# Patient Record
Sex: Male | Born: 1935 | Race: White | Hispanic: No | Marital: Married
Health system: Midwestern US, Community
[De-identification: ages and names within clinical notes are randomized; demographics above are authoritative.]

## PROBLEM LIST (undated history)

## (undated) DIAGNOSIS — A419 Sepsis, unspecified organism: Secondary | ICD-10-CM

## (undated) DIAGNOSIS — E785 Hyperlipidemia, unspecified: Secondary | ICD-10-CM

## (undated) DIAGNOSIS — C61 Malignant neoplasm of prostate: Secondary | ICD-10-CM

## (undated) DIAGNOSIS — I252 Old myocardial infarction: Secondary | ICD-10-CM

## (undated) DIAGNOSIS — N390 Urinary tract infection, site not specified: Secondary | ICD-10-CM

## (undated) DIAGNOSIS — G459 Transient cerebral ischemic attack, unspecified: Secondary | ICD-10-CM

## (undated) DIAGNOSIS — S065XAA Traumatic subdural hemorrhage with loss of consciousness status unknown, initial encounter (HCC): Principal | ICD-10-CM

## (undated) DIAGNOSIS — M25562 Pain in left knee: Secondary | ICD-10-CM

## (undated) HISTORY — DX: Transient cerebral ischemic attack, unspecified: G45.9

## (undated) HISTORY — PX: CORONARY ARTERY BYPASS GRAFT: SHX141

## (undated) HISTORY — DX: Old myocardial infarction: I25.2

## (undated) HISTORY — DX: Sepsis, unspecified organism: A41.9

## (undated) HISTORY — DX: Urinary tract infection, site not specified: N39.0

## (undated) HISTORY — DX: Malignant neoplasm of prostate: C61

## (undated) HISTORY — DX: Hyperlipidemia, unspecified: E78.5

## (undated) LAB — EKG 12-LEAD

---

## 2010-05-30 ENCOUNTER — Inpatient Hospital Stay: Admit: 2010-11-17 | Disposition: A | Discharge status: Home / Self Care | Arrived: WI

## 2010-05-30 LAB — CARDIAC ED PROFILE
BUN: 17 mg/dL (ref 9–23)
CK-MB: 2.1 ng/mL (ref 0.0–5.0)
CO2: 28 mmol/L (ref 20–31)
Calcium: 9.2 mg/dL (ref 8.6–10.5)
Chloride: 109 mmol/L (ref 99–109)
Creatinine: 1 mg/dL (ref 0.7–1.3)
Glucose: 114 mg/dL — ABNORMAL HIGH (ref 70–110)
HCT: 43.5 % (ref 37.0–54.0)
HGB: 14.9 g/dL (ref 12.5–16.5)
MCH: 30.8 pg (ref 26.0–35.0)
MCHC: 34.3 % (ref 32.0–34.5)
MCV: 89.8 fL (ref 80.0–99.9)
MPV: 10.7 fL (ref 7.0–12.0)
Platelets: 135 E9/L (ref 130–450)
Potassium: 3.9 mmol/L (ref 3.5–5.5)
RBC: 4.84 E12/L (ref 3.80–5.80)
RDW: 13.9 fL (ref 11.5–15.0)
Sodium: 142 mmol/L (ref 132–146)
Total CK: 89 U/L (ref 32–294)
Troponin I: 0.01 ng/mL (ref 0.00–0.40)
WBC: 7.9 E9/L (ref 4.5–11.5)

## 2010-05-30 LAB — GFR CALCULATED: Gfr Calculated: >60 mL/min/{1.73_m2} (ref >=60)

## 2010-05-30 MED ORDER — CLOPIDOGREL BISULFATE 75 MG PO TABS
75 MG | ORAL_TABLET | Freq: Every day | ORAL | Status: AC
Start: 2010-05-30 — End: 2011-05-30

## 2010-05-30 MED ORDER — LABETALOL HCL 5 MG/ML IV SOLN
5 MG/ML | Freq: Once | INTRAVENOUS | Status: DC
Start: 2010-05-30 — End: 2010-05-30

## 2010-05-30 MED ADMIN — sodium chloride (PF) 0.9 % injection: @ 14:00:00

## 2010-05-30 MED FILL — NORMAL SALINE FLUSH 0.9 % IJ SOLN: 0.9 % | INTRAMUSCULAR | Qty: 10

## 2010-05-30 NOTE — ED Notes (Signed)
Pt back from Cat Scan. Stable condition    Vaughan Basta, RN  05/30/10 1044

## 2010-05-30 NOTE — Discharge Instructions (Signed)
Transient Ischemic Attack     A transient ischemic attack (TIA) is a "warning stroke" that causes stroke-like symptoms. Unlike a stroke, a TIA does not cause permanent damage to the brain. The symptoms of a TIA can happen very fast and do not last long. It is important to know the symptoms of a TIA and what to do. This can help prevent a major stroke or death.  CAUSES   A TIA is caused by a temporary blockage in an artery in the brain or neck (carotid artery). The blockage does not allow the brain to get the blood supply it needs and can cause different symptoms. The blockage can be caused by either:  l A blood clot.  l Fatty buildup (plaque) in a neck or brain artery.       SYMPTOMS  TIA symptoms are the same as a stroke but are temporary. Symptoms can include sudden:   Numbness or weakness on one side of the body. Especially to the:  l Face.  l Arm.  l Leg.     Trouble speaking, thinking, or confusion.     Change in vision, such as trouble seeing in one or both eyes.     Dizziness, loss of balance, or difficulty walking.   Severe headache.       ANY OF THESE SYMPTOMS MAY REPRESENT A SERIOUS PROBLEM THAT IS AN EMERGENCY. Do not wait to see if the symptoms will go away. Get medical help at once. Call your local emergency service (911 in [place]. DO NOT drive yourself to the hospital.     TIA RISK FACTORS  Risk factors can increase the risk of developing a TIA. These can include.     High blood pressure (hypertension).   High cholesterol (hyperlipidemia).   Heart disease (atherosclerosis).     Smoking.     Diabetes.   Abnormal heart rhythm (atrial fibrillation).   Family history of a stroke or heart attack.     Use of oral contraceptives (especially when combined with smoking).     DIAGNOSIS   A TIA can be diagnosed based on your:  l Symptoms.    l History.  l Risk factors.     Tests that can help diagnose the symptoms of a TIA include:  l CT or MRI scan. These tests can provide detailed  images of the brain.    l Carotid ultrasound. This test looks to see if there are blockages in the carotid arteries of your neck.    l Arteriography. A thin, small flexible tube (catheter) is inserted through a small cut (incision) in your groin. The catheter is threaded to your carotid or vertebral artery. A dye is then injected into the catheter. The dye highlights the arteries in your brain and allows your caregiver to look for narrowing or blockages that can cause a TIA.       TREATMENT  Based on the cause of a TIA, treatment options can vary. Treatment is important to help prevent a stroke. Treatment options can include:   Medication. Such as:  l Clot-busting medicine.    l Anti-platelet medicine.  l Blood pressure medicine.    l Blood thinner medicine.     Surgery:  l Carotid endarterectomy. The carotid arteries are the arteries that supply the head and neck with oxygenated blood. This surgery can help remove fatty deposits (plaque) in the carotid arteries.     Angioplasty and stenting. This surgery uses a balloon to dilate  a blocked artery in the brain. A stent is a small, metal mesh tube that can help keep an artery open.       HOME CARE INSTRUCTIONS   It is important to take all medicine as told by your caregiver. If the medicine has side effects that affect you negatively, tell your caregiver right away. Do not stop taking medicine unless told by your caregiver. Some medicines may need to be changed to better treat your condition.     Do not smoke. Talk to your caregiver on how to quit smoking.     Eat a diet high in fruits, vegetables and lean meat. Avoid a high fat, high salt diet. A dietician can you help you make healthy food choices.     Maintain a healthy weight. Develop an exercise plan approved by your caregiver.       SEEK IMMEDIATE MEDICAL CARE IF:   You develop weakness or numbness on one side of your body.     You have problems thinking, speaking, or feel confused.     You have vision  changes.   You feel dizzy, have trouble walking, or lose your balance.     You develop a severe headache.     MAKE SURE YOU:   Understand these instructions.   Will watch your condition.   Will get help right away if you are not doing well or get worse.     Document Released: 06/07/2005  Document Re-Released: 11/22/2009  [facility] Patient Information 2011 [place].

## 2010-05-30 NOTE — ED Provider Notes (Signed)
HPI Comments: Patient states that he had a 5 min episode of garbled speech on 9/17.  Completely resolved now.  No prior history.  Denies weakness, numbness, vision changes, syncope or difficulty walking.    Patient is a 76 y.o. male presenting with Acute Neurological Problem. The history is provided by the patient and the spouse.   Cerebrovascular Accident  This is a new problem. The current episode started 2 days ago. The problem has been resolved. Associated symptoms include headaches. Pertinent negatives include no chest pain, no abdominal pain and no shortness of breath. Nothing aggravates the symptoms. Nothing relieves the symptoms. He has tried ASA for the symptoms.       Review of Systems   Constitutional: Negative for fever and chills.   HENT: Negative for ear pain, sore throat and sinus pressure.    Eyes: Negative for pain, discharge, redness and visual disturbance.   Respiratory: Negative for cough, shortness of breath and wheezing.    Cardiovascular: Negative for chest pain.   Gastrointestinal: Negative for nausea, vomiting, abdominal pain and diarrhea.   Genitourinary: Negative for dysuria and frequency.   Musculoskeletal: Negative for back pain and arthralgias.   Skin: Negative for rash and wound.   Neurological: Positive for headaches. Negative for weakness.   Hematological: Negative for adenopathy.   All other systems reviewed and are negative.        Physical Exam   Nursing note and vitals reviewed.  Constitutional: He is oriented to person, place, and time. He appears well-developed and well-nourished.   HENT:   Head: Normocephalic and atraumatic.   Eyes: Pupils are equal, round, and reactive to light.   Neck: Normal range of motion. Neck supple.   Cardiovascular: Normal rate, regular rhythm and normal heart sounds.    No murmur heard.  Pulmonary/Chest: Effort normal and breath sounds normal.   Abdominal: Soft. Bowel sounds are normal. No tenderness. He has no rebound and no guarding.    Musculoskeletal: He exhibits no edema.   Neurological: He is alert and oriented to person, place, and time.        Neurovascularly intact.     Skin: Skin is warm and dry.       Procedures    MDM    Labs      Radiology       EKG Interpretation  EKG Interpretation    Interpreted by emergency department physician    Rhythm: sinus bradycardia  Rate: 50-60  Axis: normal  Ectopy: none  Conduction: right bundle branch block (complete)  ST Segments: nonspecific changes  T Waves: non specific changes  Q Waves: III    Clinical Impression: sinus bradycardia,RBBB.  No old for comparison  Time: 11:33 AM    Re-evaluation.  Patient???s symptoms show no change  Repeat physical examination is not changed  Continues to feel well.      Sharee Pimple Avril Busser  --------------------------------------------- PAST HISTORY ---------------------------------------------  Past Medical History: has a past medical history of CAD (coronary artery disease); Cholesterol serum increased; Hyperlipidemia; and ACUTE MI.    Past Surgical History: has past surgical history that includes Cardiac surgery; Coronary angioplasty; and Coronary angioplasty.    Social History:  reports that he has quit smoking. He quit smokeless tobacco use about 14 years ago.  He reports that he does not currently drink alcohol.    Family History: family history is not on file.     The patient???s home medications have been reviewed.    Allergies: Review  of patient's allergies indicates no known allergies.    -------------------------------------------------- RESULTS -------------------------------------------------  Results for orders placed during the hospital encounter of 05/30/10   CARDIAC ED PROFILE       Component Value Range    Sodium 142  132 - 146 (mmol/L)    Potassium 3.9  3.5 - 5.5 (mmol/L)    Chloride 109  99 - 109 (mmol/L)    CO2 28  20 - 31 (mmol/L)    Glucose 114 (*) 70 - 110 (mg/dL)    Bun 17  9 - 23 (mg/dL)    Creatinine 1.0  0.7 - 1.3 (mg/dL)    Calcium 9.2  8.6  - 10.5 (mg/dL)    WBC 7.9  4.5 - 11.5 (E9/L)    RBC 4.84  3.80 - 5.80 (E12/L)    HGB 14.9  12.5 - 16.5 (g/dL)    HCT 43.5  37.0 - 54.0 (%)    MCV 89.8  80.0 - 99.9 (fL)    MCH 30.8  26.0 - 35.0 (pg)    MCHC 34.3  32.0 - 34.5 (%)    RDW 13.9  11.5 - 15.0 (fL)    Platelets 135  130 - 450 (E9/L)    MPV 10.7  7.0 - 12.0 (fL)    Total CK 89  32 - 294 (U/L)    CK-MB 2.1  0.0 - 5.0 (ng/mL)    Troponin I 0.01  0.00 - 0.40 (ng/mL)   GFR CALCULATED       Component Value Range    Gfr Calculated >60  >=60 (ml/mn/1.73)     CT HEAD WO CONTRAST    Final Result:        XR CHEST AP PORTABLE    Final Result:            ------------------------- NURSING NOTES AND VITALS REVIEWED ---------------------------   The nursing notes within the ED encounter and vital signs as below have been reviewed.   BP 144/67   Pulse 57   Temp(Src) 98.1 ??F (36.7 ??C) (Oral)   Resp 16   Ht 6' 2"$  (1.88 m)   Wt 230 lb (104.327 kg)   BMI 29.53 kg/m2   SpO2 95%  Oxygen Saturation Interpretation: Normal      ------------------------------------------ PROGRESS NOTES ------------------------------------------   I have spoken with the patient and discussed today???s results, in addition to providing specific details for the plan of care and counseling regarding the diagnosis and prognosis.  Their questions are answered at this time and they are agreeable with the plan.      --------------------------------- ADDITIONAL PROVIDER NOTES ---------------------------------     Spoke with Dr. Ander Gaster for Dr Everardo Pacific.  Patient will start plavix, return here if there is a worsening or change in mental status or if he experiences stroke like symptoms.  Patient is to call the office today for fu in 2 days.     This patient is stable for discharge.  I have shared the specific conditions for return, as well as the importance of follow-up.        Sharee Pimple Jahsir Rama, DO  05/30/10 1221

## 2011-01-06 ENCOUNTER — Inpatient Hospital Stay
Admit: 2011-01-06 | Disposition: A | Discharge status: Home / Self Care | Source: Home / Self Care | Admitting: Internal Medicine

## 2011-01-06 LAB — BASIC METABOLIC PANEL
BUN: 25 mg/dL — ABNORMAL HIGH (ref 9–23)
CO2: 24 mmol/L (ref 20–31)
Calcium: 8.8 mg/dL (ref 8.6–10.5)
Chloride: 97 mmol/L — ABNORMAL LOW (ref 99–109)
Creatinine: 1.9 mg/dL — ABNORMAL HIGH (ref 0.7–1.3)
Glucose: 145 mg/dL — ABNORMAL HIGH (ref 70–110)
Potassium: 4 mmol/L (ref 3.5–5.5)
Sodium: 132 mmol/L (ref 132–146)

## 2011-01-06 LAB — CK: Total CK: 310 U/L — ABNORMAL HIGH (ref 32–294)

## 2011-01-06 LAB — LACTIC ACID: Lactic Acid: 2.7 mmol/L — ABNORMAL HIGH (ref 0.5–2.2)

## 2011-01-06 LAB — URINALYSIS WITH MICROSCOPIC
Glucose, UA: NEGATIVE mg/dL
Ketones, Urine: NEGATIVE mg/dL
Leukocyte Esterase, Urine: NEGATIVE
Nitrite, Urine: NEGATIVE
Protein, UA: 100 mg/dL — AB
Specific Gravity, UA: >=1.03 (ref <=1.035)
Urobilinogen, Urine: 0.2 E.U./dL (ref 0.2–1.0)
pH, UA: 5 (ref 5.0–8.5)

## 2011-01-06 LAB — PLTDIFFREFLEX
Absolute Baso #: 0.01 E9/L (ref 0.00–0.20)
Absolute Eos #: 0 E9/L — ABNORMAL LOW (ref 0.05–0.50)
Absolute Lymph #: 0.67 E9/L — ABNORMAL LOW (ref 1.50–4.00)
Absolute Mono #: 0.53 E9/L (ref 0.10–0.95)
Absolute Neut #: 8.3 E9/L — ABNORMAL HIGH (ref 1.80–7.30)
Basophils: 0 % (ref 0–2)
Eosinophils %: 0 % (ref 0–6)
Lymphocytes: 7 % — ABNORMAL LOW (ref 20–42)
Monocytes: 6 % (ref 2–12)
Platelet Estimate: DECREASED
Seg Neutrophils: 87 % — ABNORMAL HIGH (ref 43–80)

## 2011-01-06 LAB — CBC
HCT: 44.7 % (ref 37.0–54.0)
HGB: 15.4 g/dL (ref 12.5–16.5)
MCH: 30.4 pg (ref 26.0–35.0)
MCHC: 34.4 % (ref 32.0–34.5)
MCV: 88.3 fL (ref 80.0–99.9)
MPV: 10.5 fL (ref 7.0–12.0)
Platelets: 97 E9/L — ABNORMAL LOW (ref 130–450)
RBC: 5.06 E12/L (ref 3.80–5.80)
RDW: 13.6 fL (ref 11.5–15.0)
WBC: 9.5 E9/L (ref 4.5–11.5)

## 2011-01-06 LAB — APTT: PTT: 28.2 secs. (ref 24.5–34.6)

## 2011-01-06 LAB — LIPASE: Lipase: 30 U/L (ref 6–51)

## 2011-01-06 LAB — TROPONIN: Troponin I: 0.04 ng/mL (ref 0.00–0.40)

## 2011-01-06 LAB — PROTIME-INR
INR: 1.2
Protime: 13 secs. — ABNORMAL HIGH (ref 11.0–12.9)

## 2011-01-06 LAB — HEPATIC FUNCTION PANEL
ALT: 23 U/L (ref 10–49)
AST: 29 U/L (ref 0–33)
Albumin: 4.5 g/dL (ref 3.2–4.8)
Alkaline Phosphatase: 60 U/L (ref 45–129)
Bilirubin, Direct: 0.2 mg/dL (ref 0.0–0.2)
Bilirubin, Total: 0.6 mg/dL (ref 0.3–1.2)
Total Protein: 7.4 g/dL (ref 5.7–8.2)

## 2011-01-06 LAB — ICTOTEST, URINE: Ictotest: NEGATIVE

## 2011-01-06 LAB — POCT GLUCOSE
Glucose: 144 mg/dL
POC Glucose: 144 mg/dL — ABNORMAL HIGH (ref 70–110)

## 2011-01-06 LAB — CK-MB: CK-MB: 4.3 ng/mL (ref 0.0–5.0)

## 2011-01-06 LAB — GFR CALCULATED: Gfr Calculated: 34 mL/min/{1.73_m2} — AB (ref >=60)

## 2011-01-06 MED ORDER — IBUPROFEN 800 MG PO TABS
800 MG | Freq: Once | ORAL | Status: AC
Start: 2011-01-06 — End: 2011-01-06
  Administered 2011-01-06: 18:00:00 via ORAL

## 2011-01-06 MED ORDER — SODIUM CHLORIDE 0.9 % IV BOLUS
0.9 % | Freq: Once | INTRAVENOUS | Status: AC
Start: 2011-01-06 — End: 2011-01-06
  Administered 2011-01-06: 18:00:00 via INTRAVENOUS

## 2011-01-06 MED ADMIN — sodium chloride 0.9% bolus: INTRAVENOUS | @ 21:00:00 | NDC 00338004904

## 2011-01-06 MED ADMIN — sodium chloride 0.9% bolus: INTRAVENOUS | @ 19:00:00 | NDC 00338004904

## 2011-01-06 MED FILL — SODIUM CHLORIDE 0.9 % IV SOLN: 0.9 % | INTRAVENOUS | Qty: 1000

## 2011-01-06 MED FILL — SODIUM CHLORIDE 0.9 % IV SOLN: 0.9 % | INTRAVENOUS | Qty: 500

## 2011-01-06 MED FILL — IBUPROFEN 800 MG PO TABS: 800 MG | ORAL | Qty: 1

## 2011-01-06 NOTE — ED Notes (Signed)
Sleeping at present iv infusing well      Rebecca Eaton, RN  01/06/11 [phone]

## 2011-01-06 NOTE — ED Notes (Signed)
Completed EKG and given to Dr. Su Hilt.    Tyler Rhodes  01/06/11 1356

## 2011-01-06 NOTE — ED Notes (Signed)
Pt resting at present      Farrel Gobble, LPN  78/29/56 2130

## 2011-01-06 NOTE — ED Notes (Addendum)
Rebecca Eaton, RN  01/06/11 [phone]

## 2011-01-06 NOTE — ED Notes (Signed)
Patient to be admitted. Waiting for bed assignment from nursing supervisor.    Verita Lamb, RN  01/06/11 [phone]

## 2011-01-06 NOTE — ED Provider Notes (Signed)
Patient is a 77 y.o. male presenting with fever. The history is provided by the patient, the spouse and medical records. No language interpreter was used.   Fever  Primary symptoms of the febrile illness include fever, fatigue and myalgias. Primary symptoms do not include headaches, cough, wheezing, shortness of breath, abdominal pain, nausea, vomiting, diarrhea, dysuria, altered mental status, arthralgias or rash. The current episode started yesterday. This is a new problem.   The myalgias are not associated with weakness.       Review of Systems   Constitutional: Positive for fever and fatigue. Negative for chills.   HENT: Negative for ear pain, sore throat and sinus pressure.    Eyes: Negative for pain, discharge and redness.   Respiratory: Negative for cough, shortness of breath and wheezing.    Cardiovascular: Negative for chest pain.   Gastrointestinal: Negative for nausea, vomiting, abdominal pain and diarrhea.   Genitourinary: Negative for dysuria and frequency.   Musculoskeletal: Positive for myalgias. Negative for back pain and arthralgias.   Skin: Negative for rash and wound.   Neurological: Negative for weakness and headaches.   Hematological: Negative for adenopathy.   Psychiatric/Behavioral: Negative for altered mental status.   [all other systems reviewed and are negative        Physical Exam   [nursing notereviewed.  Constitutional: He is oriented to person, place, and time. He appears well-developed and well-nourished.   HENT:   Head: Normocephalic and atraumatic.   Eyes: Pupils are equal, round, and reactive to light.   Neck: Normal range of motion. Neck supple.   Cardiovascular: Normal rate, regular rhythm and normal heart sounds.    No murmur heard.  Pulmonary/Chest: Effort normal and breath sounds normal. No respiratory distress. He has no wheezes. He has no rales.   Abdominal: Soft. Bowel sounds are normal. No tenderness. He has no rebound and no guarding.   Musculoskeletal: He exhibits no  edema.   Neurological: He is alert and oriented to person, place, and time. No cranial nerve deficit. Coordination normal.   Skin: Skin is warm and dry.       Procedures    MDM  EKG:  This EKG is signed and interpreted by me.    Rate: 89  Rhythm: Sinus  Interpretation: NSR, left axis, RBBB, Non Specific ST Changes    Comparison: stable as comapred to patient's most recent EKG9/19/11  --------------------------------------------- PAST HISTORY ---------------------------------------------  Past Medical History:  has a past medical history of CAD (coronary artery disease); Cholesterol serum increased; Hyperlipidemia; Acute MI; Prostate cancer; and Prostate cancer.    Past Surgical History:  has past surgical history that includes Cardiac surgery; Coronary angioplasty; and Coronary angioplasty.    Social History:  reports that he has quit smoking. He quit smokeless tobacco use about 14 years ago. He reports that he does not drink alcohol.    Family History: family history is not on file.     The patient???s home medications have been reviewed.    Allergies: Review of patient's allergies indicates no known allergies.    -------------------------------------------------- RESULTS -------------------------------------------------    LABS:  Results for orders placed during the hospital encounter of 01/06/11   BASIC METABOLIC PANEL       Component Value Range    Sodium 132  132 - 146 (mmol/L)    Potassium 4.0  3.5 - 5.5 (mmol/L)    Chloride 97 (*) 99 - 109 (mmol/L)    CO2 24  20 - 31 (mmol/L)  Glucose 145 (*) 70 - 110 (mg/dL)    BUN 25 (*) 9 - 23 (mg/dL)    Creatinine 1.9 (*) 0.7 - 1.3 (mg/dL)    Calcium 8.8  8.6 - 10.5 (mg/dL)   CBC       Component Value Range    WBC 9.5  4.5 - 11.5 (E9/L)    RBC 5.06  3.80 - 5.80 (E12/L)    HGB 15.4  12.5 - 16.5 (g/dL)    HCT 16.1  09.6 - 04.5 (%)    MCV 88.3  80.0 - 99.9 (fL)    MCH 30.4  26.0 - 35.0 (pg)    MCHC 34.4  32.0 - 34.5 (%)    RDW 13.6  11.5 - 15.0 (fL)    Platelets 97 (*) 130 -  450 (E9/L)    MPV 10.5  7.0 - 12.0 (fL)   CK       Component Value Range    Total CK 310 (*) 32 - 294 (U/L)   CK-MB       Component Value Range    CK-MB 4.3  0.0 - 5.0 (ng/mL)   TROPONIN I       Component Value Range    Troponin I 0.04  0.00 - 0.40 (ng/mL)   PROTIME-INR       Component Value Range    Protime 13.0 (*) 11.0 - 12.9 (secs.)    INR 1.2     APTT       Component Value Range    PTT 28.2  24.5 - 34.6 (secs.)   LACTIC ACID, PLASMA       Component Value Range    Lactic Acid 2.7 (*) 0.5 - 2.2 (mmol/L)   HEPATIC FUNCTION PANEL       Component Value Range    Albumin 4.5  3.2 - 4.8 (g/dL)    Alkaline Phosphatase 60  45 - 129 (U/L)    ALT 23  10 - 49 (U/L)    AST 29  0 - 33 (U/L)    Bilirubin, Total 0.6  0.3 - 1.2 (mg/dL)    Bilirubin, Direct 0.2  0.0 - 0.2 (mg/dL)    Total Protein 7.4  5.7 - 8.2 (g/dL)   LIPASE       Component Value Range    Lipase 30  6 - 51 (U/L)   URINALYSIS WITH MICROSCOPIC       Component Value Range    Color, UA YELLOW  YELLOW     Clarity, UA CLOUDY  CLEAR     Glucose, UA NEGATIVE  NEGATIVE (mg/dL)    Bilirubin, Urine SMALL (*) NEGATIVE     Ketones, Urine NEGATIVE  NEGATIVE (mg/dL)    Specific Gravity, UA >=1.030  <=1.035     Blood, Urine LARGE (*) NEGATIVE     pH, UA 5.0  5.0 - 8.5     Protein, UA 100 (*) NEGATIVE (mg/dL)    Urobilinogen, Urine 0.2  0.2 - 1.0 (E.U./dL)    Nitrite, Urine NEGATIVE  NEGATIVE     Leukocyte Esterase, Urine NEGATIVE  NEGATIVE     WBC, UA 0-1  <5 (/hpf)    RBC, UA 1-3  <2 (/hpf)    Bacteria, UA MODERATE (*) NONE (/hpf)    Casts RARE      POCT GLUCOSE       Component Value Range    Glucose 144     GFR CALCULATED       Component Value  Range    Gfr Calculated 34 (*) >=60 (ml/mn/1.73)   PLTDIFFREFLEX       Component Value Range    Absolute Neut # 8.30 (*) 1.80 - 7.30 (E9/L)    Absolute Lymph # 0.67 (*) 1.50 - 4.00 (E9/L)    Absolute Mono # 0.53  0.10 - 0.95 (E9/L)    Absolute Eos # 0.00 (*) 0.05 - 0.50 (E9/L)    Absolute Baso # 0.01  0.00 - 0.20 (E9/L)    Seg  Neutrophils 87 (*) 43 - 80 (%)    Lymphocytes 7 (*) 20 - 42 (%)    Monocytes 6  2 - 12 (%)    Eosinophils 0  0 - 6 (%)    Basophils 0  0 - 2 (%)    Platelet Estimate Decreased Mod      RBC Morphology Norm RBC morph      Platelet Confirmation CONFIRMED     POCT GLUCOSE       Component Value Range    POC Glucose 144 (*) 70 - 110 (mg/dL)   ICTOTEST, URINE       Component Value Range    Ictotest NEGATIVE  NEGATIVE        RADIOLOGY:  Ct Abdomen Pelvis Without Contrast    01/06/2011  "" History- Fever and diarrhea  CT abdomen pelvis without contrast  Findings- Noncontrast spiral CT of the abdomen performed using renal calculus protocol. There is no hydronephrosis. 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 There is a calculus at the left ureterovesical junction which I suspect resides in ureterocele. There are bilateral renal cysts, largest in the upper pole on each side measuring 5.6 cm on the right, and 4.9 cm on the left. Solid organs of the abdomen and retroperitoneum are within normal limits. No adenopathy ascites free air or bowel obstruction. Prostate gland is enlarged. No evidence of diverticulosis diverticulitis or appendicitis.  Impression- 1. 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 2. Bilateral renal cyst.        TranscriptionistKirk Ruths   M.D.      Read ByCaralee Ates   M.D.      Released By- Caralee Ates   M.D.      Released Date Time- 01/06/11 1644  ------------------------------------------------------------------------------     X-ray Chest Ap Portable    01/06/2011  "" History- tremors  Findings- Comparison dated 05/30/2010. Portable chest. Prior CABG. Lungs are clear. The heart mediastinum vascular pattern are within normal limits.  Impression- No active disease the chest.        TranscriptionistKirk Ruths   M.D.      Read ByCaralee Ates   M.D.      Released By- Caralee Ates   M.D.      Released Date  Time- 01/06/11 1418  ------------------------------------------------------------------------------         ------------------------- NURSING NOTES AND VITALS REVIEWED ---------------------------  The nursing notes within the ED encounter and vital signs as below have been reviewed.     Patient Vitals for the past 24 hrs:   BP Temp Temp src Pulse Resp SpO2 Height Weight   01/06/11 1751 115/55 mmHg - - 65  18  97 % - -   01/06/11 1541 118/53 mmHg 99.1 ??F (37.3 ??C) Oral 67  18  98 % - -   01/06/11 1433 131/56 mmHg - -  84  15  95 % - -   01/06/11 1308 204/100 mmHg 100.6 ??F (38.1 ??C) Oral 100  24  98 % 6\' 2"  (1.88 m) 230 lb (104.327 kg)       Oxygen Saturation Interpretation: Normal    ------------------------------------------ PROGRESS NOTES ------------------------------------------  Re-evaluation(s):  Time: 1400  Patient???s symptoms are improving  Repeat physical examination is improved    Counseling:  I have spoken with the patient and discussed today???s results, in addition to providing specific details for the plan of care and counseling regarding the diagnosis and prognosis.  Their questions are answered at this time and they are agreeable with the plan of admission.    --------------------------------- ADDITIONAL PROVIDER NOTES ---------------------------------  Consultations:  . Spoke with Dr. Warren Lacy .  Discussed case.  They will admit the patient.  Spoke with Dr. Tawny Asal  (Urology).  Discussed case.  They will provide consultation.      This patient's ED course included: a personal history and physicial eaxmination, multiple bedside re-evaluations, IV medications, cardiac monitoring, continuous pulse oximetry and complex medical decision making and emergency management    This patient has remained hemodynamically stable during their ED course.    Diagnosis:  1. Urinary tract infection, site not specified    2. Acute renal failure    3. Urolithiasis    4. Fever    5. SIRS (systemic inflammatory response  syndrome)        Disposition:  Patient's disposition: Admit to telemetry  Patient's condition is stable.          Raphael Gibney, DO  Resident  01/06/11 [phone]  ATTENDING PROVIDER ATTESTATION:     I have personally performed and/or participated in the history, exam, medical decision making, and procedures and agree with all pertinent clinical information.      I have also reviewed and agree with the past medical, family and social history unless otherwise noted.    I have discussed this patient in detail with the resident, and provided the instruction and education regarding SIRS.  Initial SIRS criteria.  Pt with history of prostate cancer.  Pt with fever and shaking chills and elevated heart rate initially.  Improved after treatment and vitals improve.  Pt with kidney stone and Fever and Bacteruria.  Pt to be admitted        Guillermina [place], DO  01/07/11 [phone]

## 2011-01-07 LAB — CBC WITH AUTO DIFFERENTIAL
Absolute Baso #: 0.01 E9/L (ref 0.00–0.20)
Absolute Eos #: 0 E9/L — ABNORMAL LOW (ref 0.05–0.50)
Absolute Lymph #: 0.63 E9/L — ABNORMAL LOW (ref 1.50–4.00)
Absolute Mono #: 0.35 E9/L (ref 0.10–0.95)
Absolute Neut #: 8.24 E9/L — ABNORMAL HIGH (ref 1.80–7.30)
Basophils: 0 % (ref 0–2)
Eosinophils %: 0 % (ref 0–6)
HCT: 42.6 % (ref 37.0–54.0)
HGB: 14.7 g/dL (ref 12.5–16.5)
Lymphocytes: 7 % — ABNORMAL LOW (ref 20–42)
MCH: 30.5 pg (ref 26.0–35.0)
MCHC: 34.5 % (ref 32.0–34.5)
MCV: 88.6 fL (ref 80.0–99.9)
MPV: 11.6 fL (ref 7.0–12.0)
Monocytes: 4 % (ref 2–12)
Platelet Estimate: DECREASED
Platelets: 83 E9/L — ABNORMAL LOW (ref 130–450)
RBC: 4.81 E12/L (ref 3.80–5.80)
RDW: 13.8 fL (ref 11.5–15.0)
Seg Neutrophils: 89 % — ABNORMAL HIGH (ref 43–80)
WBC: 9.2 E9/L (ref 4.5–11.5)

## 2011-01-07 LAB — URINALYSIS WITH MICROSCOPIC
Bilirubin, Urine: NEGATIVE
Glucose, UA: NEGATIVE mg/dL
Ketones, Urine: NEGATIVE mg/dL
Leukocyte Esterase, Urine: NEGATIVE
Nitrite, Urine: NEGATIVE
Protein, UA: 100 mg/dL — AB
Specific Gravity, UA: >=1.03 (ref <=1.035)
Urobilinogen, Urine: 0.2 E.U./dL (ref 0.2–1.0)
pH, UA: 5.5 (ref 5.0–8.5)

## 2011-01-07 LAB — BASIC METABOLIC PANEL
BUN: 27 mg/dL — ABNORMAL HIGH (ref 9–23)
CO2: 25 mmol/L (ref 20–31)
Calcium: 8.2 mg/dL — ABNORMAL LOW (ref 8.6–10.5)
Chloride: 103 mmol/L (ref 99–109)
Creatinine: 1.6 mg/dL — ABNORMAL HIGH (ref 0.7–1.3)
Glucose: 116 mg/dL — ABNORMAL HIGH (ref 70–110)
Potassium: 4.5 mmol/L (ref 3.5–5.5)
Sodium: 136 mmol/L (ref 132–146)

## 2011-01-07 LAB — GFR CALCULATED: Gfr Calculated: 42 mL/min/{1.73_m2} — AB (ref >=60)

## 2011-01-07 LAB — PSA, DIAGNOSTIC: Diagnostic Psa: 6.63 ng/mL — ABNORMAL HIGH (ref 0.00–4.00)

## 2011-01-07 LAB — PLATELET CONFIRMATION

## 2011-01-07 MED ORDER — TAMSULOSIN HCL 0.4 MG PO CAPS
0.4 MG | Freq: Every day | ORAL | Status: DC
Start: 2011-01-07 — End: 2011-01-08
  Administered 2011-01-07 – 2011-01-08 (×2): via ORAL

## 2011-01-07 MED ORDER — CLOPIDOGREL BISULFATE 75 MG PO TABS
75 MG | Freq: Every day | ORAL | Status: DC
Start: 2011-01-07 — End: 2011-01-08
  Administered 2011-01-07 – 2011-01-08 (×2): via ORAL

## 2011-01-07 MED ORDER — DEXTROSE 5 % IV SOLN
5 % | INTRAVENOUS | Status: DC
Start: 2011-01-07 — End: 2011-01-08
  Administered 2011-01-07 – 2011-01-08 (×2): via INTRAVENOUS

## 2011-01-07 MED ORDER — SODIUM CHLORIDE 0.9 % IV SOLN
0.9 % | INTRAVENOUS | Status: DC
Start: 2011-01-07 — End: 2011-01-08
  Administered 2011-01-07 – 2011-01-08 (×4): via INTRAVENOUS

## 2011-01-07 MED ORDER — ISOSORBIDE DINITRATE 10 MG PO TABS
10 MG | Freq: Two times a day (BID) | ORAL | Status: DC
Start: 2011-01-07 — End: 2011-01-08
  Administered 2011-01-07 – 2011-01-08 (×3): via ORAL

## 2011-01-07 MED ORDER — CIPROFLOXACIN IN D5W 400 MG/200ML IV SOLN
400 MG/200ML | Freq: Two times a day (BID) | INTRAVENOUS | Status: DC
Start: 2011-01-07 — End: 2011-01-07
  Administered 2011-01-07: 05:00:00 via INTRAVENOUS

## 2011-01-07 MED ORDER — ACETAMINOPHEN 325 MG PO TABS
325 MG | Freq: Four times a day (QID) | ORAL | Status: DC | PRN
Start: 2011-01-07 — End: 2011-01-08
  Administered 2011-01-07 – 2011-01-08 (×6): 650 mg via ORAL

## 2011-01-07 MED ORDER — ASPIRIN 81 MG PO TBEC
81 MG | Freq: Every day | ORAL | Status: DC
Start: 2011-01-07 — End: 2011-01-08
  Administered 2011-01-07 – 2011-01-08 (×2): via ORAL

## 2011-01-07 MED ORDER — SIMVASTATIN 40 MG PO TABS
40 MG | Freq: Every evening | ORAL | Status: DC
Start: 2011-01-07 — End: 2011-01-08
  Administered 2011-01-08: 03:00:00 via ORAL

## 2011-01-07 MED ADMIN — furosemide (LASIX) injection 40 mg: INTRAVENOUS | @ 13:00:00 | NDC 00409610204

## 2011-01-07 MED ADMIN — technetium mertiatide (MAG3) injection 10 milli Curie: INTRAVENOUS | @ 13:00:00

## 2011-01-07 MED ADMIN — sodium chloride (PF) 0.9 % injection: @ 10:00:00

## 2011-01-07 MED ADMIN — ciprofloxacin (CIPRO) IVPB 200 mg: INTRAVENOUS | @ 16:00:00 | NDC 00409477702

## 2011-01-07 MED FILL — ISOSORBIDE DINITRATE 10 MG PO TABS: 10 mg | ORAL | Qty: 2

## 2011-01-07 MED FILL — ASPIRIN ADULT LOW STRENGTH 81 MG PO TBEC: 81 mg | ORAL | Qty: 1

## 2011-01-07 MED FILL — CIPROFLOXACIN IN D5W 400 MG/200ML IV SOLN: 400 MG/200ML | INTRAVENOUS | Qty: 200

## 2011-01-07 MED FILL — CEFTRIAXONE SODIUM 1 G IJ SOLR: 1 g | INTRAMUSCULAR | Qty: 1

## 2011-01-07 MED FILL — CIPROFLOXACIN IN D5W 200 MG/100ML IV SOLN: 200 MG/100ML | INTRAVENOUS | Qty: 100

## 2011-01-07 MED FILL — PLAVIX 75 MG PO TABS: 75 mg | ORAL | Qty: 1

## 2011-01-07 MED FILL — TAMSULOSIN HCL 0.4 MG PO CAPS: 0.4 mg | ORAL | Qty: 1

## 2011-01-07 MED FILL — MAPAP 325 MG PO TABS: 325 mg | ORAL | Qty: 2

## 2011-01-07 MED FILL — CEFTRIAXONE SODIUM-DEXTROSE 1-3.74 GM-% IV SOLR: INTRAVENOUS | Qty: 50

## 2011-01-07 MED FILL — NORMAL SALINE FLUSH 0.9 % IJ SOLN: 0.9 % | INTRAMUSCULAR | Qty: 10

## 2011-01-07 MED FILL — SIMVASTATIN 40 MG PO TABS: 40 mg | ORAL | Qty: 2

## 2011-01-07 NOTE — Progress Notes (Signed)
Nuclear lasix renal scan completed.  Tyler Rhodes

## 2011-01-07 NOTE — H&P (Signed)
Tyler Rhodes, Tyler Rhodes                                 [id number]      ADMITTED:  01/06/2011      HISTORY OF PRESENT ILLNESS:  This is an 77 year old gentleman who presented   to the hospital with a main chief complaint of experiencing back pain, fever,   shaking chills, not feeling well overall for the past few days with   symptomatology progressively getting worse, decreased urination with   foul-smelling urine as well.      PAST MEDICAL HISTORY:  The patient with a previous history of CAD, status   post CABG in the past, history of hyperlipidemia as well and obviously BPH as   well.      REVIEW OF SYSTEMS:  At this time on review of systems, some back pain, fever   and chills, not feeling well overall.  Suprapubic pain and some back pain as   well.  At this time on review of systems, review of systems with some back   pain, fever and chills, not feeling well overall.                                  PHYSICAL EXAMINATION      VITAL SIGNS:  At this time, temperature 100.6 with a pulse of 100,   respirations of 24, and blood pressure 204/100.      GENERAL APPEARANCE:  At this time, the patient is awake, alert, and in no   acute distress.   HENT:  Oropharynx is clear but dry mucosa.      EYES:  Anicteric sclerae.      NECK:  Supple.  No JVD is present.      LUNGS:  Decreased breath sounds.  No rhonchi or wheezes noted.      CARDIOVASCULAR:  Sinus tachy.  No murmurs appreciated.  No JVD is present.      ABDOMEN:  Soft.  Bowel sounds are positive and nontender.  However, some CVA   tenderness appreciated in the left more than the right.      EXTREMITIES:  No edema.      NEUROLOGICAL:  The patient is awake, alert, communicates appropriately and   cognition is very proper as well.      LABORATORY AND X-RAY DATA:  At this time, blood work performed on this   patient did reveal a massive urinary tract infection with systemic   inflammatory response as well.  CAT scan of the pelvis did reveal a 6-mm   calculus on the UVJ junction  on the left side, nonobstructive in character.         CLINICAL IMPRESSION:   At this time, diagnosis of UTI with systemic   inflammatory response as well with kidney stones as well and urgent   hypertension as well.      PLAN:  At this time, we will continue to delineate his management with   treatments of Rocephin and Cipro 400 IV q.12 hours and to monitor his blood   pressure and bring it down to normal numbers.  We will continue to follow.   We will repeat urinalysis to assess the patient's improvement of the   symptomatology that he is currently on.            Dictated  by:  Freeman Caldron, M.D.         Celestia Khat / nmt   DD: 01/06/2011    6:05 P    DT: 01/07/2011  2:19 A   [id number]    [id number]   CC:  Freeman Caldron, M.D.

## 2011-01-07 NOTE — Progress Notes (Signed)
Tyler Rhodes is a 77 y.o. male patient.  1. Urinary tract infection, site not specified    2. Acute renal failure    3. Urolithiasis    4. Fever    5. SIRS (systemic inflammatory response syndrome)      Past Medical History   Diagnosis Date   ??? CAD (coronary artery disease)    ??? Cholesterol serum increased    ??? Hyperlipidemia    ??? Acute MI      pt had mi 1997   ??? Prostate cancer    ??? Prostate cancer      Current Facility-Administered Medications   Medication Dose Route Frequency Provider Last Rate Last Dose   ??? ciprofloxacin (CIPRO) IVPB 200 mg  200 mg Intravenous Q12H Farrel Gobble, DO       ??? aspirin EC tablet 81 mg  81 mg Oral Daily Freeman Caldron, MD   81 mg at 01/07/11 0947   ??? clopidogrel (PLAVIX) tablet 75 mg  75 mg Oral Daily Freeman Caldron, MD   75 mg at 01/07/11 0946   ??? isosorbide dinitrate (ISORDIL) tablet 15 mg  15 mg Oral BID Freeman Caldron, MD   15 mg at 01/07/11 1649   ??? simvastatin (ZOCOR) tablet 80 mg  80 mg Oral Nightly Freeman Caldron, MD       ??? tamsulosin (FLOMAX) capsule 0.4 mg  0.4 mg Oral Daily Freeman Caldron, MD   0.4 mg at 01/07/11 0946   ??? ceftriaxone (ROCEPHIN) 1 G in 50 ML dextrose (vialmate) infusion  1 g Intravenous Q24H Freeman Caldron, MD   1 g at 01/06/11 2230   ??? 0.9%  NaCl infusion  1,000 mL Intravenous Continuous Freeman Caldron, MD 100 mL/hr at 01/07/11 2141 1,000 mL at 01/07/11 2141   ??? acetaminophen (TYLENOL) tablet 650 mg  650 mg Oral Q6H PRN Freeman Caldron, MD   650 mg at 01/07/11 1724     No Known Allergies  Active Problems:   * No active hospital problems. *     Blood pressure 126/60, pulse 72, temperature 98.3 ??F (36.8 ??C), temperature source Oral, resp. rate 20, height 6\' 2"  (1.88 m), weight 230 lb (104.327 kg), SpO2 99.00%.    Subjective:  Symptoms:  Stable.    Diet:  Adequate intake.    Activity level: Normal.    Pain:  He complains of pain that is moderate.  He reports pain is improving.  Pain is well controlled.      Objective:  General Appearance:  Well-appearing.     Vital signs: (most recent): Blood pressure 126/60, pulse 72, temperature 98.3 ??F (36.8 ??C), temperature source Oral, resp. rate 20, height 6\' 2"  (1.88 m), weight 230 lb (104.327 kg), SpO2 99.00%.  Vital signs are normal.    Output: Producing urine and producing stool.    HEENT: Normal HEENT exam.    Lungs:  Normal respiratory rate and normal effort.  Breath sounds clear to auscultation.    Heart: Normal rate.  Regular rhythm.  S1 normal.    Extremities: Normal range of motion.    Neurological: Patient is alert.  GCS score is 15.    Skin:  Warm.    Abdomen:   Bowel sounds:  Bowel sounds are normal.    Pupils:  Pupils are equal, round, and reactive to light.    Pulses: Distal pulses are intact.      Assessment:  (Uti with systemic inflammatory response responding well with iv antibiotics.  Kidney  stones much improved.   htn much better controlled.).       Freeman Caldron  01/07/2011

## 2011-01-07 NOTE — Consults (Signed)
Patient seen and examined.  Consult dictated (job [phone]).

## 2011-01-07 NOTE — Significant Event (Signed)
Patient voided 150 in urinal PVR was 5cc.  Tyler Rhodes

## 2011-01-08 LAB — CBC WITH AUTO DIFFERENTIAL
Absolute Baso #: 0.01 E9/L (ref 0.00–0.20)
Absolute Eos #: 0.02 E9/L — ABNORMAL LOW (ref 0.05–0.50)
Absolute Lymph #: 1.4 E9/L — ABNORMAL LOW (ref 1.50–4.00)
Absolute Mono #: 0.5 E9/L (ref 0.10–0.95)
Absolute Neut #: 3.62 E9/L (ref 1.80–7.30)
Basophils: 0 % (ref 0–2)
Eosinophils %: 0 % (ref 0–6)
HCT: 37.7 % (ref 37.0–54.0)
HGB: 13.1 g/dL (ref 12.5–16.5)
Lymphocytes: 25 % (ref 20–42)
MCH: 30.4 pg (ref 26.0–35.0)
MCHC: 34.6 % — ABNORMAL HIGH (ref 32.0–34.5)
MCV: 87.7 fL (ref 80.0–99.9)
MPV: 11.3 fL (ref 7.0–12.0)
Monocytes: 9 % (ref 2–12)
Platelet Estimate: DECREASED
Platelets: 68 E9/L — ABNORMAL LOW (ref 130–450)
RBC: 4.3 E12/L (ref 3.80–5.80)
RDW: 13.8 fL (ref 11.5–15.0)
Seg Neutrophils: 65 % (ref 43–80)
WBC: 5.6 E9/L (ref 4.5–11.5)

## 2011-01-08 LAB — BASIC METABOLIC PANEL
BUN: 20 mg/dL (ref 9–23)
CO2: 25 mmol/L (ref 20–31)
Calcium: 8 mg/dL — ABNORMAL LOW (ref 8.6–10.5)
Chloride: 104 mmol/L (ref 99–109)
Creatinine: 1.2 mg/dL (ref 0.7–1.3)
Glucose: 107 mg/dL (ref 70–110)
Potassium: 3.8 mmol/L (ref 3.5–5.5)
Sodium: 135 mmol/L (ref 132–146)

## 2011-01-08 LAB — GFR CALCULATED: Gfr Calculated: 58 mL/min/{1.73_m2} — AB (ref >=60)

## 2011-01-08 LAB — PLATELET CONFIRMATION

## 2011-01-08 MED ORDER — CIPROFLOXACIN HCL 500 MG PO TABS
500 MG | ORAL_TABLET | Freq: Two times a day (BID) | ORAL | Status: AC
Start: 2011-01-08 — End: 2011-01-18

## 2011-01-08 MED ADMIN — ciprofloxacin (CIPRO) IVPB 200 mg: INTRAVENOUS | @ 16:00:00 | NDC 25021011482

## 2011-01-08 MED ADMIN — ciprofloxacin (CIPRO) IVPB 200 mg: INTRAVENOUS | @ 05:00:00 | NDC 25021011482

## 2011-01-08 MED FILL — ISOSORBIDE DINITRATE 10 MG PO TABS: 10 MG | ORAL | Qty: 2

## 2011-01-08 MED FILL — TRAMADOL HCL 50 MG PO TABS: 50 MG | ORAL | Qty: 1

## 2011-01-08 MED FILL — MAPAP 325 MG PO TABS: 325 mg | ORAL | Qty: 2

## 2011-01-08 MED FILL — PLAVIX 75 MG PO TABS: 75 MG | ORAL | Qty: 1

## 2011-01-08 MED FILL — CEFTRIAXONE SODIUM 1 G IJ SOLR: 1 g | INTRAMUSCULAR | Qty: 1

## 2011-01-08 MED FILL — SIMVASTATIN 40 MG PO TABS: 40 MG | ORAL | Qty: 2

## 2011-01-08 MED FILL — TAMSULOSIN HCL 0.4 MG PO CAPS: 0.4 MG | ORAL | Qty: 1

## 2011-01-08 MED FILL — MAPAP 325 MG PO TABS: 325 MG | ORAL | Qty: 2

## 2011-01-08 MED FILL — ASPIRIN ADULT LOW STRENGTH 81 MG PO TBEC: 81 MG | ORAL | Qty: 1

## 2011-01-08 MED FILL — CIPROFLOXACIN IN D5W 200 MG/100ML IV SOLN: 200 MG/100ML | INTRAVENOUS | Qty: 100

## 2011-01-08 NOTE — Discharge Instructions (Addendum)
Hydronephrosis -- Adult   Pronounced: high-drow nef-row-sis      Definition   Hydronephrosis occurs when urine builds up in the kidneys and cannot drain out to the bladder. The kidneys swell from the excess urine. The condition may affect one kidney or both. Hydronephrosis is not a disease itself, but rather a sign of another disease or condition affecting the kidneys. Swelling of the kidneys can lead to kidney damage.   This is a serious condition that requires care from your doctor. Most patients who have hydronephrosis will not know it until it has already damaged the kidney. Therefore, if you are at risk for this condition, it is important to be under the care of a physician.     Kidney, Ureter, Bladder, and Kidney Stone        2011 [employer]   Causes   Hydronephrosis is caused by two problems in the urinary system. A blockage may prevent urine from draining out of the kidneys. Or a condition called reflux may cause urine to flow back into the kidneys from the bladder.   Conditions that may cause hydronephrosis include:    A blockage or defect in the urinary system that is present at birth    Kidney stones    A blood clot    Scarring of the ureters (tubes that connect the kidneys to the bladder)    A tumor in the pelvic area (such as bladder, cervix, colon, or prostate)    Enlarged prostate    Enlarged uterus during pregnancy    Persistent urinary infection in the kidneys    Neurogenic bladder    Injury to structures in the urinary system (eg, from surgery or trauma)   Risk Factors   The following factors increase your chances of developing hydronephrosis:    Defect in the urinary system that is present at birth    Cancers in the pelvic area    Pelvic surgery    Blood-clotting disorders    Recurrent urinary tract infections    Enlarged prostate    Neurogenic bladder    Pregnancy   Symptoms   Hydronephrosis may or may not cause any symptoms. If symptoms occur, they may include:     Pain in the back, waist, lower abdomen, or groin     Persistent pain with urination or urinary frequency (from urinary tract infections)     Increased urge to urinate or urinary incontinence     Dribbling after urination     Fever     Nausea and vomiting     Unexplained itching    Diagnosis   Your doctor will ask about your symptoms and medical history, and perform a physical exam, which may involve examination of the pelvis or rectum to feel for blockages. You will likely be referred to a urologist and/or nephrologist for further diagnosis and treatment.   Tests may include:    Urine teststo check for blood, protein, bacteria, or other evidence of damage to the kidneys    Blood teststo check for evidence of damage to the kidneys    Bladder catheterizationa thin tube, called a catheter, inserted into the bladder to try to drain it    Abdominal ultrasound a test that uses sound waves to examine the structures in the abdomen (in this case the kidneys, ureters, and bladder)    Intravenous urogram (or pyelogram)an x-ray test that uses contrast dye to assess the structure and function of the kidneys, bladder, and ureters (  may not be used if kidneys are damaged)    Computed tomography angiography (CTA) a type of x-ray that uses a computer to make pictures of the kidneys, bladder, and ureters    Magnetic resonance imaging (MRI scan) a test that uses magnetic waves to make pictures of the kidneys, bladder, and ureters    Cystoscopy a thin, lighted tube inserted through the urethra and into the bladder to examine the lining    Voiding cystourethrogramx-rays of the bladder and urethra taken during urination after contrast dye is placed in the bladder   Treatment   Treatment involves:    Draining excess urine from the kidney    Removing the blockage    Treating conditions that cause blockage or reflux    Treating infections in the urinary system   Some causes of hydronephrosis resolve without treatment  (such as pregnancy and kidney stones ).   Treatment options include:    Antibiotics to treat urinary tract infections    Medications (eg, anticholinergic drugs) to treat neurogenic bladder    Catheter inserted into the bladder to drain the urine    Nephrostomy (a tube inserted into an opening in the midsection to drain urine from the kidney)    Surgery to remove a blockage or correct a defect in the urinary system    Surgery to remove part or all of the kidney (rare)   Prevention   In general, the causes of hydronephrosis cannot be prevented. Prompt treatment of conditions that cause hydronephrosis reduces the risk of complications, such as kidney failure .     Last Reviewed: September 2010 Samantha Crimes, MD   Updated: 05/31/2009     Your information:  Name: Tyler Rhodes  DOB: [birth year]    Your instructions:    What to do after you leave the hospital:    Recommended diet: renal diet    Recommended activity: activity as tolerated    If you experience any of the following symptoms increased pain, hematuria or fever please follow up with Dr. Lynann Bologna .    Call Dr. Tomasa Hose office ([facility]) for an appointment within 1 week of discharge.         The following personal items were collected during your admission and were returned to you:    Valuables  Dentures: Uppers;Partials  Vision - Corrective Lenses: Glasses  Hearing Aid: Left hearing aid  Jewelry: Watch  Body Piercings Removed: No  Clothing: None  Were All Patient Medications Collected?: Other (comment) (none here)  Other Valuables: None    Information obtained by:  By signing below, I understand that if any problems occur once I leave the hospital I am to contact your pcp.  I understand and acknowledge receipt of the instructions indicated above.

## 2011-01-08 NOTE — Consults (Signed)
Rhodes, Tyler                                 [id number]      DATE OF CONSULTATION:  01/07/2011      CONSULTANT:  Diangelo Radel S. Bobbiejo Ishikawa, M.D.      REQUESTING PHYSICIAN:  Freeman Caldron, M.D.      REASON FOR CONSULTATION:  Possible left distal ureteral calculated, located   prostate adenocarcinoma, trace microscopic hematuria.      HISTORY OF PRESENT ILLNESS:  Tyler Rhodes is a very pleasant 77 year old male who   was admitted yesterday with acute onset of shaking, chills, and rigors.  He   presented to the emergency room and was started empirically on IV Cipro and   Rocephin.  His urine and blood cultures are pending at this time.  He had a   CT scan of his abdomen and pelvis which I reviewed. There is suggestions of a   possible nonobstructing stone in the area of the left ureterovesical junction   measuring 6 mm in length.  There was no hydronephrosis.  There were no renal   calculi.  He did have a MAG-3 Lasix renal scan which showed no sign of   obstruction.  He has bilateral simple cysts.  He did have a slight low back   pain yesterday when his symptoms developed.  He took his wife's Tylenol   arthritis medications and his pain went away.  He has not had any pain since   that time.  He denies any fevers or chills.  He does void comfortably.  He   denies any hematuria, dysuria, or urinary tract infections.  He has no prior   history of stone disease.   Interestingly, he does have a history of   low-grade prostate cancer diagnosed by Dr. Geraldo Docker by prostate biopsy   approximately 3 years ago.  He did not require the Gleason score.  His PSA   rose to as high as around 5 when the biopsy was performed.  He had 2/12   biopsies which were positive for malignancy.  He has been followed annually   and has had no definitive therapy, apparently because of low-grade disease.   His PSA did rise to roughly 8.  His PSA today.  His PSA today, however, is   down to 6.6.  He sees Dr. Theda Sers in approximately 6 months.  He  is taking   Flomax without side effects.  His urinalysis trace amount of microscopic   hematuria.      PAST MEDICAL HISTORY:  His past medical history is significant for coronary   artery disease, hypercholesterolemia, located prostate adenocarcinoma   diagnosed in 2009, hyperlipidemia, myocardial infarction 1997.  He denies any   history of diabetes mellitus.      PAST SURGICAL HISTORY:  He is status post coronary artery bypass graft in   1997, status post previous carotid artery angioplasty with stenting.Marland Kitchen      FAMILY HISTORY:  Family history is noncontributory for general malignancy or   nephrolithiasis.      ALLERGIES:  There are no known drug allergies.      MEDICATIONS:  Tylenol, aspirin, Rocephin, Cipro, Plavix, Isordil, Zocor,   Flomax 0.4 mg p.o. nightly.      SOCIAL HISTORY:  The patient is married and lives with his wife in [place],   [place].  He denies any current alcohol  or tobacco use.  He did smoke cigarettes   but quit in September 1997, roughly a pack a day.  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      REVIEW OF SYSTEMS:  Integument:  He denies any itching, rashes, or easy   bruisability.  Neurologic:  He denies any headache or seizure disorder.   Musculoskeletal:  He denies any joint or back pain currently.   Cardiovascular:  He denies any chest pain or cardiac murmur.  Respiratory:   He denies any shortness of breath or productive cough.  Gastrointestinal:  He   denies any constipation, diarrhea, or melena.  Genitourinary:  He denies any   hematuria, dysuria, stone disease, or incontinence.  Ocular:  He denies any   retinopathy or glaucoma.  ENT:  Denies any sinusitis or epistaxis.   Constitutional:  He denies any vomiting or nausea.  Endocrine:  He denies any   diabetes mellitus history.  Psychiatric:  He denies any depression or   anxiety.      PHYSICAL EXAMINATION:  His temperature is 98.4, pulse 72, respirations 20,   blood  pressure 132/68.  General:  He is a very pleasant 77 year old male in   no acute distress, resting in bed.  HEENT examination:  His neck is supple   without adenopathy or bruits.  His trachea is midline.  Thyroid is normal.   Cardiovascular is regular rate and rhythm.  There are no murmurs.  Lungs are   clear bilaterally.  There are no wheezes, rales or rhonchi.  Abdominal   examination is soft, nontender, with active bowel sounds.  He has no flank or   CVA tenderness.  He has no mass, rebound, rule out guarding.  He has no   suprapubic fullness or discomfort.  He has no evidence of ventral hernia.   His bladder is not palpable.  Extremities show no edema.  He has brisk   peripheral pulses.  Neurologically, he is alert and oriented x3.  He has no   focal motor or sensory deficits.  His deep tendon reflexes are intact.      LABORATORY DATA/X-RAY STUDIES:  His PSA on January 07, 2011, is 6.63.  BUN and   creatinine are 27 and 1.6 respectively.  White blood cell count is 9.2,   hemoglobin and hematocrit 14.7 and 42.6 respectively.  Platelets are 83.  PTT   is 28.2, PT/INR 13 and 1.2 respectively.  Voided urinalysis shows a pH of 5,   large blood, negative ketones, small bilirubin, negative glucose, 3+ protein,   negative nitrate, negative leukocytes, 0-1 white blood cells, 1-3 red blood   cells, moderate bacteria.  Urine culture is pending.      ASSESSMENT:  Tyler Rhodes is a pleasant 77 year old male who was admitted with   rigors and shaking chills.  He has undergone workup for infection of unknown   etiology.  Urologically, he has a history of located prostate cancer   diagnosed by Dr. Geraldo Docker 3 years ago with a relatively stable PSA.  He   has had no definitive therapy to date.  He has very trace microhematuria on   urinalysis.  Because of his smoking history previously, urine cytology will   be obtained.  I did review his CAT scan.  It is unclear as to whether or not   the stone identified by the radiologist is  actually in the ureter or outside.   At this period, his renal scan today certainly shows no evidence of   obstruction.  He is completely asymptomatic.      RECOMMENDATIONS:  I did encourage him to increase fluid intake.  His urine   will be strained.  He will continue on Flomax for voiding issues and this   also should help facilitate stone passage if it is even present.  He has   responded well to antibiotics.  His culture should be finalized within the   next 24 hours.  I think he can manage best without a catheter.  A bladder   scan residual will be monitored to make sure he is emptying his bladder well.   Upon discharge, he will follow up with Dr. Theda Sers to continue with   discussions over his prostate cancer.  I do not feel he requires cystoscopy   at this time, especially given the fact that he is on anticoagulation with   aspirin and Plavix and there is evidence of thrombocytopenia.  He is   therefore stable from my standpoint.  I will continue to follow him closely   during his hospital stay.  Thank you for allowing me to assist in his care.               Dictated by:  Jacqualin Combes Redford Behrle, M.D.                  VR / nmt   DD:  01/07/2011    4:21 P    DT:  01/08/2011  5:03 A   [id number]    [id number]   CC:  Freeman Caldron, M.D.        Jacqualin Combes. Shereena Berquist, M.D.

## 2011-01-09 NOTE — Discharge Summary (Signed)
Tyler Rhodes, Tyler Rhodes                                       [id number]      ADMITTED:  01/06/2011                         DISCHARGED:  01/08/2011      HISTORY OF PRESENT ILLNESS:  This is an 77 year old gentleman who came in to   the hospital with a urinary tract infection, systemic inflammatory disease,   SIRS, and kidney stones as well.  Subsequently, the patient did well with   hydration as well as IV antibiotics.      REVIEW OF SYSTEMS:  Review of systems is negative at this time.      PHYSICAL EXAMINATION:  At this time, the patient is hemodynamically stable   with good vital signs of blood pressure 110/70, respirations 18, temperature   98.6.  HEENT:  Anicteric sclerae.  Oropharynx is clear.  Tympanic membranes   are intact.  Pupils equal and reactive to light.  Minimal CVA tenderness.   Suprapubic nontender.  Extremities have +1 edema.      DIAGNOSES:   1.    At this time, diagnosis of urinary tract infection with systemic      inflammatory response syndrome.   2.    Kidney stones as well.      DISPOSITION:   The patient doing much better, much improved.  To be going   home on Cipro 500 twice a day for the next 10 days.  Follow up with primary   doctor in the next week.      DISCHARGE SUMMARY:  Discharge summary greater than 30 minutes.         Dictated by:  Freeman Caldron, M.D.                  Celestia Khat / nmt   DD: 01/08/2011    8:21 P    DT: 01/09/2011 12:09 P   [id number]    [id number]   CC:   Freeman Caldron, M.D.

## 2012-06-12 ENCOUNTER — Inpatient Hospital Stay
Admit: 2012-06-12 | Discharge: 2012-06-12 | Disposition: A | Discharge status: Home / Self Care | Attending: Emergency Medicine

## 2012-06-12 LAB — CBC WITH AUTO DIFFERENTIAL
Absolute Eos #: 0.2 E9/L (ref 0.05–0.50)
Absolute Lymph #: 2.44 E9/L (ref 1.50–4.00)
Absolute Mono #: 0.76 E9/L (ref 0.10–0.95)
Absolute Neut #: 4.89 E9/L (ref 1.80–7.30)
Basophils Absolute: 0.03 E9/L (ref 0.00–0.20)
Basophils: 0 % (ref 0–2)
Eosinophils %: 2 % (ref 0–6)
Hematocrit: 39.5 % (ref 37.0–54.0)
Hemoglobin: 13.6 g/dL (ref 12.5–16.5)
Lymphocytes: 29 % (ref 20–42)
MCH: 30.4 pg (ref 26.0–35.0)
MCHC: 34.5 % (ref 32.0–34.5)
MCV: 87.9 fL (ref 80.0–99.9)
MPV: 10.6 fL (ref 7.0–12.0)
Monocytes: 9 % (ref 2–12)
Platelets: 142 E9/L (ref 130–450)
RBC: 4.49 E12/L (ref 3.80–5.80)
RDW: 14.1 fL (ref 11.5–15.0)
Seg Neutrophils: 59 % (ref 43–80)
WBC: 8.3 E9/L (ref 4.5–11.5)

## 2012-06-12 LAB — BASIC METABOLIC PANEL
BUN: 16 mg/dL (ref 9–23)
CO2: 28 mmol/L (ref 20–31)
Calcium: 8.9 mg/dL (ref 8.6–10.5)
Chloride: 105 mmol/L (ref 99–109)
Creatinine: 1.2 mg/dL (ref 0.7–1.3)
Glucose: 105 mg/dL (ref 70–110)
Potassium: 4.8 mmol/L (ref 3.5–5.5)
Sodium: 139 mmol/L (ref 132–146)

## 2012-06-12 LAB — GFR CALCULATED: Gfr Calculated: 58 mL/min/{1.73_m2} — AB (ref >=60)

## 2012-06-12 LAB — CK: Total CK: 149 U/L (ref 32–294)

## 2012-06-12 LAB — TROPONIN: Troponin I: 0.01 ng/mL (ref 0.00–0.04)

## 2012-06-12 LAB — CK-MB: CK-MB: 3.1 ng/mL (ref 0.0–5.0)

## 2012-06-12 MED ADMIN — aspirin tablet 325 mg: ORAL | @ 21:00:00 | NDC 66553000101

## 2012-06-12 MED ADMIN — nitroGLYCERIN (NITROSTAT) SL tablet 0.4 mg: 0.4 mg | SUBLINGUAL | @ 22:00:00 | NDC 00071041813

## 2012-06-12 MED FILL — ASPIRIN 325 MG PO TABS: 325 mg | ORAL | Qty: 1

## 2012-06-12 MED FILL — NITROSTAT 0.4 MG SL SUBL: 0.4 mg | SUBLINGUAL | Qty: 25

## 2012-06-12 NOTE — ED Notes (Signed)
Dr Vassie Loll and Dr Irving Burton aware of current blood pressure and are ok with patient being discharged    Janace Litten, RN  06/12/12 1922

## 2012-06-12 NOTE — ED Notes (Signed)
Pt states that he feels good after the nitro    Marella Chimes, RN  06/12/12 [phone]

## 2012-06-12 NOTE — ED Provider Notes (Addendum)
HPI Comments: Patient presents today stating that while at home he had a dizzy spell that lasted approximately 1 minute. He tells me he was at the computer when he felt like the room is spinning. He states that it resolved and he now feels okay there. Denies any headache recurrent dizziness. He does however want to tell me he's had left-sided chest achiness intermittently since yesterday. He states he doesn't feel short of breath but when he takes a deep breath in there is some discomfort. 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 Denies any recent stress or cath. No other complaint    The history is provided by the patient.       Review of Systems   Constitutional: Negative for fever and chills.   HENT: Negative for ear pain, sore throat and sinus pressure.    Eyes: Negative for pain, discharge and redness.   Respiratory: Negative for cough, shortness of breath and wheezing.    Cardiovascular: Positive for chest pain.   Gastrointestinal: Negative for nausea, vomiting, abdominal pain and diarrhea.   Genitourinary: Negative for dysuria and frequency.   Musculoskeletal: Negative for back pain and arthralgias.   Skin: Negative for rash and wound.   Neurological: Positive for dizziness. Negative for weakness and headaches.   Hematological: Negative for adenopathy.     Blood pressure 164/79, pulse 60, temperature 97.9 ??F (36.6 ??C), resp. rate 18, height 6' 3"$  (1.905 m), weight 235 lb (106.595 kg), SpO2 96.00%.      Physical Exam   Nursing note and vitals reviewed.  Constitutional: He is oriented to person, place, and time. He appears well-developed and well-nourished.   HENT:   Head: Normocephalic and atraumatic.   Eyes: Pupils are equal, round, and reactive to light.   Neck: Normal range of motion. Neck supple.   Cardiovascular: Normal rate, regular rhythm and normal heart sounds.    No murmur heard.  Pulmonary/Chest: Effort normal and breath sounds normal. No respiratory distress. He has no wheezes. He  has no rales.   Abdominal: Soft. Bowel sounds are normal. There is no tenderness. There is no rebound and no guarding.   Musculoskeletal: He exhibits no edema.   Neurological: He is alert and oriented to person, place, and time. No cranial nerve deficit. Coordination normal.   Skin: Skin is warm and dry.       Procedures    MDM    --------------------------------------------- PAST HISTORY ---------------------------------------------  Past Medical History:  has a past medical history of CAD (coronary artery disease); Cholesterol serum increased; Hyperlipidemia; Acute MI; Prostate cancer; and Prostate cancer.    Past Surgical History:  has past surgical history that includes Cardiac surgery; Coronary angioplasty; and Coronary angioplasty.    Social History:  reports that he has quit smoking. He quit smokeless tobacco use about 16 years ago. He reports that he does not drink alcohol.    Family History: family history is not on file.     The patient???s home medications have been reviewed.    Allergies: Review of patient's allergies indicates no known allergies.    -------------------------------------------------- RESULTS -------------------------------------------------  Labs:  Results for orders placed during the hospital encounter of 06/12/12   CBC WITH AUTO DIFFERENTIAL       Result Value Range    WBC 8.3  4.5 - 11.5 E9/L    RBC 4.49  3.80 - 5.80 E12/L    Hemoglobin 13.6  12.5 - 16.5 g/dL  Hematocrit 39.5  37.0 - 54.0 %    MCV 87.9  80.0 - 99.9 fL    MCH 30.4  26.0 - 35.0 pg    MCHC 34.5  32.0 - 34.5 %    RDW 14.1  11.5 - 15.0 fL    Platelets 142  130 - 450 E9/L    MPV 10.6  7.0 - 12.0 fL    Absolute Neut # 4.89  1.80 - 7.30 E9/L    Absolute Lymph # 2.44  1.50 - 4.00 E9/L    Absolute Mono # 0.76  0.10 - 0.95 E9/L    Absolute Eos # 0.20  0.05 - 0.50 E9/L    Basophils Absolute 0.03  0.00 - 0.20 E9/L    Seg Neutrophils 59  43 - 80 %    Lymphocytes 29  20 - 42 %    Monocytes 9  2 - 12 %    Eosinophils 2  0 - 6 %     Basophils 0  0 - 2 %   BASIC METABOLIC PANEL       Result Value Range    Sodium 139  132 - 146 mmol/L    Potassium 4.8  3.5 - 5.5 mmol/L    Chloride 105  99 - 109 mmol/L    CO2 28  20 - 31 mmol/L    Glucose 105  70 - 110 mg/dL    BUN 16  9 - 23 mg/dL    Creatinine, Ser 1.2  0.7 - 1.3 mg/dL    Calcium 8.9  8.6 - 10.5 mg/dL   TROPONIN I       Result Value Range    Troponin I 0.01  0.00 - 0.04 ng/mL   CK-MB       Result Value Range    CK-MB 3.1  0.0 - 5.0 ng/mL   CK       Result Value Range    Total CK 149  32 - 294 U/L   GFR CALCULATED       Result Value Range    Gfr Calculated 58 (*) >=60 ml/mn/1.73       Radiology:  Xr Chest Portable    06/12/2012  "" Exam Start [id number] History- chest pain  Comparison- January 06, 2011  Technique- PA and lateral views of the chest were obtained.  Findings- The lungs are clear. No pneumothorax or pleural effusion is seen.The cardiac and mediastinal silhouettes are within normal limits. No bony abnormalities are appreciated.  Impression- 1. No acute disease.         Transcriptionist- JKA D.O.      Read ByVonna Kotyk APGAR D.O.      Released ByVonna Kotyk APGAR D.O.      Released Date Time- 06/12/12 1756  ------------------------------------------------------------------------------       ------------------------- NURSING NOTES AND VITALS REVIEWED ---------------------------  The nursing notes within the ED encounter and vital signs as below have been reviewed.   BP 117/59   Pulse 59   Temp(Src) 97.9 ??F (36.6 ??C)   Resp 20   Ht 6' 3"$  (1.905 m)   Wt 235 lb (106.595 kg)   BMI 29.37 kg/m2   SpO2 96%  Oxygen Saturation Interpretation: Normal      ------------------------------------------ PROGRESS NOTES ------------------------------------------  I have spoken with the patient and discussed today???s results, in addition to providing specific details for the plan of care and counseling regarding the diagnosis and prognosis.  Their questions are answered at this time  and they are agreeable with  the plan.      --------------------------------- ADDITIONAL PROVIDER NOTES ---------------------------------  At this time the patient is without objective evidence of an acute process requiring hospitalization or inpatient management.  They have remained hemodynamically stable throughout their entire ED visit and are stable for discharge with outpatient follow-up.     The plan has been discussed in detail and they are aware of the specific conditions for emergent return, as well as the importance of follow-up.      New Prescriptions    No medications on file       Diagnosis:  1. Dizziness        Disposition:  Patient's disposition: Discharge to home  Patient's condition is stable.    Patient continued to be asymptomatic during his stay here. No chest pain. Patient has no dizziness. Labs and imaging a reviewed with patient and family member. We will let him go home. Return if worsening symptoms and followup in one to 2 days      ATTENDING PROVIDER ATTESTATION:     I have personally performed and/or participated in the history, exam, medical decision making, and procedures and agree with all pertinent clinical information.      I have also reviewed and agree with the past medical, family and social history unless otherwise noted.    I have discussed this patient in detail with the resident, and provided the instruction and education regarding dizziness an vertigo.          Dewitt Hoes, DO  Resident  06/12/12 1911        Charlotte Crumb, DO  07/13/12 1511

## 2012-06-12 NOTE — ED Notes (Addendum)
Marella Chimes, RN  06/12/12 [phone]

## 2012-06-12 NOTE — Discharge Instructions (Signed)

## 2012-06-29 LAB — EKG 12-LEAD: ECG Report: ABNORMAL

## 2014-12-02 LAB — PSA SCREENING: PSA: 5.73 ng/mL — ABNORMAL HIGH (ref 0.00–4.00)

## 2016-04-24 ENCOUNTER — Encounter: Admit: 2016-04-24 | Primary: Internal Medicine

## 2016-04-24 ENCOUNTER — Inpatient Hospital Stay
Admit: 2016-04-24 | Discharge: 2016-04-24 | Disposition: A | Discharge status: Home / Self Care | Attending: Emergency Medicine

## 2016-04-24 DIAGNOSIS — R42 Dizziness and giddiness: Secondary | ICD-10-CM

## 2016-04-24 LAB — CBC WITH AUTO DIFFERENTIAL
Basophils %: 0.6 % (ref 0.0–2.0)
Basophils Absolute: 0.04 E9/L (ref 0.00–0.20)
Eosinophils %: 0.8 % (ref 0.0–6.0)
Eosinophils Absolute: 0.06 E9/L (ref 0.05–0.50)
Hematocrit: 39.2 % (ref 37.0–54.0)
Hemoglobin: 13.1 g/dL (ref 12.5–16.5)
Immature Granulocytes #: 0.04 E9/L
Immature Granulocytes %: 0.6 % (ref 0.0–5.0)
Lymphocytes %: 32.7 % (ref 20.0–42.0)
Lymphocytes Absolute: 2.35 E9/L (ref 1.50–4.00)
MCH: 30 pg (ref 26.0–35.0)
MCHC: 33.4 % (ref 32.0–34.5)
MCV: 89.7 fL (ref 80.0–99.9)
MPV: 12.6 fL — ABNORMAL HIGH (ref 7.0–12.0)
Monocytes %: 7.4 % (ref 2.0–12.0)
Monocytes Absolute: 0.53 E9/L (ref 0.10–0.95)
Neutrophils %: 57.9 % (ref 43.0–80.0)
Neutrophils Absolute: 4.16 E9/L (ref 1.80–7.30)
Platelets: 143 E9/L (ref 130–450)
RBC: 4.37 E12/L (ref 3.80–5.80)
RDW: 13.5 fL (ref 11.5–15.0)
WBC: 7.2 E9/L (ref 4.5–11.5)

## 2016-04-24 LAB — BASIC METABOLIC PANEL
Anion Gap: 11 mmol/L (ref 7–16)
BUN: 22 mg/dL (ref 8–23)
CO2: 28 mmol/L (ref 22–29)
Calcium: 8.8 mg/dL (ref 8.6–10.2)
Chloride: 104 mmol/L (ref 98–107)
Creatinine: 1.1 mg/dL (ref 0.7–1.2)
GFR African American: >60
GFR Non-African American: >60 mL/min/{1.73_m2} (ref >=60)
Glucose: 100 mg/dL (ref 74–109)
Potassium: 4.5 mmol/L (ref 3.5–5.0)
Sodium: 143 mmol/L (ref 132–146)

## 2016-04-24 LAB — URINALYSIS
Bilirubin Urine: NEGATIVE
Glucose, Ur: NEGATIVE mg/dL
Ketones, Urine: NEGATIVE mg/dL
Leukocyte Esterase, Urine: NEGATIVE
Nitrite, Urine: NEGATIVE
Protein, UA: NEGATIVE mg/dL
Specific Gravity, UA: 1.025 (ref 1.005–1.030)
Urobilinogen, Urine: 0.2 E.U./dL (ref <2.0)
pH, UA: 6 (ref 5.0–9.0)

## 2016-04-24 LAB — TROPONIN: Troponin: <0.01 ng/mL (ref 0.00–0.03)

## 2016-04-24 LAB — MICROSCOPIC URINALYSIS

## 2016-04-24 NOTE — ED Notes (Signed)
Lab called re troponin result. They say 5 more min for result     Leanord AsalJohn Karthik Whittinghill, RN  04/24/16 1215

## 2016-04-24 NOTE — ED Provider Notes (Signed)
ED Attending  CC: No  ??  Department of Emergency Medicine   ED  Provider Note  Admit Date/RoomTime: 04/24/2016 10:17 AM  ED Room: 09/09   Chief Complaint   Dizziness (onset this past saturday with progression into today)    History of Present Illness   Source of history provided by:  patient.  History/Exam Limitations: none.       Tyler Rhodes is a 82 y.o. old male who has a past medical history of:   Past Medical History:   Diagnosis Date   ??? Acute MI (HCC)     pt had mi 1997   ??? CAD (coronary artery disease)    ??? Cholesterol serum increased    ??? Hyperlipidemia    ??? Prostate cancer (HCC)    ??? Prostate cancer [facility])     presents to the emergency department by private vehicle, for complaints of dizziness. Patient states it started two days ago. He was walking at a festival, felt off balance and sat down. He states that he never felt lightheaded or had any episodes of syncope. He denies any chest pain or shortness of breath. He states that after he sat down for a moment, he was able to get up and ambulate and did not have return of his dizziness until this morning. He states that he had ambulated to the bathroom, was in the restroom this morning, felt dizzy like he was off balance again and had to sit down. He denies any fall or injury. He denies any recent head injury or visual changes. He denies any dizziness at this time. He continues to deny any chest pain or shortness of breath. He denies any abdominal pain. He had some mild nausea but no vomiting. No diarrhea or constipation. No coughing, fevers or chills. He denies any burning with urination, frequency or urgency.    ROS    Pertinent positives and negatives are stated within HPI, all other systems reviewed and are negative.    Past Surgical History:  has a past surgical history that includes Cardiac surgery; Coronary angioplasty; and Coronary angioplasty.  Social History:  reports that he has quit smoking. He quit smokeless tobacco use about 19 years ago. He  reports that he does not drink alcohol.  Family History: family history is not on file.  Allergies: Review of patient's allergies indicates no known allergies.    Physical Exam           ED Triage Vitals   BP Temp Temp Source Pulse Resp SpO2 Height Weight   04/24/16 1020 04/24/16 1020 04/24/16 1020 04/24/16 1020 04/24/16 1020 04/24/16 1020 04/24/16 1020 04/24/16 1020   163/84 98.7 ??F (37.1 ??C) Oral 55 14 97 %  (1.88 m) 220 lb (99.8 kg)      Oxygen Saturation Interpretation: Normal.    Constitutional:  Level of Consciousness: Awake and alert.       ETOH: No.         Distress: none,  cooperative.   Eyes:  PERRL, EOMI, no discharge or conjunctival injection.  Ears:  External ears without lesions.  Throat:  Pharynx without injection, exudate, or tonsillar hypertrophy.  Airway patient.  Neck:  Normal ROM.  Supple.  Respiratory:  Clear to auscultation and breath sounds equal.  CV:  Regular rate and rhythm, normal heart sounds, without pathological murmurs, ectopy, gallops, or rubs. No pitting edema b/l. 2+ dorsalis pedis pulses b/l.   GI:  Abdomen Soft, nontender, good bowel sounds.  No firm or pulsatile mass.  Back:  No costovertebral tenderness.  Integument:  Normal turgor.  Warm, dry, without visible rash, unless noted elsewhere.  Lymphatic: no lymphadenopathy noted  Neurological:  Oriented.  Motor functions intact. Negative pronator drift. 5 out of 5 grip strength bilaterally. 5/5 strength in the lower extremities bilaterally.    Lab / Imaging Results   (All laboratory and radiology results have been personally reviewed by myself)  Labs:  Results for orders placed or performed during the hospital encounter of 04/24/16   CBC Auto Differential   Result Value Ref Range    WBC 7.2 4.5 - 11.5 E9/L    RBC 4.37 3.80 - 5.80 E12/L    Hemoglobin 13.1 12.5 - 16.5 g/dL    Hematocrit 16.1 09.6 - 54.0 %    MCV 89.7 80.0 - 99.9 fL    MCH 30.0 26.0 - 35.0 pg    MCHC 33.4 32.0 - 34.5 %    RDW 13.5 11.5 - 15.0 fL    Platelets 143  130 - 450 E9/L    MPV 12.6 (H) 7.0 - 12.0 fL    Neutrophils % 57.9 43.0 - 80.0 %    Immature Granulocytes % 0.6 0.0 - 5.0 %    Lymphocytes % 32.7 20.0 - 42.0 %    Monocytes % 7.4 2.0 - 12.0 %    Eosinophils % 0.8 0.0 - 6.0 %    Basophils % 0.6 0.0 - 2.0 %    Neutrophils # 4.16 1.80 - 7.30 E9/L    Immature Granulocytes # 0.04 E9/L    Lymphocytes # 2.35 1.50 - 4.00 E9/L    Monocytes # 0.53 0.10 - 0.95 E9/L    Eosinophils # 0.06 0.05 - 0.50 E9/L    Basophils # 0.04 0.00 - 0.20 E9/L   Basic Metabolic Panel   Result Value Ref Range    Sodium 143 132 - 146 mmol/L    Potassium 4.5 3.5 - 5.0 mmol/L    Chloride 104 98 - 107 mmol/L    CO2 28 22 - 29 mmol/L    Anion Gap 11 7 - 16 mmol/L    Glucose 100 74 - 109 mg/dL    BUN 22 8 - 23 mg/dL    CREATININE 1.1 0.7 - 1.2 mg/dL    GFR Non-African American >60 >=60 mL/min/1.73    GFR African American >60     Calcium 8.8 8.6 - 10.2 mg/dL   Troponin   Result Value Ref Range    Troponin <0.01 0.00 - 0.03 ng/mL   Urinalysis   Result Value Ref Range    Color, UA Yellow Straw/Yellow    Clarity, UA Clear Clear    Glucose, Ur Negative Negative mg/dL    Bilirubin Urine Negative Negative    Ketones, Urine Negative Negative mg/dL    Specific Gravity, UA 1.025 1.005 - 1.030    Blood, Urine TRACE (A) Negative    pH, UA 6.0 5.0 - 9.0    Protein, UA Negative Negative mg/dL    Urobilinogen, Urine 0.2 <2.0 E.U./dL    Nitrite, Urine Negative Negative    Leukocyte Esterase, Urine Negative Negative   Microscopic Urinalysis   Result Value Ref Range    WBC, UA NONE 0 - 5 /HPF    RBC, UA 0-1 0 - 2 /HPF    Epi Cells NONE /HPF    Bacteria, UA NONE /HPF   EKG 12 Lead   Result Value Ref Range  Ventricular Rate 54 BPM    Atrial Rate 54 BPM    P-R Interval 166 ms    QRS Duration 156 ms    Q-T Interval 474 ms    QTc Calculation (Bazett) 449 ms    P Axis 25 degrees    R Axis 8 degrees    T Axis -5 degrees     Imaging:  All Radiology results interpreted by Radiologist unless otherwise noted.  XR Chest Limited One  View   Final Result   1. Stable chest examination demonstrating bilateral lower lobe   fibrosis or chronic discoid atelectasis.   2. Cardiomegaly with CABG changes.      CT Head WO Contrast   Final Result   1. No acute intracranial pathology.   2. Moderate generalized parenchymal volume loss.   3. Diffuse microvascular white matter changes.   4. Chronic left maxillary sinusitis. Moderate mucosal thickening of   the bilateral anterior ethmoid and anterior right sphenoid sinus is   present.        EKG #1:  Interpreted by emergency department physician unless otherwise noted.  Time:  1042    Rate: 54 bpm  Rhythm: Sinus bradycardia. No ST segment elevation. PR int is 166 ms, QRS duration is 156 ms. RBBB.     ED Course / Medical Decision Making   Medications - No data to display  ED Course     Re-Evaluations:  04/24/16      Time: 1215    Patient???s symptoms are improving. He denies any dizziness. Patient ambulated around the ER w/o difficulty.     Consultations:             None    Procedures:   none    MDM:  Patient has no acute findings on urinalysis, labs, CT or x-ray today. Patient is ambulating around the ER without any difficulty. Denies any current dizziness. No chest pain or shortness of breath. We'll discharge him at this time. Advised patient to return to the ER for any worsening symptoms. Otherwise to follow-up with PCP.    ED Attending Note:  I have personally performed and/or participated in the history, exam, medical decision making, and procedures and agree with all pertinent clinical information unless otherwise noted.      I have also reviewed and agree with the past medical, family and social history unless otherwise noted.    I have discussed this patient in detail with the resident/mlp, and provided the instruction and education regarding care and treatment.  My findings/plan:     Subjective:       patient is an 82 year old male with episode of feeling lightheaded at the time of S1 Friday night. He went  to sit down in a nearby table within a few minutes his symptoms resolved completely. The episode this morning of feeling lightheaded as well. No particular causative factors identified. He denies true vertigo or spinning sensation. He denies headache, change medication change in diet dehydration constipation or diarrhea. His past history significant for prostate cancer.    Objective:         Vitals:    04/24/16 1210   BP: (!) 182/70   Pulse: 52   Resp: 16   Temp: 97.5 ??F (36.4 ??C)   SpO2: 96%     General Appearance:    Alert, cooperative, no distress, appears stated age   Head:    Normocephalic, without obvious abnormality, atraumatic   Eyes:    PERRL, conjunctiva/corneas  clear, EOM's intact, fundi    benign, both eyes   Ears:    Normal TM's and external ear canals, both ears   Nose:   Nares normal, septum midline, mucosa normal, no drainage    or sinus tenderness   Throat:   Lips, mucosa, and tongue normal; teeth and gums normal   Neck:   Supple, symmetrical, trachea midline, no adenopathy;    thyroid:  no enlargement/tenderness/nodules; no carotid   bruit or JVD   Back:     Symmetric, no curvature, ROM normal, no CVA tenderness   Lungs:     Clear to auscultation bilaterally, respirations unlabored   Chest Wall:    No tenderness or deformity    Heart:    Regular rate and rhythm, S1 and S2 normal, no murmur, rub   or gallop   Abdomen:     Soft, non-tender, bowel sounds active all four quadrants,    no masses, no organomegaly   Extremities:   Extremities normal, atraumatic, no cyanosis or edema   Pulses:   2+ and symmetric all extremities   Skin:   Skin color, texture, turgor normal, no rashes or lesions   Lymph nodes:   Cervical, supraclavicular, and axillary nodes normal   Neurologic:   CNII-XII intact, normal strength, sensation and reflexes    throughout         Assessment:     Lightheaded        P. Coverdale MD    Counseling:   I have spoken with the spouse and patient and discussed today???s results, in addition  to providing specific details for the plan of care and counseling regarding the diagnosis and prognosis and are agreeable with the plan.     Assessment      1. Dizziness         Plan   Discharge to home.  Patient condition is good.    New Medications     New Prescriptions    No medications on file     Electronically signed by Stevenson ClinchShannon Forgac, PA   DD: 04/24/16  **This report was transcribed using voice recognition software. Every effort was made to ensure accuracy; however, inadvertent computerized transcription errors may be present.  END OF PROVIDER NOTE       Stevenson ClinchShannon Forgac, [place]  04/24/16 1224

## 2016-05-05 LAB — EKG 12-LEAD
Atrial Rate: 54 {beats}/min
P Axis: 25 degrees
P-R Interval: 166 ms
Q-T Interval: 474 ms
QRS Duration: 156 ms
QTc Calculation (Bazett): 449 ms
R Axis: 8 degrees
T Axis: -5 degrees
Ventricular Rate: 54 {beats}/min

## 2017-02-22 ENCOUNTER — Emergency Department: Admit: 2017-02-22 | Payer: MEDICARE | Primary: Internal Medicine

## 2017-02-22 ENCOUNTER — Inpatient Hospital Stay
Admit: 2017-02-22 | Discharge: 2017-02-22 | Disposition: A | Discharge status: Home / Self Care | Payer: MEDICARE | Attending: Emergency Medicine

## 2017-02-22 DIAGNOSIS — S46912A Strain of unspecified muscle, fascia and tendon at shoulder and upper arm level, left arm, initial encounter: Secondary | ICD-10-CM

## 2017-02-22 MED ORDER — DEXAMETHASONE SOD PHOSPHATE PF 10 MG/ML IJ SOLN
10 MG/ML | Freq: Once | INTRAMUSCULAR | Status: AC
Start: 2017-02-22 — End: 2017-02-22
  Administered 2017-02-22: 19:00:00 10 mg via INTRAMUSCULAR

## 2017-02-22 MED ORDER — MELOXICAM 15 MG PO TABS
15 MG | ORAL_TABLET | Freq: Every day | ORAL | 0 refills | Status: DC
Start: 2017-02-22 — End: 2017-05-09

## 2017-02-22 MED ORDER — KETOROLAC TROMETHAMINE 30 MG/ML IJ SOLN
30 MG/ML | Freq: Once | INTRAMUSCULAR | Status: AC
Start: 2017-02-22 — End: 2017-02-22
  Administered 2017-02-22: 19:00:00 30 mg via INTRAMUSCULAR

## 2017-02-22 MED FILL — DEXAMETHASONE SOD PHOSPHATE PF 10 MG/ML IJ SOLN: 10 MG/ML | INTRAMUSCULAR | Qty: 1

## 2017-02-22 MED FILL — KETOROLAC TROMETHAMINE 30 MG/ML IJ SOLN: 30 MG/ML | INTRAMUSCULAR | Qty: 1

## 2017-02-22 NOTE — ED Provider Notes (Signed)
The history is provided by the patient.   Shoulder Problem   Location:  Shoulder  Shoulder location:  L shoulder  Injury: yes    Time since incident:  1 week  Mechanism of injury comment:  Pain after lifting a heavy chair  Pain details:     Quality:  Aching    Severity:  Moderate  Associated symptoms: no back pain and no fever        Review of Systems   Constitutional: Negative for chills and fever.   HENT: Negative for ear pain, sinus pressure and sore throat.    Eyes: Negative for pain, discharge and redness.   Respiratory: Negative for cough, shortness of breath and wheezing.    Cardiovascular: Negative for chest pain.   Gastrointestinal: Negative for abdominal pain, diarrhea, nausea and vomiting.   Genitourinary: Negative for dysuria and frequency.   Musculoskeletal: Negative for arthralgias and back pain.   Skin: Negative for rash and wound.   Neurological: Negative for weakness and headaches.   Hematological: Negative for adenopathy.   All other systems reviewed and are negative.      Physical Exam   Constitutional: He is oriented to person, place, and time. He appears well-developed and well-nourished.   HENT:   Head: Normocephalic and atraumatic.   Eyes: Pupils are equal, round, and reactive to light.   Neck: Normal range of motion. Neck supple.   Cardiovascular: Normal rate, regular rhythm and normal heart sounds.    No murmur heard.  Pulmonary/Chest: Effort normal and breath sounds normal. No respiratory distress. He has no wheezes. He has no rales.   Abdominal: Soft. Bowel sounds are normal. There is no tenderness. There is no rebound and no guarding.   Musculoskeletal: He exhibits no edema.        Left shoulder: He exhibits decreased range of motion and tenderness.   Neurological: He is alert and oriented to person, place, and time. No cranial nerve deficit. Coordination normal.   Skin: Skin is warm and dry.   Nursing note and vitals reviewed.      Procedures    MDM        --------------------------------------------- PAST HISTORY ---------------------------------------------  Past Medical History:  has a past medical history of Acute MI; CAD (coronary artery disease); Cholesterol serum increased; Hyperlipidemia; Prostate cancer (HCC); and Prostate cancer (HCC).    Past Surgical History:  has a past surgical history that includes Cardiac surgery; Coronary angioplasty; and Coronary angioplasty.    Social History:  reports that he has quit smoking. He quit smokeless tobacco use about 20 years ago. He reports that he does not drink alcohol.    Family History: family history is not on file.     The patient's home medications have been reviewed.    Allergies: Patient has no known allergies.    -------------------------------------------------- RESULTS -------------------------------------------------  Labs:  No results found for this visit on 02/22/17.    Radiology:  XR SHOULDER LEFT (MIN 2 VIEWS)   Final Result   1. No acute fracture is identified.   2. Mild acromioclavicular and glenohumeral joint arthrosis.          ------------------------- NURSING NOTES AND VITALS REVIEWED ---------------------------  Date / Time Roomed:  02/22/2017  2:22 PM  ED Bed Assignment:  18/18    The nursing notes within the ED encounter and vital signs as below have been reviewed.   BP (!) 158/72   Pulse 72   Temp 97.9 F (36.6 C) (Oral)  Resp 14   Ht 6\' 2"  (1.88 m)   Wt 225 lb (102.1 kg)   SpO2 96%   BMI 28.89 kg/m   Oxygen Saturation Interpretation: Normal      ------------------------------------------ PROGRESS NOTES ------------------------------------------  I have spoken with the patient and discussed today's results, in addition to providing specific details for the plan of care and counseling regarding the diagnosis and prognosis.  Their questions are answered at this time and they are agreeable with the plan. I discussed at length with them reasons for immediate return here for re  evaluation. They will followup with primary care by calling their office tomorrow.    Medications   ketorolac (TORADOL) injection 30 mg (30 mg Intramuscular Given 02/22/17 1507)   dexamethasone (PF) (DECADRON) injection 10 mg (10 mg Intramuscular Given 02/22/17 1507)         --------------------------------- ADDITIONAL PROVIDER NOTES ---------------------------------  At this time the patient is without objective evidence of an acute process requiring hospitalization or inpatient management.  They have remained hemodynamically stable throughout their entire ED visit and are stable for discharge with outpatient follow-up.     The plan has been discussed in detail and they are aware of the specific conditions for emergent return, as well as the importance of follow-up.      New Prescriptions    MELOXICAM (MOBIC) 15 MG TABLET    Take 1 tablet by mouth daily       Diagnosis:  1. Shoulder strain, left, initial encounter    2. Osteoarthritis of left shoulder, unspecified osteoarthritis type        Disposition:  Patient's disposition: Discharge to home  Patient's condition is stable.              Delma Officer, MD  02/22/17 [phone]

## 2017-02-22 NOTE — ED Notes (Signed)
The pt states he has begun to get some relief of the pain after he was medicated.     Christene SlatesMichael A Beatrice Ziehm, RN  02/22/17 [phone]

## 2017-03-09 ENCOUNTER — Encounter

## 2017-03-12 ENCOUNTER — Ambulatory Visit: Admit: 2017-03-12 | Discharge: 2017-03-12 | Payer: MEDICARE | Primary: Internal Medicine

## 2017-03-12 ENCOUNTER — Ambulatory Visit
Admit: 2017-03-12 | Discharge: 2017-03-12 | Payer: MEDICARE | Attending: Orthopaedic Surgery | Primary: Internal Medicine

## 2017-03-12 DIAGNOSIS — M25562 Pain in left knee: Secondary | ICD-10-CM

## 2017-03-12 DIAGNOSIS — M170 Bilateral primary osteoarthritis of knee: Secondary | ICD-10-CM

## 2017-03-12 MED ORDER — TRIAMCINOLONE ACETONIDE 40 MG/ML IJ SUSP
40 MG/ML | Freq: Once | INTRAMUSCULAR | Status: AC
Start: 2017-03-12 — End: 2017-03-12
  Administered 2017-03-12: 20:00:00 40 mg via INTRA_ARTICULAR

## 2017-03-12 NOTE — Progress Notes (Signed)
Chief Complaint   Patient presents with   . Knee Pain     Bilateral knee pain for a couple years. Equal bilaterally, No previous treatment.       Subjective:     Patient ID: Tyler Rhodes is a 83 y.o.. Caucasian male    Knee Pain  Patient complains of bilateral knee pain. This is evaluated as a personal injury. There was not a history of injury.   The pain began several years ago. The pain is located anterior. He describes  His symptoms as aching. He has not experienced popping, clicking, locking, and giving way in the affected knee.  The patient has had pain with kneeling, squating, and climbing stairs.  Symptoms improve with rest. The symptoms are worse with getting up from a chair. The knee has not given out or felt unstable. The patient can bend and straighten the knee fully.  The patient is active in none. Treatment to date has been nothing specific, without significant relief. The patient is not working.     Past Medical History:   Diagnosis Date   . Acute MI     pt had mi 1997   . CAD (coronary artery disease)    . Cholesterol serum increased    . Hyperlipidemia    . Prostate cancer (HCC)    . Prostate cancer [facility])      Past Surgical History:   Procedure Laterality Date   . CARDIAC SURGERY     . CORONARY ANGIOPLASTY      pt had a triple by pass 1997   . CORONARY ANGIOPLASTY      pt had cat 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       Current Outpatient Prescriptions:   .  meloxicam (MOBIC) 15 MG tablet, Take 1 tablet by mouth daily, Disp: 30 tablet, Rfl: 0  .  clopidogrel (PLAVIX) 75 MG tablet, Take 75 mg by mouth daily, Disp: , Rfl:   .  simvastatin (ZOCOR) 80 MG tablet, Take 80 mg by mouth nightly.  , Disp: , Rfl:   .  tamsulosin (FLOMAX) 0.4 MG capsule, Take 0.4 mg by mouth daily.  , Disp: , Rfl:   .  Aspirin (ASPIR-81 PO), Take  by mouth.  , Disp: , Rfl:   No Known Allergies  Social History     Social History   . Marital status: Married     Spouse name: N/A   . Number of children: N/A   . Years of education: N/A      Occupational History   . Not on file.     Social History Main Topics   . Smoking status: Former Games developer   . Smokeless tobacco: Former Neurosurgeon     Quit date: 05/30/1996   . Alcohol use No   . Drug use: Unknown   . Sexual activity: Not on file     Other Topics Concern   . Not on file     Social History Narrative   . No narrative on file     No family history on file.      REVIEW OF SYSTEMS:     General/Constitution:  (-)weight loss, (-)fever, (-)chills, (-)weakness.   Skin: (-) rash,(-) psoriasis,(-) eczema, (-)skin cancer.   Musculoskeletal: (-) fractures,  (-) dislocations,(-) collagen vascular disease, (-) fibromyalgia, (-) multiple sclerosis, (-) muscular dystrophy, (-) RSD,(-) joint pain (-)swelling, (-) joint pain,swelling.  Neurologic: (-) epilepsy, (-)seizures,(-) brain tumor,(-) TIA, (-)stroke, (-)headaches, (-)Parkinson disease,(-) memory loss, (-)  LOC.  Cardiovascular: (-) Chest pain, (-) swelling in legs/feet, (-) SOB, (-) cramping in legs/feet with walking.  Respiratory: (-) SOB, (-) Coughing, (-) night sweats.  GI: (-) nausea, (-) vomiting, (-) diarrhea, (-) blood in stool, (-) gastric ulcer.  Psychiatric: (-) Depression, (-) Anxiety, (-) bipolar disease, (-) Alzheimer's Disease  Allergic/Immunologic: (-) allergies latex, (-) allergies metal, (-) skin sensitivity.  Hematlogic: (-) anemia, (-) blood transfusion, (-) DVT/PE, (-) Clotting disorders      Subjective:    Constitution:  Ht. 6 ft 2 in. , Wt. 225 lbs.    Psycihatric:  The patient is alert and oriented x 3, appears to be stated age and in no distress.      Respiratory:  Respiratory effort is not labored.  Patient is not gasping.  Palpation of the chest reveals no tactile fremitus.    Skin:  Upon inspection: the skin appears warm, dry and intact.  There is  a previous scar over the affected area.There is any cellulitis, lymphedema or cutaneous lesions noted in the lower extremities.   Upon palpation there is no induration noted.       Neurologic:  Gait: normal;  Motor exam of the lower extremities show ; quadriceps, hamstrings, foot dorsi and plantar flexors intact R.  5/5 and L. 5/5. Deep tendon reflexes are 2/4 at the knees and 2/4 at the ankles with strong extensor hallicus longus motor strength bilaterally. Sensory to both feet is intact to all sensory roots.    Cardiovascular:  The vascular exam is normal and is well perfused to distal extremities.  Distal pulses DP/PT: R. 2+; L. 2+.  There is cap refill noted less than two seconds in all digits. There is not edema of the bilateral lower extremities.  There is not varicosities noted in the distal extremities.      Lymph:  Upon palpation,  there is no lymphadenopathy noted in bilateral lower extremities.      Musculoskeletal:  Gait: antalgic; examination of the nails and digits reveal no cyanosis or clubbing.    Lumbar exam:  On visual inspection, there is not deformity of the spine.   full range of motion, no tenderness, palpable spasm or pain on motion. Special tests: Straight Leg Raise negative, Faber test negative.      Hip exam:   Upon inspection, there is not deformity noted.  Upon palpation there is not tenderness.  ROM: is  full and symmetrical.   Strength: Hip Flexors 5/5; Hip Abductors 5/5; Hip Adduction 5/5.     Knee exam:  Bilateral knee exam shows;  range of motion of R. Knee is 0 to 120, and L. Knee is 0 to 120. The patient does have  pain on motion, effusion is mild, there is tenderness over the  patellar region, there are not any masses, there is not ligamentous instability, there is not  deformity noted.    Knee exam: bilateral positive for moderate crepitations, some mild tenderness laxity is not noted with any stress.  There is not a popliteal cyst.    R. Knee:  Lachman's negative, Anterior Drawer negative, Posterior Drawer negative  McMurray's negative, Thallasy  negative,   PF grind test positive, Apprehension test negative, Patellar J sign  negative  L.  Knee:  Lachman's negative, Anterior Drawer negative, Posterior Drawer negative  McMurray's negative, Thallasy  negative,   PF grind test positive, Apprehension test negative,  Patellar J sign  negative    Xray Exam:  Moderate tricompartmental degenerative  changes are noted,   greatest in the anterior compartment. There is no joint effusion.   There is normal alignment. The patella is well located. No fractures   are identified.       Radiographic findings reviewed with patient    Assessment:  Encounter Diagnoses   Name Primary?   . Primary osteoarthritis of both knees Yes       Plan:  Natural history and expected course discussed. Questions answered.  Transport planner distributed.  Rest, ice, compression, and elevation (RICE) therapy.  Reduction in offending activity.   I will proceed with a cortisone injection in the Bilateral knees.  Verbal and written consent was obtained for the injections.  Skin was prepped with alcohol and betadine.  1 ml of Kenalog 40mg  and 9 ml of 0.25% Marcaine was  injected to Bilateral knees.   The injections were given through the lateral side of the knees.  The patient tolerated the injections well. I will see the patient back as needed

## 2017-05-09 ENCOUNTER — Emergency Department: Admit: 2017-05-09 | Payer: MEDICARE | Primary: Internal Medicine

## 2017-05-09 ENCOUNTER — Inpatient Hospital Stay
Admission: EM | Admit: 2017-05-09 | Discharge: 2017-05-11 | Disposition: A | Discharge status: Skilled Nursing Facility | Payer: MEDICARE | Admitting: Internal Medicine

## 2017-05-09 DIAGNOSIS — A419 Sepsis, unspecified organism: Principal | ICD-10-CM

## 2017-05-09 LAB — CBC WITH AUTO DIFFERENTIAL
Basophils %: 0.5 % (ref 0.0–2.0)
Basophils Absolute: 0.06 E9/L (ref 0.00–0.20)
Eosinophils %: 1.5 % (ref 0.0–6.0)
Eosinophils Absolute: 0.19 E9/L (ref 0.05–0.50)
Hematocrit: 36.3 % — ABNORMAL LOW (ref 37.0–54.0)
Hemoglobin: 12 g/dL — ABNORMAL LOW (ref 12.5–16.5)
Immature Granulocytes #: 0.19 E9/L
Immature Granulocytes %: 1.5 % (ref 0.0–5.0)
Lymphocytes %: 30.9 % (ref 20.0–42.0)
Lymphocytes Absolute: 3.8 E9/L (ref 1.50–4.00)
MCH: 27.7 pg (ref 26.0–35.0)
MCHC: 33.1 % (ref 32.0–34.5)
MCV: 83.8 fL (ref 80.0–99.9)
MPV: 11.7 fL (ref 7.0–12.0)
Monocytes %: 11.4 % (ref 2.0–12.0)
Monocytes Absolute: 1.4 E9/L — ABNORMAL HIGH (ref 0.10–0.95)
Neutrophils %: 54.2 % (ref 43.0–80.0)
Neutrophils Absolute: 6.67 E9/L (ref 1.80–7.30)
Platelets: 268 E9/L (ref 130–450)
RBC: 4.33 E12/L (ref 3.80–5.80)
RDW: 15.5 fL — ABNORMAL HIGH (ref 11.5–15.0)
WBC: 12.3 E9/L — ABNORMAL HIGH (ref 4.5–11.5)

## 2017-05-09 LAB — BASIC METABOLIC PANEL
Anion Gap: 11 mmol/L (ref 7–16)
BUN: 21 mg/dL (ref 8–23)
CO2: 25 mmol/L (ref 22–29)
Calcium: 8.5 mg/dL — ABNORMAL LOW (ref 8.6–10.2)
Chloride: 105 mmol/L (ref 98–107)
Creatinine: 0.9 mg/dL (ref 0.7–1.2)
GFR African American: >60
GFR Non-African American: >60 mL/min/{1.73_m2} (ref >=60)
Glucose: 101 mg/dL (ref 74–109)
Potassium: 4 mmol/L (ref 3.5–5.0)
Sodium: 141 mmol/L (ref 132–146)

## 2017-05-09 LAB — URINALYSIS WITH MICROSCOPIC
Bilirubin Urine: NEGATIVE
Glucose, Ur: NEGATIVE mg/dL
Ketones, Urine: NEGATIVE mg/dL
Nitrite, Urine: POSITIVE — AB
Protein, UA: NEGATIVE mg/dL
Specific Gravity, UA: 1.01 (ref 1.005–1.030)
Urobilinogen, Urine: 0.2 E.U./dL (ref <2.0)
pH, UA: 6 (ref 5.0–9.0)

## 2017-05-09 LAB — EKG 12-LEAD
Atrial Rate: 78 {beats}/min
P Axis: 72 degrees
P-R Interval: 156 ms
Q-T Interval: 434 ms
QRS Duration: 152 ms
QTc Calculation (Bazett): 494 ms
T Axis: -8 degrees
Ventricular Rate: 78 {beats}/min

## 2017-05-09 LAB — TROPONIN: Troponin: <0.01 ng/mL (ref 0.00–0.03)

## 2017-05-09 LAB — LACTIC ACID: Lactic Acid: 0.9 mmol/L (ref 0.5–2.2)

## 2017-05-09 MED ORDER — CHLORHEXIDINE GLUCONATE 0.12 % MT SOLN
0.12 % | Freq: Every evening | OROMUCOSAL | Status: DC
Start: 2017-05-09 — End: 2017-05-11
  Administered 2017-05-10 – 2017-05-11 (×2): 15 mL via OROMUCOSAL

## 2017-05-09 MED ORDER — CLOPIDOGREL BISULFATE 75 MG PO TABS
75 MG | Freq: Every day | ORAL | Status: DC
Start: 2017-05-09 — End: 2017-05-11
  Administered 2017-05-09 – 2017-05-11 (×3): 75 mg via ORAL

## 2017-05-09 MED ORDER — NORMAL SALINE FLUSH 0.9 % IV SOLN
0.9 % | Freq: Two times a day (BID) | INTRAVENOUS | Status: DC
Start: 2017-05-09 — End: 2017-05-11
  Administered 2017-05-09 – 2017-05-11 (×4): 10 mL via INTRAVENOUS

## 2017-05-09 MED ORDER — TAMSULOSIN HCL 0.4 MG PO CAPS
0.4 MG | Freq: Every evening | ORAL | Status: DC
Start: 2017-05-09 — End: 2017-05-11
  Administered 2017-05-09 – 2017-05-10 (×2): 0.4 mg via ORAL

## 2017-05-09 MED ORDER — ACETAMINOPHEN 325 MG PO TABS
325 MG | Freq: Once | ORAL | Status: AC
Start: 2017-05-09 — End: 2017-05-09
  Administered 2017-05-09: 11:00:00 650 mg via ORAL

## 2017-05-09 MED ORDER — FISH OIL 1200 MG PO CAPS
1200 MG | Freq: Every day | ORAL | Status: DC
Start: 2017-05-09 — End: 2017-05-09

## 2017-05-09 MED ORDER — SIMVASTATIN 40 MG PO TABS
40 MG | Freq: Every day | ORAL | Status: DC
Start: 2017-05-09 — End: 2017-05-11
  Administered 2017-05-09 – 2017-05-11 (×3): 80 mg via ORAL

## 2017-05-09 MED ORDER — LOSARTAN POTASSIUM 50 MG PO TABS
50 MG | Freq: Every day | ORAL | Status: DC
Start: 2017-05-09 — End: 2017-05-11
  Administered 2017-05-10 – 2017-05-11 (×2): 100 mg via ORAL

## 2017-05-09 MED ORDER — NORMAL SALINE FLUSH 0.9 % IV SOLN
0.9 % | INTRAVENOUS | Status: DC | PRN
Start: 2017-05-09 — End: 2017-05-11

## 2017-05-09 MED ORDER — ACETAMINOPHEN 325 MG PO TABS
325 MG | ORAL | Status: DC | PRN
Start: 2017-05-09 — End: 2017-05-11
  Administered 2017-05-11: 16:00:00 650 mg via ORAL

## 2017-05-09 MED ORDER — STERILE WATER FOR INJECTION (MIXTURES ONLY)
1 g | INTRAMUSCULAR | Status: DC
Start: 2017-05-09 — End: 2017-05-11
  Administered 2017-05-10 – 2017-05-11 (×2): 1 g via INTRAVENOUS

## 2017-05-09 MED ORDER — ASPIRIN 81 MG PO TBEC
81 MG | Freq: Every day | ORAL | Status: DC
Start: 2017-05-09 — End: 2017-05-11
  Administered 2017-05-09 – 2017-05-11 (×3): 81 mg via ORAL

## 2017-05-09 MED ORDER — NITROGLYCERIN 0.4 MG SL SUBL
0.4 MG | SUBLINGUAL | Status: DC | PRN
Start: 2017-05-09 — End: 2017-05-11

## 2017-05-09 MED ORDER — STERILE WATER FOR INJECTION (MIXTURES ONLY)
2 g | Freq: Once | INTRAMUSCULAR | Status: AC
Start: 2017-05-09 — End: 2017-05-09
  Administered 2017-05-09: 11:00:00 2 g via INTRAVENOUS

## 2017-05-09 MED ORDER — ENOXAPARIN SODIUM 40 MG/0.4ML SC SOLN
40 MG/0.4ML | Freq: Every day | SUBCUTANEOUS | Status: DC
Start: 2017-05-09 — End: 2017-05-11
  Administered 2017-05-11: 13:00:00 40 mg via SUBCUTANEOUS

## 2017-05-09 MED FILL — CHLORHEXIDINE GLUCONATE 0.12 % MT SOLN: 0.12 % | OROMUCOSAL | Qty: 15

## 2017-05-09 MED FILL — LOSARTAN POTASSIUM 50 MG PO TABS: 50 MG | ORAL | Qty: 2

## 2017-05-09 MED FILL — CEFTRIAXONE SODIUM 2 G IJ SOLR: 2 g | INTRAMUSCULAR | Qty: 2

## 2017-05-09 MED FILL — MAPAP 325 MG PO TABS: 325 MG | ORAL | Qty: 2

## 2017-05-09 MED FILL — ENOXAPARIN SODIUM 40 MG/0.4ML SC SOLN: 40 MG/0.4ML | SUBCUTANEOUS | Qty: 0.4

## 2017-05-09 MED FILL — CLOPIDOGREL BISULFATE 75 MG PO TABS: 75 MG | ORAL | Qty: 1

## 2017-05-09 MED FILL — SODIUM CHLORIDE FLUSH 0.9 % IV SOLN: 0.9 % | INTRAVENOUS | Qty: 10

## 2017-05-09 MED FILL — TAMSULOSIN HCL 0.4 MG PO CAPS: 0.4 MG | ORAL | Qty: 1

## 2017-05-09 MED FILL — SIMVASTATIN 40 MG PO TABS: 40 MG | ORAL | Qty: 2

## 2017-05-09 MED FILL — STERILE WATER FOR INJECTION IJ SOLN: INTRAMUSCULAR | Qty: 20

## 2017-05-09 MED FILL — ASPIRIN EC 81 MG PO TBEC: 81 MG | ORAL | Qty: 1

## 2017-05-09 NOTE — Plan of Care (Signed)
Problem: Pain:  Goal: Pain level will decrease  Pain level will decrease   Outcome: Ongoing    Goal: Control of acute pain  Control of acute pain   Outcome: Ongoing    Goal: Control of chronic pain  Control of chronic pain   Outcome: Ongoing

## 2017-05-09 NOTE — ED Provider Notes (Signed)
The patient is an 83yo male who presents to the ED via EMS for the chief complaint of right knee pain. Onset started a few days ago. The patient and wife report frequent falls lately. He fell yesterday backwards. He denies hitting his head or LOC. He is on Plavix. He complains of pain to his right knee. Wife reports he has been fatigued lately. He also complains of low back pain. Patient denies any shortness of breath.       The history is provided by the patient and the spouse.       Review of Systems   Constitutional: Positive for fatigue. Negative for chills and fever.        Frequent falls   HENT: Negative for ear pain and sore throat.    Eyes: Negative for pain, discharge and redness.   Respiratory: Negative for cough, shortness of breath and wheezing.    Cardiovascular: Negative for chest pain.   Gastrointestinal: Negative for abdominal pain, diarrhea, nausea and vomiting.   Genitourinary: Negative for dysuria and frequency.   Musculoskeletal: Positive for arthralgias, back pain and gait problem.   Skin: Negative for rash and wound.   Neurological: Negative for dizziness, weakness, light-headedness and headaches.   All other systems reviewed and are negative.      BP (!) 176/89    Pulse 71    Temp 97.6 ??F (36.4 ??C) (Oral)    Resp 15    Ht 6\' 2"  (1.88 m)    Wt 225 lb (102.1 kg)    SpO2 95%    BMI 28.89 kg/m??     Physical Exam   Constitutional: He is oriented to person, place, and time. He appears well-developed and well-nourished. No distress.   83yo male laying in bed in no acute distress   HENT:   Head: Normocephalic and atraumatic.   Nose: Nose normal.   Mouth/Throat: Oropharynx is clear and moist. No oropharyngeal exudate.   Eyes: Pupils are equal, round, and reactive to light. Conjunctivae are normal. Right eye exhibits no discharge. Left eye exhibits no discharge.   Neck: Normal range of motion. Neck supple. No JVD present.   Cardiovascular: Normal rate, regular rhythm and normal heart sounds.    No  murmur heard.  Pulmonary/Chest: Effort normal and breath sounds normal. No stridor. No respiratory distress. He has no wheezes. He has no rales.   Normal effort. No distress.   Abdominal: Soft. Bowel sounds are normal. There is no tenderness. There is no rebound and no guarding.   Musculoskeletal: He exhibits no edema.        Right knee: He exhibits swelling and effusion. He exhibits normal range of motion. Tenderness found.        Cervical back: He exhibits no tenderness, no bony tenderness, no swelling, no edema and no deformity.        Thoracic back: He exhibits no tenderness, no bony tenderness, no swelling and no edema.        Lumbar back: He exhibits no tenderness, no bony tenderness, no swelling and no edema.   Lymphadenopathy:     He has no cervical adenopathy.   Neurological: He is alert and oriented to person, place, and time. No cranial nerve deficit.   He is awake, alert and oriented. No focal neuro deficit.   Skin: Skin is warm and dry. He is not diaphoretic.   Nursing note and vitals reviewed.      Procedures    MDM  ED Course as of May 10 437   Wed May 09, 2017   0804 Spoke with Dr. Tinnie Gens she will accept the patient for admission.  [MT]      ED Course User Index  [MT] Bronson Curb, DO         EKG Interpretation    Interpreted by emergency department physician    Rhythm: normal sinus   Rate: 78  Axis: normal  Ectopy: none  Conduction: right bundle branch block (complete)  ST Segments: nonspecific changes  T Waves: non specific changes  Q Waves: nonspecific    Clinical Impression: Normal sinus rhythm with complete RBBB. Compared with prior EKG on 04-24-16 and there is no significant change.    Darra Lis [facility]      ED Course as of May 10 437   Wed May 09, 2017   1610 Spoke with Dr. Tinnie Gens she will accept the patient for admission.  [MT]      ED Course User Index  [MT] Bronson Curb, DO       --------------------------------------------- PAST HISTORY  ---------------------------------------------  Past Medical History:  has a past medical history of Acute MI (HCC); CAD (coronary artery disease); Cholesterol serum increased; Hyperlipidemia; Prostate cancer (HCC); and Prostate cancer (HCC).    Past Surgical History:  has a past surgical history that includes Cardiac surgery; Coronary angioplasty; and Coronary angioplasty.    Social History:  reports that he has quit smoking. He has a 10.00 pack-year smoking history. He quit smokeless tobacco use about 20 years ago. He reports that he does not drink alcohol or use drugs.    Family History: family history includes Heart Disease in his father; Stroke in his mother.     The patient???s home medications have been reviewed.    Allergies: Patient has no known allergies.    -------------------------------------------------- RESULTS -------------------------------------------------    LABS:  Results for orders placed or performed during the hospital encounter of 05/09/17   CBC auto differential   Result Value Ref Range    WBC 12.3 (H) 4.5 - 11.5 E9/L    RBC 4.33 3.80 - 5.80 E12/L    Hemoglobin 12.0 (L) 12.5 - 16.5 g/dL    Hematocrit 96.0 (L) 37.0 - 54.0 %    MCV 83.8 80.0 - 99.9 fL    MCH 27.7 26.0 - 35.0 pg    MCHC 33.1 32.0 - 34.5 %    RDW 15.5 (H) 11.5 - 15.0 fL    Platelets 268 130 - 450 E9/L    MPV 11.7 7.0 - 12.0 fL    Neutrophils % 54.2 43.0 - 80.0 %    Immature Granulocytes % 1.5 0.0 - 5.0 %    Lymphocytes % 30.9 20.0 - 42.0 %    Monocytes % 11.4 2.0 - 12.0 %    Eosinophils % 1.5 0.0 - 6.0 %    Basophils % 0.5 0.0 - 2.0 %    Neutrophils # 6.67 1.80 - 7.30 E9/L    Immature Granulocytes # 0.19 E9/L    Lymphocytes # 3.80 1.50 - 4.00 E9/L    Monocytes # 1.40 (H) 0.10 - 0.95 E9/L    Eosinophils # 0.19 0.05 - 0.50 E9/L    Basophils # 0.06 0.00 - 0.20 E9/L   Basic Metabolic Panel   Result Value Ref Range    Sodium 141 132 - 146 mmol/L    Potassium 4.0 3.5 - 5.0 mmol/L    Chloride 105 98 - 107  mmol/L    CO2 25 22 - 29 mmol/L     Anion Gap 11 7 - 16 mmol/L    Glucose 101 74 - 109 mg/dL    BUN 21 8 - 23 mg/dL    CREATININE 0.9 0.7 - 1.2 mg/dL    GFR Non-African American >60 >=60 mL/min/1.73    GFR African American >60     Calcium 8.5 (L) 8.6 - 10.2 mg/dL   Troponin   Result Value Ref Range    Troponin <0.01 0.00 - 0.03 ng/mL   Urinalysis with Microscopic   Result Value Ref Range    Color, UA Straw Straw/Yellow    Clarity, UA SLCLOUDY Clear    Glucose, Ur Negative Negative mg/dL    Bilirubin Urine Negative Negative    Ketones, Urine Negative Negative mg/dL    Specific Gravity, UA 1.010 1.005 - 1.030    Blood, Urine TRACE (A) Negative    pH, UA 6.0 5.0 - 9.0    Protein, UA Negative Negative mg/dL    Urobilinogen, Urine 0.2 <2.0 E.U./dL    Nitrite, Urine POSITIVE (A) Negative    Leukocyte Esterase, Urine MODERATE (A) Negative    WBC, UA 10-20 (A) 0 - 5 /HPF    RBC, UA 5-10 (A) 0 - 2 /HPF    Epi Cells RARE /HPF    Bacteria, UA MANY (A) /HPF   EKG 12 Lead   Result Value Ref Range    Ventricular Rate 78 BPM    Atrial Rate 78 BPM    P-R Interval 156 ms    QRS Duration 152 ms    Q-T Interval 434 ms    QTc Calculation (Bazett) 494 ms    P Axis 72 degrees    T Axis -8 degrees       RADIOLOGY:  CT Head WO Contrast   Final Result   No CT evidence of acute intracranial injury.         XR CHEST 1 VW   Final Result   No acute abnormality.      XR LUMBAR SPINE (2-3 VIEWS)   Final Result   Multilevel degenerative disc and degenerative joint   disease.         XR KNEE RIGHT (1-2 VIEWS)   Final Result   1. No fracture.   2. Chondrocalcinosis and patellofemoral arthritis.          ------------------------- NURSING NOTES AND VITALS REVIEWED ---------------------------  Date / Time Roomed:  05/09/2017  5:06 AM  ED Bed Assignment:  08/08    The nursing notes within the ED encounter and vital signs as below have been reviewed.     Patient Vitals for the past 24 hrs:   BP Temp Temp src Pulse Resp SpO2 Height Weight   05/09/17 0702 (!) 176/89 - - 71 15 95 % - -    05/09/17 0649 (!) 196/89 97.6 ??F (36.4 ??C) Oral 71 17 96 % - -   05/09/17 0617 (!) 178/90 - - 69 15 - - -   05/09/17 0545 (!) 190/98 - - 77 19 - - -   05/09/17 0515 (!) 202/92 - - 87 24 - - -   05/09/17 0510 (!) 204/151 97.8 ??F (36.6 ??C) Oral 81 26 98 % 6\' 2"  (1.88 m) 225 lb (102.1 kg)       Oxygen Saturation Interpretation: Normal    ------------------------------------------ PROGRESS NOTES ------------------------------------------  Re-evaluation(s):  Time: 5784.  Patient???s symptoms show no change  Repeat physical examination is not changed    Counseling:  I have spoken with the patient and discussed today???s results, in addition to providing specific details for the plan of care and counseling regarding the diagnosis and prognosis.  Their questions are answered at this time and they are agreeable with the plan of admission.    --------------------------------- ADDITIONAL PROVIDER NOTES ---------------------------------  Consultations:  Time: Spoke with Dr. Tinnie Gensostom.  Discussed case.  They will admit the patient.  This patient's ED course included: a personal history and physicial examination, re-evaluation prior to disposition, multiple bedside re-evaluations, IV medications, cardiac monitoring and continuous pulse oximetry    This patient has remained hemodynamically stable during their ED course.    Diagnosis:  1. Altered mental status, unspecified altered mental status type    2. Urinary tract infection without hematuria, site unspecified    3. Acute pain of right knee    4. Frequent falls    5. Sepsis, due to unspecified organism [facility](HCC)        Disposition:  Patient's disposition: Admit to telemetry  Patient's condition is stable.         Ladon Applebaumarren Jaclene Bartelt, [place]DO  05/10/17 [phone]

## 2017-05-09 NOTE — H&P (Signed)
History and Physical      CHIEF COMPLAINT:  Feeling weak and multiple falls    History of Present Illness: an 83 years old white gentelman who had multiple falls,came to er was found to have sepsis,so he was admitted for iv antibiotics,also pt has swelling of rt knee so we will get xray and consult ortho      Past Medical History:   Diagnosis Date   . Acute MI (HCC)     pt had mi 1997   . CAD (coronary artery disease)    . Cholesterol serum increased    . Hyperlipidemia    . Prostate cancer (HCC)    . Prostate cancer [facility](HCC)        Past Surgical History:   Procedure Laterality Date   . CARDIAC SURGERY     . CORONARY ANGIOPLASTY      pt had a triple by pass 1997   . CORONARY ANGIOPLASTY      pt had cat 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       Medications Prior to Admission:    Prescriptions Prior to Admission: aspirin 81 MG tablet, Take 81 mg by mouth daily  nitroGLYCERIN (NITROSTAT) 0.4 MG SL tablet, Place 0.4 mg under the tongue every 5 minutes as needed for Chest pain up to max of 3 total doses. If no relief after 1 dose, call 911.  Omega-3 Fatty Acids (FISH OIL) 1200 MG CAPS, Take 1,200 mg by mouth daily  chlorhexidine (PERIDEX) 0.12 % solution, Take 15 mLs by mouth nightly  clopidogrel (PLAVIX) 75 MG tablet, Take 75 mg by mouth daily  simvastatin (ZOCOR) 80 MG tablet, Take 80 mg by mouth daily   tamsulosin (FLOMAX) 0.4 MG capsule, Take 0.4 mg by mouth every evening     Allergies:    Patient has no known allergies.    Social History:    reports that he has quit smoking. He has a 10.00 pack-year smoking history. He quit smokeless tobacco use about 20 years ago. He reports that he does not drink alcohol or use drugs.    Family History:   family history includes Heart Disease in his father; Stroke in his mother.    REVIEW OF SYSTEMS:  As above in the HPI, otherwise negative.    PHYSICAL EXAM:    Vitals:  BP (!) 184/80   Pulse 68   Temp 97.8 F (36.6 C) (Oral)   Resp 14   Ht 6\' 2"  (1.88 m)   Wt 224 lb 6.4 oz (101.8 kg)    SpO2 98%   BMI 28.81 kg/m     General:  Awake, alert, oriented X 3.  Well developed, well nourished, well groomed.  No apparent distress.  HEENT:  Normocephalic, atraumatic.  Pupils equal, round, reactive to light.  No scleral icterus.  No conjunctival injection.  Normal lips, teeth, and gums.  No nasal discharge.  Neck:  Supple  Heart:  RRR, no murmurs, gallops, or rubs  Lungs:  CTA bilaterally, bilat symmetrical expansion, no wheeze, rales, or rhonchi  Abdomen:  Bowel sounds present, soft, nontender, no masses, no organomegaly, no peritoneal signs  Extremities:  No clubbing, cyanosis, or edema,rt knee effusion  Skin:  Warm and dry, no open lesions or rash  Neuro:  Cranial nerves 2-12 intact, no focal deficits  Breast: deferred  Rectal: deferred  Genitalia:  deferred    LABS:  CBC:   Lab Results   Component Value Date    WBC 12.3  05/09/2017    RBC 4.33 05/09/2017    HGB 12.0 05/09/2017    HCT 36.3 05/09/2017    MCV 83.8 05/09/2017    MCH 27.7 05/09/2017    MCHC 33.1 05/09/2017    RDW 15.5 05/09/2017    PLT 268 05/09/2017    MPV 11.7 05/09/2017     CMP:    Lab Results   Component Value Date    NA 141 05/09/2017    K 4.0 05/09/2017    CL 105 05/09/2017    CO2 25 05/09/2017    BUN 21 05/09/2017    CREATININE 0.9 05/09/2017    GFRAA >60 05/09/2017    LABGLOM >60 05/09/2017    GLUCOSE 101 05/09/2017    GLUCOSE 107 01/08/2011    PROT 7.4 01/06/2011    LABALBU 4.5 01/06/2011    CALCIUM 8.5 05/09/2017    BILITOT 0.6 01/06/2011    ALKPHOS 60 01/06/2011    AST 29 01/06/2011    ALT 23 01/06/2011         ASSESSMENT:    Sepsis  uti  Rt knee effusion  Weakness  htn    PLAN:  Start iv antibiotics,consult ortho,pt and ot,awaiting cultures.,control bp        Please note that over 50 minutes was spent in evaluating the patient, review of records and results, discussion with staff/family, etc.      Electronically signed by Caleen Jobs, MD on 05/09/2017 at 5:27 PM

## 2017-05-09 NOTE — ED Notes (Signed)
Breakfast tray ordered at this time.      Ceasar Lundhrista M Reeve, RN  05/09/17 [phone]

## 2017-05-09 NOTE — ED Notes (Signed)
The patient was transported to CT and then the patient will go to xray.      Tyler EisenmengerFelicia Marie Dayn Barich, RN  05/09/17 [phone]

## 2017-05-09 NOTE — ED Notes (Signed)
The patient's spouse explained that the patient has not been able to ambulate for several days but was unwilling to seek medical treatment, he was not even getting up out of his chair to void, Dr. Alycia Pattenembroski notified.      Tyler EisenmengerFelicia Marie Simisola Sandles, RN  05/09/17 [phone]

## 2017-05-09 NOTE — ED Notes (Signed)
Attempted to ambulate patient. Patient stood up with walker, took a few steps, and was unable to continue d/t pain. Patient reports he does not feel like he can be discharged d/t pain and being unable to ambulate around home. Patient also has steps to get into house.       Ceasar Lundhrista M Reeve, RN  05/09/17 [phone]

## 2017-05-09 NOTE — ED Notes (Signed)
Tele called for pt pick-up.     Verdis FredericksonJames P Raneshia Derick, RN  05/09/17 [phone]

## 2017-05-09 NOTE — Consults (Signed)
Department of Orthopedic Surgery  Resident Consult Note          CHIEF COMPLAINT:  Right knee pain    HISTORY OF PRESENT ILLNESS:                The patient is a 83 y.o. male who presents with right knee pain after fall from standing height 1 day ago. The patient states that he was walking and then misstepped over a ledge and all his weight 100 his right knee buckled on him place him on the ground. He denies a loss of consciousness. Denies fever or chills He denies any numbness or paresthesias. Since the fall he has been able to ambulate with a cane. States he does not have a lot of pain with palpation simply range of motion. Denies any clicking or popping or mechanical symptoms. States he has been seen in the past by Dr. Joseph Art and recently had a steroid injection in July. Cc does take aspirin and a Plavix roll for cardiac    Past Medical History:        Diagnosis Date   ??? Acute MI (HCC)     pt had mi 1997   ??? CAD (coronary artery disease)    ??? Cholesterol serum increased    ??? Hyperlipidemia    ??? Prostate cancer (HCC)    ??? Prostate cancer [facility])      Past Surgical History:        Procedure Laterality Date   ??? CARDIAC SURGERY     ??? CORONARY ANGIOPLASTY      pt had a triple by pass 1997   ??? CORONARY ANGIOPLASTY      pt had cat 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     Current Medications:   Current Facility-Administered Medications: sodium chloride flush 0.9 % injection 10 mL, 10 mL, Intravenous, 2 times per day  sodium chloride flush 0.9 % injection 10 mL, 10 mL, Intravenous, PRN  acetaminophen (TYLENOL) tablet 650 mg, 650 mg, Oral, Q4H PRN  enoxaparin (LOVENOX) injection 40 mg, 40 mg, Subcutaneous, Daily  aspirin EC tablet 81 mg, 81 mg, Oral, Daily  chlorhexidine (PERIDEX) 0.12 % solution 15 mL, 15 mL, Mouth/Throat, Nightly  clopidogrel (PLAVIX) tablet 75 mg, 75 mg, Oral, Daily  nitroGLYCERIN (NITROSTAT) SL tablet 0.4 mg, 0.4 mg, Sublingual, Q5 Min PRN  simvastatin (ZOCOR) tablet 80 mg, 80 mg, Oral, Daily  tamsulosin (FLOMAX)  capsule 0.4 mg, 0.4 mg, Oral, QPM  [START ON 05/10/2017] cefTRIAXone (ROCEPHIN) 1 g in sterile water 10 mL IV syringe, 1 g, Intravenous, Q24H  losartan (COZAAR) tablet 100 mg, 100 mg, Oral, Daily  Allergies:  Patient has no known allergies.    Social History:   TOBACCO:   reports that he has quit smoking. He has a 10.00 pack-year smoking history. He quit smokeless tobacco use about 20 years ago.  ETOH:   reports that he does not drink alcohol.  DRUGS:   reports that he does not use drugs.  ACTIVITIES OF DAILY LIVING:    OCCUPATION:    Family History:   Family History   Problem Relation Age of Onset   ??? Stroke Mother    ??? Heart Disease Father        General ROS: negative  Cardiovascular ROS: no chest pain or dyspnea on exertion  Respiratory ROS: no cough, shortness of breath, or wheezing  Gastrointestinal ROS: no abdominal pain, change in bowel habits, or black or bloody stools  Neurological ROS: no  TIA or stroke symptoms  Musculoskeletal ROS: Knee pain    PHYSICAL EXAM:    VITALS:  BP (!) 184/80    Pulse 68    Temp 97.8 ??F (36.6 ??C) (Oral)    Resp 14    Ht 6\' 2"  (1.88 m)    Wt 224 lb 6.4 oz (101.8 kg)    SpO2 98%    BMI 28.81 kg/m??   CONSTITUTIONAL:  awake, alert, cooperative, no apparent distress, and appears stated age  MUSCULOSKELETAL:  RLE  ?? Skin intact  ?? Positive effusion  ?? Palpable defect  ?? Extensor mechanism intact  ?? Negative Lockman and drawer testing  ?? Negative varus valgus instability  ?? No pain with palpation  ?? No visible pain with passive range of motion to flexion of 90??. He rests at -10?? of full extension  ?? Compartments and soft and compressible  ?? Sensation intact to light touch to deep peroneal, superficial peroneal, posterior tibial, sural, saphenous nerves  ?? +2 DP, PT pulses  ?? Has intact range of motion plantarflexion, dorsiflexion, EHL  ?? No pain with log roll    SECONDARY EXAM    LUE: skin intact, -TTP, Radial pulses +2, cap refill <2 seconds, +sensation to radial/ulnar/median nerves  sensation, +motor to AIN/PIN/ulnar nerves, compartments soft and compressible    RUE: skin intact, -TTP, Radial pulses +2, cap refill <2 seconds, +sensation to radial/ulnar/median nerves sensation, +motor to AIN/PIN/ulnar nerves, compartments soft and compressible    BJY:NWGNLLE:skin intack, -TTP, DP/PT pulses +2, cap refill < 2 seconds, + sensation to sp/dp/pt/s/s nerves intact, demonstrates active plantar and dorsiflexion of ankle, compartments soft and compressible        DATA:    CBC:   Lab Results   Component Value Date    WBC 12.3 05/09/2017    RBC 4.33 05/09/2017    HGB 12.0 05/09/2017    HCT 36.3 05/09/2017    MCV 83.8 05/09/2017    MCH 27.7 05/09/2017    MCHC 33.1 05/09/2017    RDW 15.5 05/09/2017    PLT 268 05/09/2017    MPV 11.7 05/09/2017     PT/INR:    Lab Results   Component Value Date    PROTIME 13.0 01/06/2011    INR 1.2 01/06/2011     Radiology Review:      X-ray right knee: No acute fractures or dislocations. Moderate to severe tricompartmental arthritis. Positive chondrocalcinosis and medial and lateral compartments. Positive effusion    IMPRESSION:  ?? Right knee effusion secondary to acute inflammatory response from fall versus hemarthrosis event the patient is on antiplatelet agents  ?? At this point very low suspicion for septic knee given history of fall and platelet agents. As well as source for sepsis from the UTI.    PLAN:  ?? Weightbearing tolerated right knee  ?? PT/OT  ?? Ice as needed  ?? Compression  ?? Elevated as able  ?? Recommend oral NSAIDs per medicine given patient's cardiac history  ?? No acute orthopedic intervention at this time  ?? Ortho will follow to see how patient progresses  ?? If effusion progresses. Will possibly drain at later date  ?? Agree with above  ?? Patient was seen and examined

## 2017-05-09 NOTE — Care Coordination-Inpatient (Signed)
Discharge Planning:    Met with pt & pt's wife, in the ED regarding discharge planning. Pt lives with his wife, in a 1 story home, with 2 steps to get into the home. DME's pt owns are a walker & shower chair. Pt has never received HHC and never been to a rehabilitation facility. Pt is independent at home and does drive. Pt's wife is able to assist him at home. PCP is Dr. Elliot Gault. Pharmacy pt prefers is [facility] on [address]. Pt & pt's wife relayed that they would like to wait to decide on discharge planning. Physician please order PT/OT evaluation when medically appropriate to assist with discharge planning. Thank you. SW will follow.    Electronically signed by Jannifer Franklin, LSW on 05/09/2017 at 8:51 AM

## 2017-05-10 LAB — CBC WITH AUTO DIFFERENTIAL
Basophils %: 0.6 % (ref 0.0–2.0)
Basophils Absolute: 0.07 E9/L (ref 0.00–0.20)
Eosinophils %: 1.3 % (ref 0.0–6.0)
Eosinophils Absolute: 0.16 E9/L (ref 0.05–0.50)
Hematocrit: 34.9 % — ABNORMAL LOW (ref 37.0–54.0)
Hemoglobin: 11.2 g/dL — ABNORMAL LOW (ref 12.5–16.5)
Immature Granulocytes #: 0.28 E9/L
Immature Granulocytes %: 2.3 % (ref 0.0–5.0)
Lymphocytes %: 29.4 % (ref 20.0–42.0)
Lymphocytes Absolute: 3.55 E9/L (ref 1.50–4.00)
MCH: 27.3 pg (ref 26.0–35.0)
MCHC: 32.1 % (ref 32.0–34.5)
MCV: 85.1 fL (ref 80.0–99.9)
MPV: 11 fL (ref 7.0–12.0)
Monocytes %: 10.9 % (ref 2.0–12.0)
Monocytes Absolute: 1.32 E9/L — ABNORMAL HIGH (ref 0.10–0.95)
Neutrophils %: 55.5 % (ref 43.0–80.0)
Neutrophils Absolute: 6.69 E9/L (ref 1.80–7.30)
Platelets: 275 E9/L (ref 130–450)
RBC: 4.1 E12/L (ref 3.80–5.80)
RDW: 15.2 fL — ABNORMAL HIGH (ref 11.5–15.0)
WBC: 12.1 E9/L — ABNORMAL HIGH (ref 4.5–11.5)

## 2017-05-10 LAB — COMPREHENSIVE METABOLIC PANEL
ALT: 22 U/L (ref 0–40)
AST: 19 U/L (ref 0–39)
Albumin: 3 g/dL — ABNORMAL LOW (ref 3.5–5.2)
Alkaline Phosphatase: 67 U/L (ref 40–129)
Anion Gap: 10 mmol/L (ref 7–16)
BUN: 17 mg/dL (ref 8–23)
CO2: 26 mmol/L (ref 22–29)
Calcium: 8.4 mg/dL — ABNORMAL LOW (ref 8.6–10.2)
Chloride: 102 mmol/L (ref 98–107)
Creatinine: 1 mg/dL (ref 0.7–1.2)
GFR African American: >60
GFR Non-African American: >60 mL/min/{1.73_m2} (ref >=60)
Glucose: 112 mg/dL — ABNORMAL HIGH (ref 74–109)
Potassium: 4 mmol/L (ref 3.5–5.0)
Sodium: 138 mmol/L (ref 132–146)
Total Bilirubin: 0.4 mg/dL (ref 0.0–1.2)
Total Protein: 6 g/dL — ABNORMAL LOW (ref 6.4–8.3)

## 2017-05-10 MED ORDER — ZOLPIDEM TARTRATE 5 MG PO TABS
5 MG | Freq: Every evening | ORAL | Status: DC | PRN
Start: 2017-05-10 — End: 2017-05-11
  Administered 2017-05-09: 04:00:00 5 mg via ORAL

## 2017-05-10 MED ORDER — TRIAMCINOLONE ACETONIDE 40 MG/ML IJ SUSP
40 MG/ML | Freq: Once | INTRAMUSCULAR | Status: DC
Start: 2017-05-10 — End: 2017-05-11

## 2017-05-10 MED ORDER — BUPIVACAINE HCL (PF) 0.25 % IJ SOLN
0.25 % | Freq: Once | INTRAMUSCULAR | Status: DC
Start: 2017-05-10 — End: 2017-05-11

## 2017-05-10 MED FILL — STERILE WATER FOR INJECTION IJ SOLN: INTRAMUSCULAR | Qty: 10

## 2017-05-10 MED FILL — SIMVASTATIN 40 MG PO TABS: 40 MG | ORAL | Qty: 2

## 2017-05-10 MED FILL — LOSARTAN POTASSIUM 50 MG PO TABS: 50 MG | ORAL | Qty: 2

## 2017-05-10 MED FILL — CEFTRIAXONE SODIUM 1 G IJ SOLR: 1 g | INTRAMUSCULAR | Qty: 1

## 2017-05-10 MED FILL — ENOXAPARIN SODIUM 40 MG/0.4ML SC SOLN: 40 MG/0.4ML | SUBCUTANEOUS | Qty: 0.4

## 2017-05-10 MED FILL — TAMSULOSIN HCL 0.4 MG PO CAPS: 0.4 MG | ORAL | Qty: 1

## 2017-05-10 MED FILL — CLOPIDOGREL BISULFATE 75 MG PO TABS: 75 MG | ORAL | Qty: 1

## 2017-05-10 MED FILL — ASPIRIN EC 81 MG PO TBEC: 81 MG | ORAL | Qty: 1

## 2017-05-10 MED FILL — BUPIVACAINE HCL (PF) 0.25 % IJ SOLN: 0.25 % | INTRAMUSCULAR | Qty: 10

## 2017-05-10 MED FILL — SODIUM CHLORIDE FLUSH 0.9 % IV SOLN: 0.9 % | INTRAVENOUS | Qty: 10

## 2017-05-10 MED FILL — ZOLPIDEM TARTRATE 5 MG PO TABS: 5 MG | ORAL | Qty: 1

## 2017-05-10 NOTE — Progress Notes (Signed)
Subjective:    The patient is awake and alert.  No problems overnight.  Denies chest pain, angina, and dyspnea.  Denies abdominal pain.  Tolerating diet.  No nausea or vomiting. Less pain in the right knee difficulty walking seen by orthopedic. Urinary tract infection improving    Objective:    BP (!) 146/78    Pulse 77    Temp 97.6 ??F (36.4 ??C) (Oral)    Resp 18    Ht 6\' 2"  (1.88 m)    Wt 224 lb 6.4 oz (101.8 kg)    SpO2 97%    BMI 28.81 kg/m??   Head and Neck: normal atraumatic, no neck masses, normal thyroid, no jvd  Heart:  regular rate and rhythm, S1, S2 normal, no murmur, click, rub or gallop  Lungs:  chest clear, no wheezing, rales, normal symmetric air entry, pulse oximetry on room air is 97%, no chest wall deformities or tenderness  Abd: soft, non-tender, without masses or organomegaly  Extrem:  Tender swollen right knee joint with effusion that is moderate no evidence of infection knee is not hot  Neuro:Normal, no focal neurological deficit     CBC with Differential:    Lab Results   Component Value Date    WBC 12.1 05/10/2017    RBC 4.10 05/10/2017    HGB 11.2 05/10/2017    HCT 34.9 05/10/2017    PLT 275 05/10/2017    MCV 85.1 05/10/2017    MCH 27.3 05/10/2017    MCHC 32.1 05/10/2017    RDW 15.2 05/10/2017    SEGSPCT 59 06/12/2012    LYMPHOPCT 29.4 05/10/2017    MONOPCT 10.9 05/10/2017    BASOPCT 0.6 05/10/2017    MONOSABS 1.32 05/10/2017    LYMPHSABS 3.55 05/10/2017    EOSABS 0.16 05/10/2017    BASOSABS 0.07 05/10/2017     CMP:    Lab Results   Component Value Date    NA 138 05/10/2017    K 4.0 05/10/2017    CL 102 05/10/2017    CO2 26 05/10/2017    BUN 17 05/10/2017    CREATININE 1.0 05/10/2017    GFRAA >60 05/10/2017    LABGLOM >60 05/10/2017    GLUCOSE 112 05/10/2017    GLUCOSE 107 01/08/2011    PROT 6.0 05/10/2017    LABALBU 3.0 05/10/2017    LABALBU 4.5 01/06/2011    CALCIUM 8.4 05/10/2017    BILITOT 0.4 05/10/2017    ALKPHOS 67 05/10/2017    AST 19 05/10/2017    ALT 22 05/10/2017      Urine Culture,  Routine 05/09/2017 ??6:20 AM [facility]   Organism ??(Abnormal) 05/09/2017 ??6:20 AM [facility]   Escherichia coli     Urine Culture, Routine 05/09/2017 ??6:20 AM [facility]   >100,000 CFU/ml   Sensitivity to follow     Testing Performed By     Lab - Abbreviation Name Director Address Valid Date Range   49-[facility] - [facility] Rosalie DoctorGregory Roush, MD [address]  [place] [facility] [id number] 05/10/16 1221-Present       Assessment:    Right knee osteoarthritis  Right knee effusion  Gait disorder  UTI due to E. coli on Rocephin      Plan:  Continue on current antibiotic. Advance activity. Awaiting input from orthopedic about possible aspiration joint injection and physical therapy discharge when able to ambulate at that  time we'll switch to oral antibiotics awaiting sensitivity on the E. coli    Tetsuo Coppola L Shamiracle Gorden  4:29 PM  05/10/2017

## 2017-05-10 NOTE — Plan of Care (Signed)
Problem: Pain:  Goal: Pain level will decrease  Pain level will decrease   Outcome: Ongoing    Goal: Control of acute pain  Control of acute pain   Outcome: Ongoing    Goal: Control of chronic pain  Control of chronic pain   Outcome: Ongoing      Problem: Falls - Risk of:  Goal: Will remain free from falls  Will remain free from falls   Outcome: Ongoing    Goal: Absence of physical injury  Absence of physical injury   Outcome: Ongoing

## 2017-05-10 NOTE — Progress Notes (Addendum)
Department of Orthopedic Surgery  Resident Progress Note    Patient seen and examined. Pain is well controlled. He says that the effusion has remained the same. Has had no change in pain or swelling. No new complaints.  Denies chest pain, shortness of breath, calf pain, dizziness/lightheadedness.    VITALS:  BP (!) 184/80   Pulse 68   Temp 97.8 F (36.6 C) (Oral)   Resp 14   Ht 6\' 2"  (1.88 m)   Wt 224 lb 6.4 oz (101.8 kg)   SpO2 91%   BMI 28.81 kg/m     GENERAL: alert and oriented  MUSCULOSKELETAL:   right lower extremity:   Ace wrap intact and clean   Knee effusion still present   -TTP   No erythema   Some pain with straight leg raise and ROM   Demonstrates ability to straight leg raise   Compartments soft and compressible, calf non-tender   Palpable dorsalis pedis and posterior tibialis pulse, brisk cap refill to toes, foot warm and perfused   Sensation intact to light touch in sural/deep peroneal/superficial peroneal/saphenous/posterior tibial nerve distributions to foot/ankle.   Demonstrates active ankle plantar/dorsiflexion/great toe extension    CBC:   Lab Results   Component Value Date    WBC 12.1 05/10/2017    HGB 11.2 05/10/2017    HCT 34.9 05/10/2017    PLT 275 05/10/2017       ASSESSMENT   RIght knee effusion    PLAN     WBAT RLE   PT/OT   Ice   Maintain ace wrap   Elevate RLE   Pain control per attending   Will possibly aspirate later   Agree with above   Patient was seen and examined

## 2017-05-10 NOTE — Progress Notes (Signed)
0416/0416-02    PHYSICAL THERAPY INITIAL EVALUATION  Tentative placement recommendation: SAR  Equipment prescriptions needed:  none  Plan of care: Patient will be seen  BID for therapeutic exercise, functional retraining, endurance activities, balance exercises, family and patient education.        AM-PAC Basic Mobility       AM-PAC Mobility Inpatient   How much difficulty turning over in bed?: A Little  How much difficulty sitting down on / standing up from a chair with arms?: A Lot  How much difficulty moving from lying on back to sitting on side of bed?: A Little  How much help from another person moving to and from a bed to a chair?: A Lot  How much help from another person needed to walk in hospital room?: A Lot  How much help from another person for climbing 3-5 steps with a railing?: Total  AM-PAC Inpatient Mobility Raw Score : 13  AM-PAC Inpatient T-Scale Score : 36.74  Mobility Inpatient CMS 0-100% Score: 64.91  Mobility Inpatient CMS G-Code Modifier : CL    Order: evaluate and treat  Diagnosis:    1. Altered mental status, unspecified altered mental status type    2. Urinary tract infection without hematuria, site unspecified    3. Acute pain of right knee    4. Frequent falls    5. Sepsis, due to unspecified organism [facility])      Past medical history:       Diagnosis Date   . Acute MI ([place])     pt had mi 1997   . CAD (coronary artery disease)    . Cholesterol serum increased    . Hyperlipidemia    . Prostate cancer ([place])    . Prostate cancer ([place])    ;      Procedure Laterality Date   . CARDIAC SURGERY     . CORONARY ANGIOPLASTY      pt had a triple by pass 1997   . CORONARY ANGIOPLASTY      pt had cat 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       The admitting diagnosis and active problem list as listed above have been reviewed prior to the initiation of this evaluation.  Nursing cleared the patient for evaluation.   Patient is agreeable to evaluation.    Precautions: falls   Social history: Patient lives with family  wife  in a single family home with 2 steps to enter        Equipment owned: straight cane, walker and shower chair,   Mentation: alert, cooperative, oriented x 3 and follows directions,   Prior level of function: independent and drove  ROM: within functional limits   STRENGTH: 4+/5 LE  PAIN: (measured on a visual analog scale with 0=no pain and 10=excruciating pain) 8/10. R knee on weightbearing  FUNCTIONAL ASSESSMENT   Bed Mobility- Supine to sit-Minimal assists      Scooting- Minimal assists      Sit to supine- Minimal assists    Patient able to dangle on edge of bed for 5 minutes with SBA assist   Transfers-sit to stand- Moderatemarked assistance    Gait:  Patient was unable to ambulate secondary to pain in R knee    Balance: sit-good       stand fair     Edema: yes - R knee moderate  Endurance: fair      Treatment:evaluation;    Patient/family education:effects of immobilty  Rehab Potential: good  for baseline  Patient's Goal: get stronger and go home  ASSESSMENT  Patient exhibits decreased rom, strength, endurance,  balance, coordination impairing functional mobility.  Goals to be met in 3 days.  Bed mobility-Supervision  Transfers-Minimal assists   Ambulation-Moderate assistance  for 30 feet using  wheeled walker    Increase rom in affected joints by 10%  Increase strength in affected muscle groups by 1/3 grade  Increase balance to allow for improvement towards functional goals.  Increase endurance to allow for improvement towards functional goals.

## 2017-05-10 NOTE — Progress Notes (Signed)
Reapplied Ace Wrap per Dr Joseph ArtWoods Plan of care. Leg elevated also and pt told that ice is still recommended.

## 2017-05-10 NOTE — Progress Notes (Signed)
Physical Therapy  Daily Treatment Note  05/10/2017  0416/0416-02                      Tyler Rhodes   [id number]                              1929/07/08    Patient Active Problem List   Diagnosis   ??? Elevated serum cholesterol   ??? Hyperlipidemia   ??? Acute MI ([place])   ??? Sepsis ([place])   ??? Arthritis of knee, right   ??? UTI (urinary tract infection)       Recommendation for discharge: Subacute vs HHPT with family assist   Equipment prescriptions needed: none     AM-PAC Mobility Inpatient   How much difficulty turning over in bed?: A Little  How much difficulty sitting down on / standing up from a chair with arms?: A Lot  How much difficulty moving from lying on back to sitting on side of bed?: A Little  How much help from another person moving to and from a bed to a chair?: A Little  How much help from another person needed to walk in hospital room?: A Little  How much help from another person for climbing 3-5 steps with a railing?: A Lot  AM-PAC Inpatient Mobility Raw Score : 16  AM-PAC Inpatient T-Scale Score : 40.78  Mobility Inpatient CMS 0-100% Score: 54.16  Mobility Inpatient CMS G-Code Modifier : CK    Precautions: falls, alarm, WBAT: R LE d/t effusion     S: Patient cleared by nursing for treatment. Patient is agreeable to treatment. Pt c/o R knee pain.  Pain status: (measured on a visual analog scale with 0=no pain and 10=excruciating pain) No number assigned to pain/10.   O: Pt was instructed in and performed the following:   Bed Mobility- Supine to sit- Minimal assist     Scooting- Minimal Assist     Sit to supine- Minimal assist   Transfers-sit to stand- Moderate assistance      Gait: Patient ambulated 3 feet using Wheeled Walker with Minimal assist. Comments: V/C for safety, upright posture, sequencing, and walker management.    Steps: NA  Treatment: Pt transferred to side of bed and sat up x 30 minutes with supervision. Pt performed seated ankle pumps, LAQ, hip abd/add, and marching 2 x 10 reps. V/C for technique.  Pt stood and took steps towards HOB. Pt RTB and declined pain meds from nursing at this time.   Comment: Pt RTB at end of treatment, alarm activated and family present. Call light left by patient.    A: Pt con't to c/o significant R knee pain but was able to progress and take steps. Pt was motivated to participate but had poor tolerance to weight bearing activities. Pt is at risk of falls.   P: Continue with physical therapy   Milda Lindvall, PTA    Goals to be met in 3 days.  Bed mobility- Supervision  Transfers- Minimal assists   Ambulation- Moderate assistance for 30 feet using wheeled walker  ??  Increase rom in affected joints by 10%  Increase strength in affected muscle groups by 1/3 grade  Increase balance to allow for improvement towards functional goals.  Increase endurance to allow for improvement towards functional goals.

## 2017-05-10 NOTE — Plan of Care (Signed)
Problem: Falls - Risk of:  Goal: Will remain free from falls  Will remain free from falls   Outcome: Met This Shift

## 2017-05-10 NOTE — Progress Notes (Addendum)
Occupational Therapy  Occupational Therapy Initial Assessment    Date:05/10/2017  Patient Name: Tyler Rhodes  MRN: [id number]  DOB: [birth year]  ROOM #: 0416/0416-02    Placement Recommendation: Subacute vs. HHOT with family assistance   Equipment Prescriptions Needed: none     AMPAC   AM-PAC Daily Activity Inpatient   How much help for putting on and taking off regular lower body clothing?: A Lot  How much help for Bathing?: A Lot  How much help for Toileting?: A Little  How much help for putting on and taking off regular upper body clothing?: A Little  How much help for taking care of personal grooming?: A Little  How much help for eating meals?: None  AM-PAC Inpatient Daily Activity Raw Score: 17  AM-PAC Inpatient ADL T-Scale Score : 37.26  ADL Inpatient CMS 0-100% Score: 50.11  ADL Inpatient CMS G-Code Modifier : CK    Diagnosis:   1. Altered mental status, unspecified altered mental status type    2. Urinary tract infection without hematuria, site unspecified    3. Acute pain of right knee    4. Frequent falls    5. Sepsis, due to unspecified organism [facility])      Past Medical History:   Past Medical History:   Diagnosis Date   ??? Acute MI (HCC)     pt had mi 1997   ??? CAD (coronary artery disease)    ??? Cholesterol serum increased    ??? Hyperlipidemia    ??? Prostate cancer (HCC)    ??? Prostate cancer [facility])          The admitting diagnosis and active problem list as listed above have been reviewed prior to the initiation of this evaluation. Nursing cleared the patient for evaluation.   Patient is agreeable to evaluation.    Precautions: falls, WBAT: R LE d/t effusion   Pain Scale: Numeric Rate: no pain lying supine, 7/10 standing and ambulating; Nursing notified.    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   PLOF: independent with BADL  and IADL, ambulated with no device   Equipment owned: cane, shower chair, riser on the toilet, grab bars, standard walker (just got 2 days ago)  Cognition: oriented x 4; follows 3 step directions.    good  Problem solving skills   good  Memory    good  Sequencing  Communication: intact   Visual perceptual skills: intact     Glasses: yes   Edema: yes , R knee    Sensation: intact   Hand Dominance:  Left     X  Right     Left Right Comment   Passive range of motion [facility]     Active range of motion [facility]  WFL with exception of limited digit extension      Muscle Grade 4/5 4/5    Grip/pinch Strength Intact  Intact     []  Malnutrition indicators have been identified and nursing has been notified to ensure a dietitian consult is ordered.     Function Assessment    Status  Goal Comment   Feeding:  Independent   Independent      Grooming:  Minimal Assist at sink level  Independent  UE dressing:  Minimal Assist  Independent  To tie gown while seated EOB.   LE dressing:  Moderate Assist with adaptive equipment d/t decreased R knee flexion. Independent  To doff and don xl socks using a long handle shoe horn/reacher/sock aide.  To do don xxl socks with sock aide.    Bathing:  Moderate Assist  Independent     Toileting:  Minimal Assist  Independent     Bed Mobility:     Minimal Assist  for supine to sit,   Minimal Assist  for scooting,  Sit to supine: n/t as pt was up in chair Independent     Functional Transfers:    Moderate Assist  for sit to stand transfer from EOB to wheeled walker with bed height elevated.  Moderate Assist for stand to sit transfer to bedside chair.   Moderate Assist for sit to stand transfer from bedside chair to bedside commode.  Independent  Safety: good    Functional Mobility: 5 feet x 2 + 3 feet with wheeled walker and  Moderate Assist  Modified Independence       Pt/family participated in establishment of goals.     Balance:   Sitting: good   Standing: fair with wheeled walker    Endurance: fair        Assessment of Current Deficits:   [x] Functional mobility  [x] ROM  [x] Strength   [] Cognition   [] Safety Awareness    [x] ADLs [x] IADLs   [x] Endurance  [x] Balance    [] Vision  [] Sensation     [] Gross Motor Coordination       [] Fine Motor Coordination      ?? Low Complexity  ?? History: Brief history including review of medical records relating to the problem  ?? Exam: 1-3 performance Deficits  ?? Assistance/Modification: No assistance or modifications required to perform tasks. No comorbities affecting occupational performance.    Patient???s Goal: return home   Treatment: OT eval, bed mobility, sitting balance at EOB for 10 minutes with supervision, transfer training with verbal cues for hand placement, static standing balance with wheeled walker, functional mobility with wheeled walker to bedside chair, verbal cues for walker sequence and safety, pt introduced to and demonstrated use of AE, lower body dressing using AE, pt then assisted to bedside commode at end of eval. Pt will put call light on when finished but [facility]. All needs within reach.      Rehab Potential: good   Plan of care: Patient will be seen by OT 1-3 times a week for therapeutic activity, ADL re-training, bed mobility, functional transfers, functional mobility, safety and fall prevention, balance and endurance activities, instruction in energy conservation principles, and patient/family education.   Patient and/or family understands diagnosis, prognosis, education and plan of care. Pt/family verbalized understanding.     Time Code treatment minutes: [address]    Demetrius Mahler OTR/L #[id number]

## 2017-05-10 NOTE — Plan of Care (Signed)
Problem: Pain:  Goal: Pain level will decrease  Pain level will decrease   Outcome: Met This Shift      Problem: Falls - Risk of:  Goal: Will remain free from falls  Will remain free from falls   Outcome: Met This Shift    Goal: Absence of physical injury  Absence of physical injury   Outcome: Met This Shift

## 2017-05-11 LAB — CULTURE, URINE: Urine Culture, Routine: >100000

## 2017-05-11 MED ORDER — LEVOFLOXACIN 500 MG PO TABS
500 MG | ORAL_TABLET | Freq: Every day | ORAL | 0 refills | Status: AC
Start: 2017-05-11 — End: 2017-05-21

## 2017-05-11 MED ORDER — LOSARTAN POTASSIUM 100 MG PO TABS
100 MG | ORAL_TABLET | Freq: Every day | ORAL | 3 refills | Status: DC
Start: 2017-05-11 — End: 2020-11-09

## 2017-05-11 MED ORDER — MELOXICAM 15 MG PO TABS
15 MG | ORAL_TABLET | Freq: Every day | ORAL | 3 refills | Status: DC
Start: 2017-05-11 — End: 2020-11-09

## 2017-05-11 MED ORDER — ACETAMINOPHEN 325 MG PO TABS
325 MG | ORAL_TABLET | ORAL | 3 refills | Status: DC | PRN
Start: 2017-05-11 — End: 2020-11-23

## 2017-05-11 MED FILL — STERILE WATER FOR INJECTION IJ SOLN: INTRAMUSCULAR | Qty: 10

## 2017-05-11 MED FILL — ENOXAPARIN SODIUM 40 MG/0.4ML SC SOLN: 40 MG/0.4ML | SUBCUTANEOUS | Qty: 0.4

## 2017-05-11 MED FILL — CHLORHEXIDINE GLUCONATE 0.12 % MT SOLN: 0.12 % | OROMUCOSAL | Qty: 15

## 2017-05-11 MED FILL — SIMVASTATIN 40 MG PO TABS: 40 MG | ORAL | Qty: 2

## 2017-05-11 MED FILL — CEFTRIAXONE SODIUM 1 G IJ SOLR: 1 g | INTRAMUSCULAR | Qty: 1

## 2017-05-11 MED FILL — MAPAP 325 MG PO TABS: 325 MG | ORAL | Qty: 2

## 2017-05-11 MED FILL — CLOPIDOGREL BISULFATE 75 MG PO TABS: 75 MG | ORAL | Qty: 1

## 2017-05-11 MED FILL — ASPIRIN EC 81 MG PO TBEC: 81 MG | ORAL | Qty: 1

## 2017-05-11 MED FILL — LOSARTAN POTASSIUM 50 MG PO TABS: 50 MG | ORAL | Qty: 2

## 2017-05-11 NOTE — Progress Notes (Signed)
0416/0416-02    Patient unavailable for physical therapy treatment due to patient requested to remain seated in chair d/t fatigue from being up multiple times this AM. Pt reported he may be discharge later this PM.           Yuji Walth, PTA

## 2017-05-11 NOTE — Progress Notes (Signed)
Department of Orthopedic Surgery  Resident Progress Note    Patient seen and examined. Pain well controlled, states that it is improving. No new complaints.  Denies chest pain, shortness of breath, calf pain, dizziness/lightheadedness. He reports that he has worked well with therapy and would not like knee aspiration or injection at this time.    VITALS:  BP 135/67    Pulse 79    Temp 96.4 ??F (35.8 ??C) (Oral)    Resp 15    Ht 6\' 2"  (1.88 m)    Wt 224 lb 6.4 oz (101.8 kg)    SpO2 97%    BMI 28.81 kg/m??     GENERAL: alert and oriented  MUSCULOSKELETAL:   right lower extremity:  ?? Ace C/D/I  ?? Effusion resolving  ?? Non tender to palpation about knee  ?? Compartments soft and compressible, calf non-tender  ?? Palpable dorsalis pedis and posterior tibialis pulse, brisk cap refill to toes, foot warm and perfused  ?? Sensation intact to light touch in sural/deep peroneal/superficial peroneal/saphenous/posterior tibial nerve distributions to foot/ankle.  ?? Demonstrates active ankle plantar/dorsiflexion/great toe extension    CBC:   Lab Results   Component Value Date    WBC 12.1 05/10/2017    HGB 11.2 05/10/2017    HCT 34.9 05/10/2017    PLT 275 05/10/2017       ASSESSMENT  ?? R knee effusion - resolving    PLAN    ?? WBAT  ?? Has worked well with therapy  ?? Pain control per primary  ?? No orthopedic intervention at this time  ?? Okay for discharge from orthopedic standpoint  ?? May follow up with Dr. Joseph ArtWoods as needed outpatient  ?? Discuss with attending

## 2017-05-11 NOTE — Progress Notes (Signed)
Occupational Therapy  O.T. Bedside Treatment Note    Date:05/11/2017  Patient Name: Tyler Rhodes  MRN: [id number]  DOB: 01-Mar-1929  ROOM #: 0416/0416-02    Problem list / Diagnosis:   Patient Active Problem List   Diagnosis   ??? Elevated serum cholesterol   ??? Hyperlipidemia   ??? Acute MI (HCC)   ??? Sepsis (HCC)   ??? Arthritis of knee, right   ??? UTI (urinary tract infection)   ??? Anemia     Past Medical History:   Past Medical History:   Diagnosis Date   ??? Acute MI (HCC)     pt had mi 1997   ??? CAD (coronary artery disease)    ??? Cholesterol serum increased    ??? Hyperlipidemia    ??? Prostate cancer (HCC)    ??? Prostate cancer (HCC)      Onset/Medical history: medical history reviewed  Past medical history: reviewed    The admitting diagnosis and active problem list as listed above have been reviewed prior to treatment. Nursing cleared the patient for treatment. Patient is agreeable to treatment.    Discharge recommendations: HHOT with assistance from family   Equipment prescriptions needed: none     AM - PAC Daily Activity Inpatient   AM-PAC Daily Activity Inpatient   How much help for putting on and taking off regular lower body clothing?: A Lot  How much help for Bathing?: A Little  How much help for Toileting?: A Little  How much help for putting on and taking off regular upper body clothing?: A Little  How much help for taking care of personal grooming?: A Little  How much help for eating meals?: None  AM-PAC Inpatient Daily Activity Raw Score: 18  AM-PAC Inpatient ADL T-Scale Score : 38.66  ADL Inpatient CMS 0-100% Score: 46.65  ADL Inpatient CMS G-Code Modifier : CK    Precautions: falls, R knee effusion    S:  - Pt cleared for treatment through nursing.  - Pain:  No pain during ambulation, 3/10 pain lying supine in R knee. Despite level of pain the pt was willing to participate in therapy. Nurse notified.   - No family present.     O:    Function Assessment    Status  Comment  Goal    Feeding:  Independent   To bring cup  to mouth.  Independent    UE dressing:  Minimal Assist  To tie/adjust back of gown Independent    Bed Mobility:    Supervision for supine to sit;   Supervision for scooting;   Sit to supine: n/t as pt was up in chair Educated pt on safety, fall prevention and hand placement Independent    Functional Transfers:  Minimal Assist sit to stand from EOB to wheeled walker with bed height elevated  Verbal cues for hand placement Independent    Functional mobility: 40 feet x 2 with wheeled walker and  Minimal Assist    Verbal cues for walker sequence and safety Modified Independence      - BUE AROM exercises: 20 X in all planes of movement to increase ROM required for functional transfers/ADL participation. Exercises completed in shoulder and elbow flexion/extension, internal/external rotation, abduction/adduction, supination/pronation, digit and wrist flexion/extension, abduction/adduction and digit opposition. B LE AROM x 15 marching, ankle pumps, LAQ.     A:  -Balance: Sitting: good  at EOB and bedside chair       Standing: fair  with wheeled walker  -  Endurance: fair    -Edema: yes - R knee  -Safety:fair , chair alarm on.   - Pt demo'd fair activity tolerance.   - Pt/family education: bed mobility, transfer training, functional mobility, AE for LE dressing, toileting/hygiene, sequencing, hand placement, walker safety, bathroom safety, DME, BUE strengthening ex's, BUE ROM ex's, ECT's, hip precautions, joint protection techniques,  grooming, ADL retraining, self-feeding  -Pt would benefit from additional ADLs, safety education, balance activities, transfer training, strength exercises, and over all endurance building.    P:  - Plan of care: Patient will be seen by OT 1-3 times a week for therapeutic activity, ADL re-training, bed mobility, functional transfers, functional mobility, safety and fall prevention, balance and endurance activities, instruction in energy conservation principles, and patient/family education.   -  Patient and/or family understands diagnosis, prognosis and plan of care: yes     Time Code treatment minutes: 30  Electronically signed by Sherrilee GillesBrenda Nicholous Girgenti, OT on 05/11/2017 at 10:02 AM    Sherrilee GillesBrenda Lenzie Sandler OTR/L [phone]

## 2017-05-11 NOTE — Care Coordination-Inpatient (Signed)
Ss note: 05/11/2017 3:27 PM discharge order noted. Arranged EMT ambulette transfer to [facility] today at 4:30 pm. Facility liaison, nursing and pt wife aware. HENS completed. Evlyn CourierKendal Deshauna Cayson, LSW

## 2017-05-11 NOTE — Discharge Instructions (Signed)
Your information:  Name: Phineas RealJames D Keep  DOB: 1928-11-08    Your instructions:    [facility]    CALL DR. FONTANAROSA OR GO TO THE NEAREST EMERGENCY ROOM IF YOU EXPERIENCE ANY OF THE FOLLOWING SYMPTOMS:  CHEST PAIN OR SHORTNESS OF BREATH; PAIN OR BURNING WITH URINATION; BLOOD OR PUS IN THE URINE; LOW URINE OUTPUT; NEW OR WORSENING CONFUSION; OTHER SIGNS OF INFECTION SUCH AS FEVER OR CHILLS.    What to do after you leave the hospital:    Recommended diet: regular diet    Recommended activity: activity as tolerated        The following personal items were collected during your admission and were returned to you:    Valuables  Dentures: Lowers, Partials, Uppers  Vision - Corrective Lenses: Glasses  Hearing Aid: Bilateral hearing aids  Jewelry: None  Body Piercings Removed: N/A  Clothing: Pants, Shirt, Socks, Undergarments (Comment)  Were All Patient Medications Collected?: Not Applicable  Other Valuables: None  Valuables Given To: Patient  Patient approves for provider to speak to responsible person post operatively: Yes    Information obtained by:  By signing below, I understand that if any problems occur once I leave the hospital I am to contact Dr. Raymond GurneyFontanarosa.  I understand and acknowledge receipt of the instructions indicated above.

## 2017-05-11 NOTE — Care Coordination-Inpatient (Signed)
Ss note:05/11/2017.9:50 AM follow up visit to pt room, no family present. Pt relays he is not receptive to SNF placement, originally pt requesting outpt therapy however [facility] and [facility] ([facility].) [facility] will need [facility]. VM message left for wife stating above. Evlyn CourierKendal Maclain Cohron, LSW

## 2017-05-11 NOTE — Progress Notes (Signed)
[facility] Quality Flow/Interdisciplinary Rounds Progress Note        Quality Flow Rounds held on May 11, 2017    Disciplines Attending:  Bedside Nurse, Social Worker, Case Manager and Nursing Unit Leadership    Tyler Rhodes was admitted on 05/09/2017  5:06 AM    Anticipated Discharge Date:  Expected Discharge Date: 05/13/17    Disposition:    Braden Score:  Braden Scale Score: 20    Readmission Risk              Risk of Unplanned Readmission:        15             Discussed patient goal for the day, patient clinical progression, and barriers to discharge.  The following Goal(s) of the Day/Commitment(s) have been identified:  Possible SNF placement, activity progression      Gracy BruinsKimberlee J Antoria Lanza  May 11, 2017

## 2017-05-11 NOTE — Care Coordination-Inpatient (Signed)
Ss note:05/11/2017.12:48 PM Met with wife who relays she wants pt to go to short term rehab, pt is in agreement. [facility]. [facility] to [facility], awaiting  Acceptance, pt will require a PRECERT . SW will follow. Kristopher Oppenheim, LSW

## 2017-05-11 NOTE — Care Coordination-Inpatient (Signed)
Ss note:05/11/2017.2:33 PM Per Karen KitchensBobbie [phone]) [facility] and can admit today as PRECERT is just a phone call. Charge nurse notified, awaiting d/c order to arrange transfer. Nurse to nurse [phone]. HENS completed, n 17 has been signed. Wife will need notified when dc is arranged. Evlyn CourierKendal Abriana Saltos, LSW

## 2017-05-11 NOTE — Plan of Care (Signed)
Problem: Falls - Risk of:  Goal: Will remain free from falls  Will remain free from falls   Outcome: Met This Shift

## 2017-05-11 NOTE — Progress Notes (Signed)
Physical Therapy  Daily Treatment Note  05/11/2017  0416/0416-02                      HENOK HEACOCK   [id number]                              03-07-1929    Patient Active Problem List   Diagnosis   ??? Elevated serum cholesterol   ??? Hyperlipidemia   ??? Acute MI ([place])   ??? Sepsis ([place])   ??? Arthritis of knee, right   ??? UTI (urinary tract infection)   ??? Anemia       Recommendation for discharge: Subacute vs HHPT with family assist   Equipment prescriptions needed: none     AM-PAC Mobility Inpatient   How much difficulty turning over in bed?: A Little  How much difficulty sitting down on / standing up from a chair with arms?: A Little  How much difficulty moving from lying on back to sitting on side of bed?: A Little  How much help from another person moving to and from a bed to a chair?: A Little  How much help from another person needed to walk in hospital room?: A Little  How much help from another person for climbing 3-5 steps with a railing?: A Lot  AM-PAC Inpatient Mobility Raw Score : 17  AM-PAC Inpatient T-Scale Score : 42.13  Mobility Inpatient CMS 0-100% Score: 50.57  Mobility Inpatient CMS G-Code Modifier : CK    Precautions: falls, alarm, WBAT: R LE d/t effusion     S: Patient cleared by nursing for treatment. Patient is agreeable to treatment. Pt c/o R knee pain but stated it is much better and was able to ambulate this AM with OT. Pt sitting in chair at start of treatment.  Pain status: (measured on a visual analog scale with 0=no pain and 10=excruciating pain) No number assigned to pain/10.   O: Pt was instructed in and performed the following:   Bed Mobility- Supine to sit- NA     Scooting- NA   Sit to supine- NA   Transfers-sit to stand- Minimal assistance      Gait: Patient ambulated 40 feet x 2 using Wheeled Walker with Minimal assist. Comments: V/C for safety, upright posture, sequencing, and walker management.    Steps: NA  Treatment: Pt stood from chair and ambulated in hallway. Pt returned to bedside chair  and performed seated ankle pumps, LAQ, and marching x 15 reps. V/C for technique.   Comment: Pt sitting in chair at end of treatment, alarm activated. Call light left by patient.    A: Pt was able to progress ambulation distance with no LOB noted. Pt had decreased c/o pain and required decreased assist for transfers. Pt was motivated to participate and pleasant.  P: Continue with physical therapy   Chyan Carnero, PTA    Goals to be met in 3 days.  Bed mobility- Supervision  Transfers- Minimal assists   Ambulation- Moderate assistance for 30 feet using wheeled walker  ??  Increase rom in affected joints by 10%  Increase strength in affected muscle groups by 1/3 grade  Increase balance to allow for improvement towards functional goals.  Increase endurance to allow for improvement towards functional goals.

## 2017-05-11 NOTE — Discharge Summary (Signed)
Discharge Summary Note  Patient ID:  Tyler Rhodes  [id number]  83 y.o.  1929-09-11    Admit date: 05/09/2017    Discharge date and time: No discharge date for patient encounter.     Admitting Physician: Caleen JobsSohair K Zethan Alfieri, MD     Discharge Physician: Bonney LeitzMourad L Michell Kader, MD    Admission Diagnoses:   Sepsis [facility](HCC) [A41.9]    Discharge Diagnoses:   UTI due to E. Coli  Right knee pain and arthritis  Gait disorder  Hyperlipidemia  Anemia unspecified  History of coronary artery disease native coronaries      Admission Condition: poor    Discharged Condition: good    Hospital Course: Patient was admitted with UTI.     Consults: orthopedic surgery    Significant Diagnostic Studies: labs: As in lab section, microbiology: blood culture: negative and urine culture: positive for E. coli and radiology: X-Ray: Knee and lumbar spine and CT scan: Head without contrast    Treatments: IV hydration and antibiotics: ceftriaxone    Discharge Exam:  Alert oriented no acute distress  Lungs clear to auscultation  Heart regular rate and rhythm  Abdomen soft nontender  Extremities knee wrapped in Ace wrap otherwise no abnormality  Neuro intact  Skin intact  Psych normal    Disposition: SNF    Patient Instructions:     Discharge Medications:   Tyler RealVogt, Tyler Rhodes   Home Medication Instructions VWU:[id number]    Printed on:05/11/17 1235   Medication Information                      aspirin 81 MG tablet  Take 81 mg by mouth daily             chlorhexidine (PERIDEX) 0.12 % solution  Take 15 mLs by mouth nightly             clopidogrel (PLAVIX) 75 MG tablet  Take 75 mg by mouth daily             nitroGLYCERIN (NITROSTAT) 0.4 MG SL tablet  Place 0.4 mg under the tongue every 5 minutes as needed for Chest pain up to max of 3 total doses. If no relief after 1 dose, call 911.             Omega-3 Fatty Acids (FISH OIL) 1200 MG CAPS  Take 1,200 mg by mouth daily             simvastatin (ZOCOR) 80 MG tablet  Take 80 mg by mouth daily              tamsulosin (FLOMAX)  0.4 MG capsule  Take 0.4 mg by mouth every evening                  Activity: activity as tolerated    Diet: regular diet    Wound Care: none needed    Follow-up:  With PCP at nursing home    Electronically signed by Bonney LeitzMourad L Whitten Andreoni, MD  on 05/11/17 at 12:35 PM

## 2017-05-14 LAB — CULTURE BLOOD #1: Blood Culture, Routine: 5

## 2017-05-14 LAB — CULTURE, BLOOD 2: Culture, Blood 2: 5

## 2017-05-18 LAB — CBC AND DIFFERENTIAL
HEMATOCRIT: 31 — AB (ref 41–53)
HEMOGLOBIN: 10.6 — AB (ref 13.5–17.5)
PLATELETS: 309 (ref 150–399)
WBC: 8.8

## 2017-05-18 LAB — HEPATIC FUNCTION PANEL
ALK PHOS: 59 (ref 25–125)
ALT: 14 (ref 10–40)
AST: 14 (ref 14–40)

## 2017-05-18 LAB — BASIC METABOLIC PANEL
BUN: 29 — AB (ref 4–21)
Creatinine: 1.1 (ref 0.6–1.3)
Glucose: 90
Potassium: 4.7 (ref 3.4–5.3)
Sodium: 137 (ref 137–147)

## 2017-06-19 ENCOUNTER — Ambulatory Visit
Admit: 2017-06-19 | Discharge: 2017-06-19 | Payer: MEDICARE | Attending: Orthopaedic Surgery | Primary: Internal Medicine

## 2017-06-19 DIAGNOSIS — M1711 Unilateral primary osteoarthritis, right knee: Secondary | ICD-10-CM

## 2017-06-19 NOTE — Progress Notes (Signed)
Chief Complaint   Patient presents with   . Knee Pain     Right Knee, had cortisone injection 03/12/17 with good relief.  Had a fall on 05/08/17 on right knee.       Subjective:     Patient ID: Tyler Rhodes is a 83 y.o.. Caucasian male    Knee Pain  Patient complains of bilateral knee pain. This is evaluated as a personal injury. There was not a history of injury.   The pain began several years ago. The pain is located anterior. He describes  His symptoms as aching. He has not experienced popping, clicking, locking, and giving way in the affected knee.  The patient has had pain with kneeling, squating, and climbing stairs.  Symptoms improve with rest. The symptoms are worse with getting up from a chair. The knee has given out and  felt unstable. The patient can bend and straighten the knee fully.  The patient is active in none. Treatment to date has been nothing specific, without significant relief. The patient is not working.     Past Medical History:   Diagnosis Date   . Acute MI (HCC)     pt had mi 1997   . CAD (coronary artery disease)    . Cholesterol serum increased    . Hyperlipidemia    . Prostate cancer (HCC)    . Prostate cancer [facility])      Past Surgical History:   Procedure Laterality Date   . CARDIAC SURGERY     . CORONARY ANGIOPLASTY      pt had a triple by pass 1997   . CORONARY ANGIOPLASTY      pt had cat 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       Current Outpatient Prescriptions:   .  acetaminophen (TYLENOL) 325 MG tablet, Take 2 tablets by mouth every 4 hours as needed for Pain or Fever, Disp: 120 tablet, Rfl: 3  .  losartan (COZAAR) 100 MG tablet, Take 1 tablet by mouth daily, Disp: 30 tablet, Rfl: 3  .  meloxicam (MOBIC) 15 MG tablet, Take 1 tablet by mouth daily, Disp: 30 tablet, Rfl: 3  .  aspirin 81 MG tablet, Take 81 mg by mouth daily, Disp: , Rfl:   .  nitroGLYCERIN (NITROSTAT) 0.4 MG SL tablet, Place 0.4 mg under the tongue every 5 minutes as needed for Chest pain up to max of 3 total doses. If no relief  after 1 dose, call 911., Disp: , Rfl:   .  Omega-3 Fatty Acids (FISH OIL) 1200 MG CAPS, Take 1,200 mg by mouth daily, Disp: , Rfl:   .  chlorhexidine (PERIDEX) 0.12 % solution, Take 15 mLs by mouth nightly, Disp: , Rfl:   .  clopidogrel (PLAVIX) 75 MG tablet, Take 75 mg by mouth daily, Disp: , Rfl:   .  simvastatin (ZOCOR) 80 MG tablet, Take 80 mg by mouth daily , Disp: , Rfl:   .  tamsulosin (FLOMAX) 0.4 MG capsule, Take 0.4 mg by mouth every evening , Disp: , Rfl:   No Known Allergies  Social History     Social History   . Marital status: Married     Spouse name: N/A   . Number of children: N/A   . Years of education: N/A     Occupational History   . Not on file.     Social History Main Topics   . Smoking status: Former Smoker     Packs/day: 1.00  Years: 10.00   . Smokeless tobacco: Former Neurosurgeon     Quit date: 05/30/1996   . Alcohol use No   . Drug use: No   . Sexual activity: Not on file     Other Topics Concern   . Not on file     Social History Narrative   . No narrative on file     Family History   Problem Relation Age of Onset   . Stroke Mother    . Heart Disease Father          REVIEW OF SYSTEMS:     General/Constitution:  (-)weight loss, (-)fever, (-)chills, (-)weakness.   Skin: (-) rash,(-) psoriasis,(-) eczema, (-)skin cancer.   Musculoskeletal: (-) fractures,  (-) dislocations,(-) collagen vascular disease, (-) fibromyalgia, (-) multiple sclerosis, (-) muscular dystrophy, (-) RSD,(-) joint pain (-)swelling, (-) joint pain,swelling.  Neurologic: (-) epilepsy, (-)seizures,(-) brain tumor,(-) TIA, (-)stroke, (-)headaches, (-)Parkinson disease,(-) memory loss, (-) LOC.  Cardiovascular: (-) Chest pain, (-) swelling in legs/feet, (-) SOB, (-) cramping in legs/feet with walking.  Respiratory: (-) SOB, (-) Coughing, (-) night sweats.  GI: (-) nausea, (-) vomiting, (-) diarrhea, (-) blood in stool, (-) gastric ulcer.  Psychiatric: (-) Depression, (-) Anxiety, (-) bipolar disease, (-) Alzheimer's  Disease  Allergic/Immunologic: (-) allergies latex, (-) allergies metal, (-) skin sensitivity.  Hematlogic: (-) anemia, (-) blood transfusion, (-) DVT/PE, (-) Clotting disorders      Subjective:    Constitution:  Ht. 6 ft 2 in. , Wt. 225 lbs.    Psycihatric:  The patient is alert and oriented x 3, appears to be stated age and in no distress.      Respiratory:  Respiratory effort is not labored.  Patient is not gasping.  Palpation of the chest reveals no tactile fremitus.    Skin:  Upon inspection: the skin appears warm, dry and intact.  There is  a previous scar over the affected area.There is any cellulitis, lymphedema or cutaneous lesions noted in the lower extremities.   Upon palpation there is no induration noted.      Neurologic:  Gait: normal;  Motor exam of the lower extremities show ; quadriceps, hamstrings, foot dorsi and plantar flexors intact R.  5/5 and L. 5/5. Deep tendon reflexes are 2/4 at the knees and 2/4 at the ankles with strong extensor hallicus longus motor strength bilaterally. Sensory to both feet is intact to all sensory roots.    Cardiovascular:  The vascular exam is normal and is well perfused to distal extremities.  Distal pulses DP/PT: R. 2+; L. 2+.  There is cap refill noted less than two seconds in all digits. There is not edema of the bilateral lower extremities.  There is not varicosities noted in the distal extremities.      Lymph:  Upon palpation,  there is no lymphadenopathy noted in bilateral lower extremities.      Musculoskeletal:  Gait: antalgic; examination of the nails and digits reveal no cyanosis or clubbing.    Lumbar exam:  On visual inspection, there is not deformity of the spine.   full range of motion, no tenderness, palpable spasm or pain on motion. Special tests: Straight Leg Raise negative, Faber test negative.      Hip exam:   Upon inspection, there is not deformity noted.  Upon palpation there is not tenderness.  ROM: is  full and symmetrical.   Strength: Hip  Flexors 5/5; Hip Abductors 5/5; Hip Adduction 5/5.     Knee  exam:  Bilateral knee exam shows;  range of motion of R. Knee is 0 to 120, and L. Knee is 0 to 120. The patient does have  pain on motion, effusion is mild, there is tenderness over the  patellar region, there are not any masses, there is not ligamentous instability, there is not  deformity noted.    Knee exam: bilateral positive for moderate crepitations, some mild tenderness laxity is not noted with any stress.  There is not a popliteal cyst.    R. Knee:  Lachman's negative, Anterior Drawer negative, Posterior Drawer negative  McMurray's negative, Thallasy  negative,   PF grind test positive, Apprehension test negative, Patellar J sign  negative  L. Knee:  Lachman's negative, Anterior Drawer negative, Posterior Drawer negative  McMurray's negative, Thallasy  negative,   PF grind test positive, Apprehension test negative,  Patellar J sign  negative    Xray Exam:  Moderate tricompartmental degenerative changes are noted,   greatest in the anterior compartment. There is no joint effusion.   There is normal alignment. The patella is well located. No fractures   are identified.       Radiographic findings reviewed with patient    Assessment:  Encounter Diagnoses   Name Primary?   . Primary osteoarthritis of right knee Yes   . Primary osteoarthritis of left knee        Plan:  HEP  WBAT  Fu prn

## 2017-12-19 ENCOUNTER — Ambulatory Visit (INDEPENDENT_AMBULATORY_CARE_PROVIDER_SITE_OTHER): Payer: Medicare Other | Admitting: Family Medicine

## 2017-12-19 ENCOUNTER — Encounter: Payer: Self-pay | Admitting: Family Medicine

## 2017-12-19 VITALS — BP 116/64 | HR 62 | Temp 97.5°F | Resp 16 | Ht 74.0 in | Wt 236.0 lb

## 2017-12-19 DIAGNOSIS — I2510 Atherosclerotic heart disease of native coronary artery without angina pectoris: Secondary | ICD-10-CM | POA: Insufficient documentation

## 2017-12-19 DIAGNOSIS — G459 Transient cerebral ischemic attack, unspecified: Secondary | ICD-10-CM

## 2017-12-19 DIAGNOSIS — Z951 Presence of aortocoronary bypass graft: Secondary | ICD-10-CM

## 2017-12-19 DIAGNOSIS — C61 Malignant neoplasm of prostate: Secondary | ICD-10-CM | POA: Diagnosis not present

## 2017-12-19 DIAGNOSIS — E785 Hyperlipidemia, unspecified: Secondary | ICD-10-CM | POA: Diagnosis not present

## 2017-12-19 MED ORDER — CLOPIDOGREL BISULFATE 75 MG PO TABS: 75.0000 mg | ORAL_TABLET | Freq: Every day | ORAL | 3 refills | Status: DC

## 2017-12-19 NOTE — Assessment & Plan Note (Signed)
Patient was being followed by urology annually Will request records from previous PCP and Urologist Will refer to local Urologist for continued follow-up Will await previous records prior to checking next PSA

## 2017-12-19 NOTE — Assessment & Plan Note (Signed)
Continue simvastatin  Given h/o TIA and CAD/CABG, needs tight control of risk factors Will obtain last labs from previous PCP before checking FLP again

## 2017-12-19 NOTE — Progress Notes (Signed)
Patient: Matthew Bryan, Male    DOB: 10/24/1928, 84 y.o.   MRN: [id number] Visit Date: 12/19/2017  Today's Provider: Lavon Paganini, MD   I, Matthew Bryan, CMA, am acting as Matthew Bryan for Matthew Paganini, MD.  Chief Complaint  Patient presents with  . Establish Care   Subjective:    Establish Care Matthew Bryan is a 84 y.o. male who presents today to establish care. He feels well. He reports exercising none. He reports he is sleeping well.  Pt recently moved to the area from [place]. His PMH includes a previous heart attack s/p CABG x3 45yrs ago and a TIA with slurred speech 3 yrs ago. He is taking Plavix (started after TIA), Simvastatin 80 mg, and ASA 81 mg po qd.  Thinks last LDL was 89. Taking simvastatin.  Was following with cardiology annually.  H/o prostate cancer with biopsy 7-8 yrs ago.  Has been being monitored WITH regular PSAs (last one ~8). Has never had radiation/chemo/other treatment.  -----------------------------------------------------------------   Review of Systems  Constitutional: Negative.   HENT: Negative.   Eyes: Negative.   Respiratory: Negative.   Cardiovascular: Negative.   Gastrointestinal: Negative.   Endocrine: Negative.   Genitourinary: Negative.   Musculoskeletal: Negative.   Skin: Negative.   Allergic/Immunologic: Negative.   Neurological: Negative.   Hematological: Negative.   Psychiatric/Behavioral: Negative.     Social History      He  reports that he quit smoking about 54 years ago. His smoking use included cigarettes. He has a 10.00 pack-year smoking history. He has never used smokeless tobacco. He reports that he does not drink alcohol or use drugs.       Social History   Socioeconomic History  . Marital status: Married    Spouse name: Matthew Bryan  . Number of children: 3  . Years of education: Not on file  . Highest education level: Not on file  Occupational History  . Occupation: Retired  Scientific laboratory technician  . Financial  resource strain: Not hard at all  . Food insecurity:    Worry: Never true    Inability: Never true  . Transportation needs:    Medical: No    Non-medical: No  Tobacco Use  . Smoking status: Former Smoker    Packs/day: 1.00    Years: 10.00    Pack years: 10.00    Types: Cigarettes    Last attempt to quit: 09/11/1963    Years since quitting: 54.3  . Smokeless tobacco: Never Used  Substance and Sexual Activity  . Alcohol use: Never    Frequency: Never  . Drug use: Never  . Sexual activity: Not on file  Lifestyle  . Physical activity:    Days per week: 0 days    Minutes per session: 0 min  . Stress: Not on file  Relationships  . Social connections:    Talks on phone: Not on file    Gets together: Not on file    Attends religious service: Not on file    Active member of club or organization: Not on file    Attends meetings of clubs or organizations: Not on file    Relationship status: Not on file  Other Topics Concern  . Not on file  Social History Narrative  . Not on file    Past Medical History:  Diagnosis Date  . History of heart attack   . TIA (transient ischemic attack)      There are no active  problems to display for this patient.   Past Surgical History:  Procedure Laterality Date  . CORONARY ARTERY BYPASS GRAFT      Family History        Family Status  Relation Name Status  . Mother  Deceased  . Father  Deceased  . Brother  Deceased  . Daughter  Alive  . Son  Alive  . Daughter  Deceased        His family history includes Heart attack in his daughter; Prostate cancer in his brother; Stroke in his mother.      No Known Allergies   Current Outpatient Medications:  .  aspirin EC 81 MG tablet, Take 81 mg by mouth daily., Disp: , Rfl:  .  clopidogrel (PLAVIX) 75 MG tablet, Take 75 mg by mouth daily., Disp: , Rfl:  .  simvastatin (ZOCOR) 80 MG tablet, Take 80 mg by mouth daily., Disp: , Rfl:    Patient Care Team: Matthew Crews, MD as  PCP - General (Family Medicine)      Objective:   Vitals: BP 116/64 (BP Location: Left Arm, Patient Position: Sitting, Cuff Size: Large)   Pulse 62   Temp (!) 97.5 F (36.4 C) (Oral)   Resp 16   Ht 6\' 2"  (1.88 m)   Wt 236 lb (107 kg)   SpO2 97%   BMI 30.30 kg/m    Vitals:   12/19/17 1445  BP: 116/64  Pulse: 62  Resp: 16  Temp: (!) 97.5 F (36.4 C)  TempSrc: Oral  SpO2: 97%  Weight: 236 lb (107 kg)  Height: 6\' 2"  (1.88 m)     Physical Exam  Constitutional: He is oriented to person, place, and time. He appears well-developed and well-nourished. No distress.  HENT:  Head: Normocephalic and atraumatic.  Right Ear: External ear normal.  Left Ear: External ear normal.  Nose: Nose normal.  Mouth/Throat: Oropharynx is clear and moist.  + hearing aids  Eyes: Pupils are equal, round, and reactive to light. Conjunctivae and EOM are normal. No scleral icterus.  Neck: Neck supple. No thyromegaly present.  Cardiovascular: Normal rate, regular rhythm, normal heart sounds and intact distal pulses.  No murmur heard. Pulmonary/Chest: Breath sounds normal. No respiratory distress. He has no wheezes. He has no rales.  Abdominal: Soft. Bowel sounds are normal. He exhibits no distension. There is no tenderness. There is no rebound and no guarding.  Musculoskeletal: He exhibits no edema or deformity.  Lymphadenopathy:    He has no cervical adenopathy.  Neurological: He is alert and oriented to person, place, and time.  Skin: Skin is warm and dry. Capillary refill takes less than 2 seconds. No rash noted.  Psychiatric: He has a normal mood and affect. His behavior is normal.  Vitals reviewed.    Depression Screen PHQ 2/9 Scores 12/19/2017  PHQ - 2 Score 0      Assessment & Plan:    Problem List Items Addressed This Visit      Cardiovascular and Mediastinum   TIA (transient ischemic attack)    History of TIA several years ago Stressed importance of controlling risk factors  for stroke Continue Plavix and aspirin      Relevant Medications   simvastatin (ZOCOR) 80 MG tablet   aspirin EC 81 MG tablet     Genitourinary   Prostate cancer [facility])    Patient was being followed by urology annually Will request records from previous PCP and Urologist Will refer to local  Urologist for continued follow-up Will await previous records prior to checking next PSA      Relevant Medications   aspirin EC 81 MG tablet   Other Relevant Orders   Ambulatory referral to Urology     Other   Hx of CABG    Referral to cardiology for annual monitoring Continue current meds Needs tight control of risk factors      Relevant Orders   Ambulatory referral to Cardiology   Hyperlipidemia - Primary    Continue simvastatin  Given h/o TIA and CAD/CABG, needs tight control of risk factors Will obtain last labs from previous PCP before checking FLP again      Relevant Medications   simvastatin (ZOCOR) 80 MG tablet   aspirin EC 81 MG tablet       Return in about 6 months (around 06/20/2018) for AWV/CPE.   The entirety of the information documented in the History of Present Illness, Review of Systems and Physical Exam were personally obtained by me. Portions of this information were initially documented by Raquel Sarna Ratchford, CMA and reviewed by me for thoroughness and accuracy.    Matthew Crews, MD, MPH [facility] 12/19/2017 3:54 PM

## 2017-12-19 NOTE — Assessment & Plan Note (Signed)
History of TIA several years ago Stressed importance of controlling risk factors for stroke Continue Plavix and aspirin

## 2017-12-19 NOTE — Assessment & Plan Note (Signed)
Referral to cardiology for annual monitoring Continue current meds Needs tight control of risk factors

## 2017-12-26 ENCOUNTER — Emergency Department
Admission: EM | Admit: 2017-12-26 | Discharge: 2017-12-26 | Disposition: A | Discharge status: Home / Self Care | Payer: Medicare Other | Attending: Emergency Medicine | Admitting: Emergency Medicine

## 2017-12-26 ENCOUNTER — Other Ambulatory Visit: Payer: Self-pay

## 2017-12-26 ENCOUNTER — Encounter: Payer: Self-pay | Admitting: Emergency Medicine

## 2017-12-26 ENCOUNTER — Emergency Department: Payer: Medicare Other

## 2017-12-26 DIAGNOSIS — Z951 Presence of aortocoronary bypass graft: Secondary | ICD-10-CM | POA: Insufficient documentation

## 2017-12-26 DIAGNOSIS — Z7982 Long term (current) use of aspirin: Secondary | ICD-10-CM | POA: Diagnosis not present

## 2017-12-26 DIAGNOSIS — R079 Chest pain, unspecified: Secondary | ICD-10-CM | POA: Diagnosis present

## 2017-12-26 DIAGNOSIS — R0789 Other chest pain: Secondary | ICD-10-CM | POA: Diagnosis not present

## 2017-12-26 DIAGNOSIS — Z7902 Long term (current) use of antithrombotics/antiplatelets: Secondary | ICD-10-CM | POA: Diagnosis not present

## 2017-12-26 DIAGNOSIS — Z87891 Personal history of nicotine dependence: Secondary | ICD-10-CM | POA: Insufficient documentation

## 2017-12-26 DIAGNOSIS — Z79899 Other long term (current) drug therapy: Secondary | ICD-10-CM | POA: Diagnosis not present

## 2017-12-26 DIAGNOSIS — R0781 Pleurodynia: Secondary | ICD-10-CM

## 2017-12-26 LAB — BASIC METABOLIC PANEL
Anion gap: 8 (ref 5–15)
BUN: 22 mg/dL — ABNORMAL HIGH (ref 6–20)
CALCIUM: 8.4 mg/dL — AB (ref 8.9–10.3)
CHLORIDE: 105 mmol/L (ref 101–111)
CO2: 23 mmol/L (ref 22–32)
CREATININE: 1.04 mg/dL (ref 0.61–1.24)
GFR calc Af Amer: >60 mL/min (ref >60)
GFR calc non Af Amer: >60 mL/min (ref >60)
GLUCOSE: 120 mg/dL — AB (ref 65–99)
Potassium: 4 mmol/L (ref 3.5–5.1)
Sodium: 136 mmol/L (ref 135–145)

## 2017-12-26 LAB — CBC
HCT: 37.6 % — ABNORMAL LOW (ref 40.0–52.0)
HEMOGLOBIN: 12.7 g/dL — AB (ref 13.0–18.0)
MCH: 29.4 pg (ref 26.0–34.0)
MCHC: 33.7 g/dL (ref 32.0–36.0)
MCV: 87.4 fL (ref 80.0–100.0)
PLATELETS: 191 10*3/uL (ref 150–440)
RBC: 4.3 MIL/uL — AB (ref 4.40–5.90)
RDW: 15.7 % — ABNORMAL HIGH (ref 11.5–14.5)
WBC: 11 10*3/uL — ABNORMAL HIGH (ref 3.8–10.6)

## 2017-12-26 LAB — TROPONIN I

## 2017-12-26 NOTE — ED Triage Notes (Signed)
Pt arrived with wife with complaints of left sided rib cage pain. Pt states he fall on Saturday and it has hurt since. Pt states the pain intensifies with breathing. Pt in NAD in triage.

## 2017-12-26 NOTE — ED Provider Notes (Signed)
[facility] Emergency Department Provider Note   ____________________________________________   I have reviewed the triage vital signs and the nursing notes.   HISTORY  Chief Complaint Chest Pain   History limited by: Not Limited   HPI Matthew Bryan is a 84 y.o. male who presents to the emergency department today because of concerns for left rib pain.  Patient states the pain started 3 days ago after a fall.  He states that he fell onto this left side with his left elbow between his ribs and the ground.  He states that the pain did start right after the fall however it was not severe.  It only became severe today however by the time my exam he says that it had eased off.  Patient did have some difficulty with breathing when the pain became severe stating that he could not take a full deep breath.  He however denies any cough or fevers.  He denies any other injuries or pain from the fall.   Per medical record review patient has a history of hx of cabg.  Past Medical History:  Diagnosis Date  . History of heart attack   . Hyperlipidemia   . Prostate cancer ([place])   . TIA (transient ischemic attack)     Patient Active Problem List   Diagnosis Date Noted  . Prostate cancer ([place]) 12/19/2017  . TIA (transient ischemic attack) 12/19/2017  . Hx of CABG 12/19/2017  . Hyperlipidemia 12/19/2017    Past Surgical History:  Procedure Laterality Date  . CORONARY ARTERY BYPASS GRAFT      Prior to Admission medications   Medication Sig Start Date End Date Taking? Authorizing Provider  aspirin EC 81 MG tablet Take 81 mg by mouth daily.    [provider]  clopidogrel (PLAVIX) 75 MG tablet Take 1 tablet (75 mg total) by mouth daily. 12/19/17   Virginia Crews, MD  simvastatin (ZOCOR) 80 MG tablet Take 80 mg by mouth daily.    [provider]    Allergies Patient has no known allergies.  Family History  Problem Relation Age of Onset  .  Stroke Mother   . Heart attack Father   . Prostate cancer Brother 73  . Heart attack Daughter     Social History Social History   Tobacco Use  . Smoking status: Former Smoker    Packs/day: 1.00    Years: 10.00    Pack years: 10.00    Types: Cigarettes    Last attempt to quit: 09/11/1963    Years since quitting: 54.3  . Smokeless tobacco: Never Used  Substance Use Topics  . Alcohol use: Never    Frequency: Never  . Drug use: Never    Review of Systems Constitutional: No fever/chills Eyes: No visual changes. ENT: No sore throat. Cardiovascular: Positive for chest pain. Respiratory: Denies shortness of breath. Gastrointestinal: No abdominal pain.  No nausea, no vomiting.  No diarrhea.   Genitourinary: Negative for dysuria. Musculoskeletal: Negative for back pain. Skin: Negative for rash. Neurological: Negative for headaches, focal weakness or numbness.  ____________________________________________   PHYSICAL EXAM:  VITAL SIGNS: ED Triage Vitals  Enc Vitals Group     BP 12/26/17 1740 (!) 167/94     Pulse Rate 12/26/17 1740 66     Resp 12/26/17 1740 18     Temp 12/26/17 1740 98.1 F (36.7 C)     Temp Source 12/26/17 1740 Oral     SpO2 12/26/17 1740 97 %  Weight 12/26/17 1742 236 lb (107 kg)     Height 12/26/17 1742 6\' 2"  (1.88 m)     Head Circumference --      Peak Flow --      Pain Score 12/26/17 1741 8   Constitutional: Alert and oriented. Well appearing and in no distress. Eyes: Conjunctivae are normal.  ENT   Head: Normocephalic and atraumatic.   Nose: No congestion/rhinnorhea.   Mouth/Throat: Mucous membranes are moist.   Neck: No stridor. Cardiovascular: Normal rate, regular rhythm.  No murmurs, rubs, or gallops.  Respiratory: Normal respiratory effort without tachypnea nor retractions. Breath sounds are clear and equal bilaterally. No wheezes/rales/rhonchi. Gastrointestinal: Soft and non tender. No rebound. No guarding.   Genitourinary: Deferred Musculoskeletal: Normal range of motion in all extremities.  Mild tenderness to palpation over the left lower ribs. Neurologic:  Normal speech and language. No gross focal neurologic deficits are appreciated.  Skin:  Skin is warm, dry and intact. No bruising noted over left chest wall. No crepitus.  Psychiatric: Mood and affect are normal. Speech and behavior are normal. Patient exhibits appropriate insight and judgment.  ____________________________________________    LABS (pertinent positives/negatives)  CBC wbc 11.0, hgb 12.7, plt 191 BMP na 136, glu 120, cr 1.04 Trop <0.03 ____________________________________________   EKG  I, Nance Pear, attending physician, personally viewed and interpreted this EKG  EKG Time: 1747 Rate: 63 Rhythm: sinus rhythm with pac Axis: normal Intervals: qtc 493 QRS: RBBB ST changes: no st elevation Impression: normal ekg   ____________________________________________    RADIOLOGY  CXR No acute disease  ____________________________________________   PROCEDURES  Procedures  ____________________________________________   INITIAL IMPRESSION / ASSESSMENT AND PLAN / ED COURSE  Pertinent labs & imaging results that were available during my care of the patient were reviewed by me and considered in my medical decision making (see chart for details).  Patient presented to the emergency department today because of concerns for left sided chest wall pain after a fall 3 days ago.  X-ray did not show any obvious fracture or pneumothorax.  On exam there is no bruising.  Very minimal tenderness.  Patient in no respiratory distress.  At this point I doubt significant fracture.  Did discuss possibility of a small hairline fracture however is now been 3 days since injury.  Additionally doubt significant splenic injury given normal vital signs.  Discussed with patient concern for possible pneumonia anytime there is chest  wall injury.  Will have nurse both given explain incentive spirometry use.    ____________________________________________   FINAL CLINICAL IMPRESSION(S) / ED DIAGNOSES  Final diagnoses:  Rib pain on left side     Note: This dictation was prepared with Dragon dictation. Any transcriptional errors that result from this process are unintentional     Nance Pear, MD 12/26/17 2123

## 2017-12-26 NOTE — Discharge Instructions (Addendum)
Please seek medical attention for any high fevers, chest pain, shortness of breath, change in behavior, persistent vomiting, bloody stool or any other new or concerning symptoms.  

## 2017-12-26 NOTE — ED Notes (Signed)
RN talked to pt and family about incentive spirometer. PT verbalized understanding and demonstrated use.

## 2018-01-09 ENCOUNTER — Encounter: Payer: Self-pay | Admitting: Family Medicine

## 2018-01-09 NOTE — Progress Notes (Signed)
Patient's medical records downloaded electronically from [employer] sent from [facility] from previous PCP.  Medical history and medications were confirmed with the medical records.  Important information was abstracted into the electronic medical record.  Important points are as below:  -Patient has refused colonoscopy previously.  Did have a normal sigmoidoscopy in 12/2016 that was done for rectal bleeding.  Records can be downloaded again from [email or web address] - will need street number ([id number]), zipcode ([id number]), and verification code ([email or web address])  Eliabeth Shoff, Dionne Bucy, MD, MPH [facility] 01/09/2018 3:20 PM

## 2018-01-16 DIAGNOSIS — H40153 Residual stage of open-angle glaucoma, bilateral: Secondary | ICD-10-CM | POA: Diagnosis not present

## 2018-01-23 ENCOUNTER — Ambulatory Visit: Payer: Medicare Other | Admitting: Urology

## 2018-01-23 ENCOUNTER — Encounter: Payer: Self-pay | Admitting: Urology

## 2018-01-23 VITALS — BP 190/66 | HR 81 | Ht 74.0 in | Wt 237.4 lb

## 2018-01-23 DIAGNOSIS — N476 Balanoposthitis: Secondary | ICD-10-CM

## 2018-01-23 DIAGNOSIS — R351 Nocturia: Secondary | ICD-10-CM | POA: Diagnosis not present

## 2018-01-23 DIAGNOSIS — N401 Enlarged prostate with lower urinary tract symptoms: Secondary | ICD-10-CM

## 2018-01-23 DIAGNOSIS — C61 Malignant neoplasm of prostate: Secondary | ICD-10-CM | POA: Diagnosis not present

## 2018-01-23 MED ORDER — NYSTATIN-TRIAMCINOLONE 100000-0.1 UNIT/GM-% EX OINT: 1.0000 "application " | TOPICAL_OINTMENT | Freq: Two times a day (BID) | CUTANEOUS | 1 refills | Status: AC

## 2018-01-23 NOTE — Progress Notes (Signed)
01/23/2018 10:09 AM   Matthew Bryan 19-Dec-1928 [id number]  Referring provider: Virginia Crews, [place], [place] [address]  Chief Complaint  Patient presents with  . Prostate Cancer    HPI: 84 year old male here to establish care for history of prostate cancer, BPH, and balanoposthitis.  He recently moved to the area from [place].  He was followed by Dr. Camillia Herter, Urology..    Per the patient, he was diagnosed with prostate cancer around 20 years ago.   He had prostate biopsy by his local urologist.  He said no treatment.  He has been followed with serial PSA.  There is no recent PSAs available.  Per the patient, his most recent PSA was approximately 8 which was approximately one year ago.  Additional records have been requested today.  He does have a family history of prostate cancer, his brother died of prostate cancer.  He does also have a personal history of BPH and is on Flomax.  He denies any significant urinary symptoms on this medication including no frequency, urgency, weak stream, or sensation of incomplete bladder emptying.  He does get up 2-3 times at night to void but this is not bothersome to him.  He is pleased with his voiding symptoms.  No dysuria or hematuria.  He is uncircumcised and has a rash on his foreskin that comes and goes.  He has a antifungal/steroid cream prescribed by his previous urologist which he uses intermittently.  He is run out of this medication.  He would like a refill for this prescription.  He is not interested in circumcision.  Extensive 50+ page review of records revealed history of UTI in 2018 as well as history of prostate cancer but no specific information was included in these records including no Gleason score, date of biopsy, or recent PSA data.   PMH: Past Medical History:  Diagnosis Date  . History of heart attack   . Hyperlipidemia   . Prostate cancer ([place])   . Sepsis due to urinary tract infection  ([place])   . TIA (transient ischemic attack)     Surgical History: Past Surgical History:  Procedure Laterality Date  . CORONARY ARTERY BYPASS GRAFT      Home Medications:  Allergies as of 01/23/2018   No Known Allergies     Medication List        Accurate as of 01/23/18 10:09 AM. Always use your most recent med list.          aspirin EC 81 MG tablet Take 81 mg by mouth daily.   clopidogrel 75 MG tablet Commonly known as:  PLAVIX Take 1 tablet (75 mg total) by mouth daily.   nystatin-triamcinolone ointment Commonly known as:  MYCOLOG Apply 1 application topically 2 (two) times daily.   simvastatin 80 MG tablet Commonly known as:  ZOCOR Take 80 mg by mouth daily.   TRAVATAN Z 0.004 % Soln ophthalmic solution Generic drug:  Travoprost (BAK Free) INT 1 GTT INTO OU HS       Allergies: No Known Allergies  Family History: Family History  Problem Relation Age of Onset  . Stroke Mother   . Heart attack Father   . Prostate cancer Brother 45  . Heart attack Daughter   . Kidney cancer Neg Hx   . Bladder Cancer Neg Hx     Social History:  reports that he quit smoking about 54 years ago. His smoking use included cigarettes. He has a 10.00  pack-year smoking history. He has never used smokeless tobacco. He reports that he does not drink alcohol or use drugs.  ROS: UROLOGY Frequent Urination?: Yes Hard to postpone urination?: No Burning/pain with urination?: No Get up at night to urinate?: Yes Leakage of urine?: No Urine stream starts and stops?: No Trouble starting stream?: No Do you have to strain to urinate?: No Blood in urine?: No Urinary tract infection?: No Sexually transmitted disease?: No Injury to kidneys or bladder?: No Painful intercourse?: No Weak stream?: No Erection problems?: No Penile pain?: No  Gastrointestinal Nausea?: No Vomiting?: No Indigestion/heartburn?: No Diarrhea?: No Constipation?: No  Constitutional Fever: No Night  sweats?: No Weight loss?: No Fatigue?: Yes  Skin Skin rash/lesions?: No Itching?: No  Eyes Blurred vision?: No Double vision?: No  Ears/Nose/Throat Sore throat?: No Sinus problems?: No  Hematologic/Lymphatic Swollen glands?: No Easy bruising?: Yes  Cardiovascular Leg swelling?: No Chest pain?: No  Respiratory Cough?: No Shortness of breath?: Yes  Endocrine Excessive thirst?: No  Musculoskeletal Back pain?: No Joint pain?: Yes  Neurological Headaches?: No Dizziness?: No  Psychologic Depression?: No Anxiety?: No  Physical Exam: BP (!) 190/66 (BP Location: Left Arm, Patient Position: Sitting, Cuff Size: Normal)   Pulse 81   Ht 6\' 2"  (1.88 m)   Wt 237 lb 6.4 oz (107.7 kg)   BMI 30.48 kg/m   Constitutional:  Alert and oriented, No acute distress. HEENT: [place] AT, moist mucus membranes.  Trachea midline, no masses. Cardiovascular: No clubbing, cyanosis, or edema. Respiratory: Normal respiratory effort, no increased work of breathing. GI: Abdomen is soft, nontender, nondistended, no abdominal masses GU: Bilateral descended testicles without masses.  Uncircumcised phallus with ease retractable foreskin, slight erythema on foreskin and glans with ointment noted. Skin: No rashes, bruises or suspicious lesions. Neurologic: Grossly intact, no focal deficits, moving all 4 extremities. Psychiatric: Normal mood and affect.  Laboratory Data: Lab Results  Component Value Date   WBC 11.0 (H) 12/26/2017   HGB 12.7 (L) 12/26/2017   HCT 37.6 (L) 12/26/2017   MCV 87.4 12/26/2017   PLT 191 12/26/2017    Lab Results  Component Value Date   CREATININE 1.04 12/26/2017    Urinalysis N/a - Deferred today as patient is asymptomatic and admitted to voiding through his phimotic foreskin  Pertinent Imaging: Na  Assessment & Plan:    1. Prostate cancer [facility]) Per patient report, presumably diagnosed quite some time ago with what sounds like low-grade disease on active  surveillance We will obtain a PSA today for baseline Records from outside urologist requested, Dr. Camillia Herter Given his age, will likely continue annual surviellence - PSA  2. BPH associated with nocturia Doing well on flomax, continue this medication  3. Balanoposthitis Discussed option of circumcision, not interested Refilled mycolog cream to be used prn   Return for Please sign release for records from Dr. Camillia Herter office.  F/u 1 year for PSA  Hollice Espy, MD  [place] [address] [employer] [place], [place] [address] [phone]

## 2018-01-24 ENCOUNTER — Telehealth: Payer: Self-pay

## 2018-01-24 LAB — PSA: PROSTATE SPECIFIC AG, SERUM: 6.5 ng/mL — AB (ref 0.0–4.0)

## 2018-01-24 NOTE — Telephone Encounter (Signed)
-----   Message from Hollice Espy, MD sent at 01/24/2018  9:09 AM EDT ----- PSA is excellent, 6.5.  This is actually a fairly appropriate PSA for your age.  We are still awaiting records from your previous urologist.  Hollice Espy, MD

## 2018-01-24 NOTE — Telephone Encounter (Signed)
Patient notified

## 2018-02-12 ENCOUNTER — Ambulatory Visit: Payer: Self-pay | Admitting: Cardiovascular Disease

## 2018-02-12 ENCOUNTER — Ambulatory Visit: Payer: Medicare Other | Admitting: Cardiovascular Disease

## 2018-02-12 ENCOUNTER — Encounter: Payer: Self-pay | Admitting: Cardiovascular Disease

## 2018-02-12 VITALS — BP 142/70 | Ht 74.0 in | Wt 242.0 lb

## 2018-02-12 DIAGNOSIS — R2681 Unsteadiness on feet: Secondary | ICD-10-CM | POA: Diagnosis not present

## 2018-02-12 DIAGNOSIS — C61 Malignant neoplasm of prostate: Secondary | ICD-10-CM

## 2018-02-12 DIAGNOSIS — I25118 Atherosclerotic heart disease of native coronary artery with other forms of angina pectoris: Secondary | ICD-10-CM | POA: Diagnosis not present

## 2018-02-12 DIAGNOSIS — G459 Transient cerebral ischemic attack, unspecified: Secondary | ICD-10-CM

## 2018-02-12 DIAGNOSIS — E782 Mixed hyperlipidemia: Secondary | ICD-10-CM | POA: Diagnosis not present

## 2018-02-12 DIAGNOSIS — Z951 Presence of aortocoronary bypass graft: Secondary | ICD-10-CM

## 2018-02-12 DIAGNOSIS — W19XXXS Unspecified fall, sequela: Secondary | ICD-10-CM | POA: Insufficient documentation

## 2018-02-12 MED ORDER — SIMVASTATIN 40 MG PO TABS: 40.0000 mg | ORAL_TABLET | Freq: Every day | ORAL | 3 refills | Status: DC

## 2018-02-12 NOTE — Progress Notes (Signed)
Cardiology Office Note  Date:  02/12/2018   ID:  Matthew Bryan, DOB May 25, 1929, MRN [id number]  PCP:  Virginia Crews, MD   Chief Complaint  Patient presents with  . New Patient (Initial Visit)    Referred by Dr. Brita Romp for Hx of CABG. Patient denies chest pain and SOB. Meds reviewed verbally with patient.     HPI:  Mr. Matthew Bryan is a 84 year old gentleman with past medical history of CAD,s/p CABG x3  , 29yrs ago Hyperlipidemia HTN Prostate CA TIA with slurred speech 3 yrs ago. Former smoker, 10 years, quit 1964 Gait instability Who presents by referral from [place] to establish care for his coronary disease and history of bypass surgery  He recently moved to the area from [place].  Recent visit to the ER 12/26/2017 Fall, broken rib Reports symptoms have improved back to baseline  Several years ago was sitting at a table had slurred speech Seem to resolve after short period of time without intervention Described as having TIA No problems since that time  He is unclear on his cardiac interventions for Testing over the past 20 years Records have been requested Cc may have had Angioplasty 5 years after bypass, unsure of details  Denies any anginal symptoms at this time  Prior to CABG, had bad indigestion None recently, no indigestion  EKG personally reviewed by myself on todays visit Showing normal sinus rhythm right bundle Bryan block APCs T-wave abnormality in lead 3 aVF ST-T wave abnormality through  V1 through V3 EKG dated 12/26/2017 He declined EKG today   PMH:   has a past medical history of History of heart attack, Hyperlipidemia, Prostate cancer ([place]), Sepsis due to urinary tract infection ([place]), and TIA (transient ischemic attack).  PSH:    Past Surgical History:  Procedure Laterality Date  . CORONARY ARTERY BYPASS GRAFT      Current Outpatient Medications  Medication Sig Dispense Refill  . aspirin EC 81 MG tablet Take 81 mg by mouth daily.    .  clopidogrel (PLAVIX) 75 MG tablet Take 1 tablet (75 mg total) by mouth daily. 90 tablet 3  . nystatin-triamcinolone ointment (MYCOLOG) Apply 1 application topically 2 (two) times daily. 30 g 1  . simvastatin (ZOCOR) 40 MG tablet Take 1 tablet (40 mg total) by mouth daily. 90 tablet 3  . TRAVATAN Z 0.004 % SOLN ophthalmic solution INT 1 GTT INTO OU HS  4  . tamsulosin (FLOMAX) 0.4 MG CAPS capsule Take 1 capsule (0.4 mg total) by mouth. 30 capsule    No current facility-administered medications for this visit.      Allergies:   Patient has no known allergies.   Social History:  The patient  reports that he quit smoking about 53 years ago. His smoking use included cigarettes. He has a 10.00 pack-year smoking history. He has never used smokeless tobacco. He reports that he does not drink alcohol or use drugs.   Family History:   family history includes Heart attack in his daughter and father; Prostate cancer (age of onset: 25) in his brother; Stroke in his mother.    Review of Systems: Review of Systems  Constitutional: Negative.   Respiratory: Negative.   Cardiovascular: Negative.   Gastrointestinal: Negative.   Musculoskeletal: Positive for falls.       Gait instability  Neurological: Negative.   Psychiatric/Behavioral: Negative.   All other systems reviewed and are negative.    PHYSICAL EXAM: VS:  BP (!) 142/70 (BP Location: Left Arm,  Patient Position: Sitting, Cuff Size: Large)   Ht 6\' 2"  (1.88 m)   Wt 242 lb (109.8 kg)   BMI 31.07 kg/m  , BMI Body mass index is 31.07 kg/m. GEN: Well nourished, well developed, in no acute distress  HEENT: normal  Neck: no JVD, carotid bruits, or masses Cardiac: RRR; no murmurs, rubs, or gallops,no edema  Respiratory:  clear to auscultation bilaterally, normal work of breathing GI: soft, nontender, nondistended, + BS MS: no deformity or atrophy  Skin: warm and dry, no rash Neuro:  Strength and sensation are intact Psych: euthymic mood,  full affect    Recent Labs: 05/18/2017: ALT 14 12/26/2017: BUN 22; Creatinine, Ser 1.04; Hemoglobin 12.7; Platelets 191; Potassium 4.0; Sodium 136    Lipid Panel No results found for: CHOL, HDL, LDLCALC, TRIG    Wt Readings from Last 3 Encounters:  02/12/18 242 lb (109.8 kg)  01/23/18 237 lb 6.4 oz (107.7 kg)  12/26/17 236 lb (107 kg)       ASSESSMENT AND PLAN:  Coronary artery disease of native artery of native heart with stable angina pectoris (HCC) -  Currently with no symptoms of angina. No further workup at this time. Continue current medication regimen. long discussion concerning anginal symptoms Strongly recommended he call us for any indigestion symptoms concerning for angina  Mixed hyperlipidemia No recent lipid panel available Records reviewed from prior cardiologist LDL above goal Recommended he add Zetia 10 mg daily to his simvastatin 20 mg daily but he prefers to increase simvastatin up to 40 mg daily If we do not achieve goal LDL less than 70 would add Zetia .  He is willing to do this  Hx of CABG -  No anginal symptoms as above Stressed importance of aggressive lipid management  TIA (transient ischemic attack) Currently on aspirin and Plavix Denies any recurrent stroke or TIA symptoms  Unsteady gait Recommended a walking program for leg strengthening Recent fall  Disposition:   F/U  6 months   Total encounter time more than 60 minutes  Greater than 50% was spent in counseling and coordination of care with the patient   No orders of the defined types were placed in this encounter.    Signed, Esmond Plants, M.D., Ph.D. 02/12/2018  [place], [place]

## 2018-02-12 NOTE — Patient Instructions (Addendum)
Medication Instructions:   Try simvastatin 40 mg once a day  Labwork:  No new labs needed  Testing/Procedures:  No further testing at this time   Follow-Up: It was a pleasure seeing you in the office today. Please call us if you have new issues that need to be addressed before your next appt.  [phone]  Your physician wants you to follow-up in: 12 months.  You will receive a reminder letter in the mail two months in advance. If you don't receive a letter, please call our office to schedule the follow-up appointment.  If you need a refill on your cardiac medications before your next appointment, please call your pharmacy.  For educational health videos Log in to : [email or web address] Or : [email or web address], password : [email or web address]

## 2018-03-09 ENCOUNTER — Emergency Department: Payer: Medicare Other

## 2018-03-09 ENCOUNTER — Observation Stay
Admission: EM | Admit: 2018-03-09 | Discharge: 2018-03-12 | Disposition: A | Discharge status: Home / Self Care | Payer: Medicare Other | Attending: Specialist | Admitting: Specialist

## 2018-03-09 ENCOUNTER — Other Ambulatory Visit: Payer: Self-pay

## 2018-03-09 DIAGNOSIS — Z823 Family history of stroke: Secondary | ICD-10-CM | POA: Insufficient documentation

## 2018-03-09 DIAGNOSIS — I252 Old myocardial infarction: Secondary | ICD-10-CM | POA: Insufficient documentation

## 2018-03-09 DIAGNOSIS — R0789 Other chest pain: Secondary | ICD-10-CM | POA: Diagnosis present

## 2018-03-09 DIAGNOSIS — E785 Hyperlipidemia, unspecified: Secondary | ICD-10-CM | POA: Insufficient documentation

## 2018-03-09 DIAGNOSIS — Z7902 Long term (current) use of antithrombotics/antiplatelets: Secondary | ICD-10-CM | POA: Insufficient documentation

## 2018-03-09 DIAGNOSIS — Z7982 Long term (current) use of aspirin: Secondary | ICD-10-CM | POA: Diagnosis not present

## 2018-03-09 DIAGNOSIS — Z8249 Family history of ischemic heart disease and other diseases of the circulatory system: Secondary | ICD-10-CM | POA: Insufficient documentation

## 2018-03-09 DIAGNOSIS — Z951 Presence of aortocoronary bypass graft: Secondary | ICD-10-CM | POA: Diagnosis not present

## 2018-03-09 DIAGNOSIS — Z87891 Personal history of nicotine dependence: Secondary | ICD-10-CM | POA: Insufficient documentation

## 2018-03-09 DIAGNOSIS — Z8042 Family history of malignant neoplasm of prostate: Secondary | ICD-10-CM | POA: Insufficient documentation

## 2018-03-09 DIAGNOSIS — I2510 Atherosclerotic heart disease of native coronary artery without angina pectoris: Secondary | ICD-10-CM | POA: Diagnosis not present

## 2018-03-09 DIAGNOSIS — Z8546 Personal history of malignant neoplasm of prostate: Secondary | ICD-10-CM | POA: Insufficient documentation

## 2018-03-09 DIAGNOSIS — I25119 Atherosclerotic heart disease of native coronary artery with unspecified angina pectoris: Secondary | ICD-10-CM | POA: Diagnosis not present

## 2018-03-09 DIAGNOSIS — N401 Enlarged prostate with lower urinary tract symptoms: Secondary | ICD-10-CM | POA: Diagnosis not present

## 2018-03-09 DIAGNOSIS — Z8673 Personal history of transient ischemic attack (TIA), and cerebral infarction without residual deficits: Secondary | ICD-10-CM | POA: Diagnosis not present

## 2018-03-09 DIAGNOSIS — Z79899 Other long term (current) drug therapy: Secondary | ICD-10-CM | POA: Insufficient documentation

## 2018-03-09 DIAGNOSIS — I200 Unstable angina: Secondary | ICD-10-CM | POA: Diagnosis not present

## 2018-03-09 DIAGNOSIS — R338 Other retention of urine: Secondary | ICD-10-CM | POA: Insufficient documentation

## 2018-03-09 DIAGNOSIS — R079 Chest pain, unspecified: Secondary | ICD-10-CM | POA: Diagnosis not present

## 2018-03-09 LAB — BASIC METABOLIC PANEL
Anion gap: 7 (ref 5–15)
BUN: 20 mg/dL (ref 8–23)
CO2: 27 mmol/L (ref 22–32)
Calcium: 8.7 mg/dL — ABNORMAL LOW (ref 8.9–10.3)
Chloride: 106 mmol/L (ref 98–111)
Creatinine, Ser: 1.05 mg/dL (ref 0.61–1.24)
GFR calc Af Amer: >60 mL/min (ref >60)
Glucose, Bld: 115 mg/dL — ABNORMAL HIGH (ref 70–99)
POTASSIUM: 4 mmol/L (ref 3.5–5.1)
SODIUM: 140 mmol/L (ref 135–145)

## 2018-03-09 LAB — CBC
HCT: 40.4 % (ref 40.0–52.0)
Hemoglobin: 13.5 g/dL (ref 13.0–18.0)
MCH: 28.9 pg (ref 26.0–34.0)
MCHC: 33.3 g/dL (ref 32.0–36.0)
MCV: 86.6 fL (ref 80.0–100.0)
PLATELETS: 157 10*3/uL (ref 150–440)
RBC: 4.66 MIL/uL (ref 4.40–5.90)
RDW: 15.3 % — ABNORMAL HIGH (ref 11.5–14.5)
WBC: 10.7 10*3/uL — ABNORMAL HIGH (ref 3.8–10.6)

## 2018-03-09 LAB — HEPARIN LEVEL (UNFRACTIONATED): Heparin Unfractionated: 0.38 IU/mL (ref 0.30–0.70)

## 2018-03-09 LAB — LIPID PANEL
Cholesterol: 131 mg/dL (ref 0–200)
HDL: 41 mg/dL (ref >40)
LDL Cholesterol: 78 mg/dL (ref 0–99)
Total CHOL/HDL Ratio: 3.2 RATIO
Triglycerides: 62 mg/dL (ref <150)
VLDL: 12 mg/dL (ref 0–40)

## 2018-03-09 LAB — PROTIME-INR
INR: 1.03
Prothrombin Time: 13.4 seconds (ref 11.4–15.2)

## 2018-03-09 LAB — HEMOGLOBIN A1C
Hgb A1c MFr Bld: 6 % — ABNORMAL HIGH (ref 4.8–5.6)
Mean Plasma Glucose: 125.5 mg/dL

## 2018-03-09 LAB — APTT: APTT: 29 s (ref 24–36)

## 2018-03-09 LAB — TROPONIN I
Troponin I: 0.03 ng/mL (ref <0.03)
Troponin I: 0.04 ng/mL (ref <0.03)
Troponin I: 0.04 ng/mL (ref <0.03)

## 2018-03-09 LAB — TSH: TSH: 2.883 u[IU]/mL (ref 0.350–4.500)

## 2018-03-09 MED ORDER — DOCUSATE SODIUM 100 MG PO CAPS
100.0000 mg | ORAL_CAPSULE | Freq: Two times a day (BID) | ORAL | Status: DC
  Administered 2018-03-09 – 2018-03-12 (×2): 100 mg via ORAL
  Filled 2018-03-09 (×5): qty 1

## 2018-03-09 MED ORDER — ISOSORBIDE DINITRATE 10 MG PO TABS
10.0000 mg | ORAL_TABLET | Freq: Two times a day (BID) | ORAL | Status: DC
  Administered 2018-03-09 – 2018-03-11 (×6): 10 mg via ORAL
  Filled 2018-03-09 (×8): qty 1

## 2018-03-09 MED ORDER — ONDANSETRON HCL 4 MG PO TABS
4.0000 mg | ORAL_TABLET | Freq: Four times a day (QID) | ORAL | Status: DC | PRN
Start: 2018-03-09 — End: 2018-03-12

## 2018-03-09 MED ORDER — ACETAMINOPHEN 650 MG RE SUPP: 650.0000 mg | Freq: Four times a day (QID) | RECTAL | Status: DC | PRN

## 2018-03-09 MED ORDER — HEPARIN (PORCINE) IN NACL 100-0.45 UNIT/ML-% IJ SOLN
1300.0000 [IU]/h | INTRAMUSCULAR | Status: DC
  Administered 2018-03-09 – 2018-03-11 (×2): 1300 [IU]/h via INTRAVENOUS
  Filled 2018-03-09 (×3): qty 250

## 2018-03-09 MED ORDER — ASPIRIN 81 MG PO CHEW
324.0000 mg | CHEWABLE_TABLET | Freq: Once | ORAL | Status: AC
  Administered 2018-03-09: 324 mg via ORAL
  Filled 2018-03-09: qty 4

## 2018-03-09 MED ORDER — HEPARIN BOLUS VIA INFUSION
4000.0000 [IU] | Freq: Once | INTRAVENOUS | Status: AC
  Administered 2018-03-09: 4000 [IU] via INTRAVENOUS
  Filled 2018-03-09: qty 4000

## 2018-03-09 MED ORDER — SIMVASTATIN 20 MG PO TABS
40.0000 mg | ORAL_TABLET | Freq: Every day | ORAL | Status: DC
  Administered 2018-03-09 – 2018-03-12 (×4): 40 mg via ORAL
  Filled 2018-03-09 (×4): qty 2

## 2018-03-09 MED ORDER — ASPIRIN EC 81 MG PO TBEC
81.0000 mg | DELAYED_RELEASE_TABLET | Freq: Every day | ORAL | Status: DC
  Administered 2018-03-09 – 2018-03-12 (×4): 81 mg via ORAL
  Filled 2018-03-09 (×4): qty 1

## 2018-03-09 MED ORDER — CLOPIDOGREL BISULFATE 75 MG PO TABS
75.0000 mg | ORAL_TABLET | Freq: Every day | ORAL | Status: DC
  Administered 2018-03-09 – 2018-03-12 (×4): 75 mg via ORAL
  Filled 2018-03-09 (×4): qty 1

## 2018-03-09 MED ORDER — TAMSULOSIN HCL 0.4 MG PO CAPS
0.4000 mg | ORAL_CAPSULE | Freq: Every day | ORAL | Status: DC
  Administered 2018-03-09 – 2018-03-11 (×3): 0.4 mg via ORAL
  Filled 2018-03-09 (×3): qty 1

## 2018-03-09 MED ORDER — ENOXAPARIN SODIUM 40 MG/0.4ML ~~LOC~~ SOLN: 40.0000 mg | SUBCUTANEOUS | Status: DC

## 2018-03-09 MED ORDER — ACETAMINOPHEN 325 MG PO TABS: 650.0000 mg | ORAL_TABLET | Freq: Four times a day (QID) | ORAL | Status: DC | PRN

## 2018-03-09 MED ORDER — ONDANSETRON HCL 4 MG/2ML IJ SOLN: 4.0000 mg | Freq: Four times a day (QID) | INTRAMUSCULAR | Status: DC | PRN

## 2018-03-09 MED ORDER — LATANOPROST 0.005 % OP SOLN
1.0000 [drp] | Freq: Every day | OPHTHALMIC | Status: DC
Start: 2018-03-09 — End: 2018-03-12
  Administered 2018-03-09 – 2018-03-11 (×3): 1 [drp] via OPHTHALMIC
  Filled 2018-03-09: qty 2.5

## 2018-03-09 NOTE — Consult Note (Signed)
CARDIOLOGY CONSULT NOTE       Patient ID: COURTNEY FENLON MRN: [id number] DOB/AGE: 1929/02/10 84 y.o.  Admit date: 03/09/2018 Referring Physician: Verdell Carmine Primary Physician: Virginia Crews, MD Primary Cardiologist: Rockey Situ Reason for Consultation: Chest Pain  Active Problems:   Chest pain   HPI:  84 y.o. german male admitted this am with SSCP. He recently established with Dr Rockey Situ in June after moving from [place] to be closer to his children. He is not very active due to gain instability. CABG 22 years ago. He thinks 5 years after cath he had a problem They did cath but didn't put stent in. Has not had non invasive testing since. Yesterday was walking and noted SSCP left chest radiating to shoulder. Did not have nitro at home. Pain similar with some indigestion to prior angina. Pain resolved and has not recurred. Troponin .Marland Kitchen03 ECG RBBB old IMI no acute changes CXR NAD  Compliant with his medications   ROS All other systems reviewed and negative except as noted above  Past Medical History:  Diagnosis Date  . History of heart attack   . Hyperlipidemia   . Prostate cancer ([place])   . Sepsis due to urinary tract infection ([place])   . TIA (transient ischemic attack)     Family History  Problem Relation Age of Onset  . Stroke Mother   . Heart attack Father   . Prostate cancer Brother 51  . Heart attack Daughter   . Kidney cancer Neg Hx   . Bladder Cancer Neg Hx     Social History   Socioeconomic History  . Marital status: Married    Spouse name: Jerimah Witucki  . Number of children: 3  . Years of education: Not on file  . Highest education level: Not on file  Occupational History  . Occupation: Retired    Comment: Market researcher  Social Needs  . Financial resource strain: Not hard at all  . Food insecurity:    Worry: Never true    Inability: Never true  . Transportation needs:    Medical: No    Non-medical: No  Tobacco Use  . Smoking status: Former Smoker   Packs/day: 1.00    Years: 10.00    Pack years: 10.00    Types: Cigarettes    Last attempt to quit: 09/10/1964    Years since quitting: 53.5  . Smokeless tobacco: Never Used  Substance and Sexual Activity  . Alcohol use: Never    Frequency: Never  . Drug use: Never  . Sexual activity: Not on file  Lifestyle  . Physical activity:    Days per week: 0 days    Minutes per session: 0 min  . Stress: Not on file  Relationships  . Social connections:    Talks on phone: Not on file    Gets together: Not on file    Attends religious service: Not on file    Active member of club or organization: Not on file    Attends meetings of clubs or organizations: Not on file    Relationship status: Not on file  . Intimate partner violence:    Fear of current or ex partner: Not on file    Emotionally abused: Not on file    Physically abused: Not on file    Forced sexual activity: Not on file  Other Topics Concern  . Not on file  Social History Narrative  . Not on file    Past Surgical History:  Procedure Laterality Date  . CORONARY ARTERY BYPASS GRAFT       . aspirin EC  81 mg Oral Daily  . clopidogrel  75 mg Oral Daily  . docusate sodium  100 mg Oral BID  . enoxaparin (LOVENOX) injection  40 mg Subcutaneous Q24H  . latanoprost  1 drop Both Eyes QHS  . simvastatin  40 mg Oral Daily  . tamsulosin  0.4 mg Oral QPC supper     Physical Exam: Blood pressure (!) 174/73, pulse (!) 54, temperature 97.6 F (36.4 C), temperature source Oral, resp. rate 18, height 6\' 2"  (1.88 m), weight 245 lb (111.1 kg), SpO2 98 %.   Affect appropriate Healthy:  appears stated age 41: normal Neck supple with no adenopathy JVP normal no bruits no thyromegaly Lungs clear with no wheezing and good diaphragmatic motion Heart:  S1/S2 no murmur, no rub, gallop or click PMI normal Abdomen: benighn, BS positve, no tenderness, no AAA no bruit.  No HSM or HJR Distal pulses intact with no bruits No  edema Neuro non-focal Skin warm and dry No muscular weakness   Labs:   Lab Results  Component Value Date   WBC 10.7 (H) 03/09/2018   HGB 13.5 03/09/2018   HCT 40.4 03/09/2018   MCV 86.6 03/09/2018   PLT 157 03/09/2018    Recent Labs  Lab 03/09/18 0203  NA 140  K 4.0  CL 106  CO2 27  BUN 20  CREATININE 1.05  CALCIUM 8.7*  GLUCOSE 115*   Lab Results  Component Value Date   TROPONINI 0.03 (HH) 03/09/2018    Lab Results  Component Value Date   CHOL 131 03/09/2018   Lab Results  Component Value Date   HDL 41 03/09/2018   Lab Results  Component Value Date   LDLCALC 78 03/09/2018   Lab Results  Component Value Date   TRIG 62 03/09/2018   Lab Results  Component Value Date   CHOLHDL 3.2 03/09/2018   No results found for: LDLDIRECT    Radiology: Dg Chest Port 1 View  Result Date: 03/09/2018 CLINICAL DATA:  84 year old male with chest pain. EXAM: PORTABLE CHEST 1 VIEW COMPARISON:  Chest radiograph dated 12/26/2017 FINDINGS: Emphysematous changes of the lungs with bibasilar atelectasis/scarring. No focal consolidation, pleural effusion, or pneumothorax. Stable cardiac silhouette. Median sternotomy wires and CABG vascular clips. No acute osseous pathology. IMPRESSION: No active disease. Electronically Signed   By: Anner Crete M.D.   On: 03/09/2018 02:35    EKG: SR RBBB old IMI no acute changes   ASSESSMENT AND PLAN:   Chest Pain:  Concern in elderly male with 84 yo bypass grafts. Clearly his vein grafts would not all be patent at this point. Discussed options with patient. Favor starting heparin given thrombus burden that can accumulate in old grafts and do diagnostic cath on Monday. Despite his age he is very functional and would benefit from definitive diagnostic test to see where he stands HR is less than 60 continue DAT add nitrates  HLD:  Continue statin   Prostatism:  Urine stream ok continue Flomax   Signed: Jenkins Rouge 03/09/2018, 8:41 AM

## 2018-03-09 NOTE — ED Notes (Signed)
Patient updated on laboratory results and current plan of care per MD [facility] request. Patient verbalized understanding of information discussed.

## 2018-03-09 NOTE — ED Notes (Signed)
ED Provider at bedside. 

## 2018-03-09 NOTE — ED Notes (Signed)
Patient c/o left chest pain described as nagging beginning at midnight and lasting for approx 1 1/2 - 2 hours. Patient denies radiation and accompanying symptoms. Patient report hx of cardiac stents placed over 20 years ago.

## 2018-03-09 NOTE — Care Management Obs Status (Signed)
[facility] NOTIFICATION   Patient Details  Name: Matthew Bryan MRN: [id number] Date of Birth: [birth year]   Medicare Observation Status Notification Given:  Yes    Nayellie Sanseverino A Cj Edgell, RN 03/09/2018, 1:30 PM

## 2018-03-09 NOTE — ED Notes (Signed)
Patient resting. Water brought to patient's spouse.

## 2018-03-09 NOTE — ED Triage Notes (Signed)
Pt arrives to ED via POV with c/o non-radiating left-sided chest pain x1 hr PTA. Pt denies SHOB, no N/V, no lightheadedness or dizziness, no diaphoresis. Pt reports triple bypass in 1997. Pt is A&O, in NAD; RR even, regular, and unlabored; skin in WPD.

## 2018-03-09 NOTE — ED Provider Notes (Signed)
[facility] Emergency Department Provider Note   First MD Initiated Contact with Patient 03/09/18 [phone]     (approximate)  I have reviewed the triage vital signs and the nursing notes.   HISTORY  Chief Complaint Chest Pain    HPI Matthew Bryan is a 84 y.o. male with below list of chronic medical conditions including previous myocardial infarction status post triple-vessel coronary artery bypass graft in 1997 presents to the emergency department with acute onset of nonradiating left-sided chest pain that was 8 out of 10 now currently no pain.  Patient states that the pain lasted approximately 1 to 2 hours and was persistent during the period of time.  She denies any dyspnea no lower externally pain or swelling.  Past Medical History:  Diagnosis Date  . History of heart attack   . Hyperlipidemia   . Prostate cancer ([place])   . Sepsis due to urinary tract infection ([place])   . TIA (transient ischemic attack)     Patient Active Problem List   Diagnosis Date Noted  . Unsteady gait 02/12/2018  . Falls, sequela 02/12/2018  . Prostate cancer ([place]) 12/19/2017  . TIA (transient ischemic attack) 12/19/2017  . Hx of CABG 12/19/2017  . Hyperlipidemia 12/19/2017    Past Surgical History:  Procedure Laterality Date  . CORONARY ARTERY BYPASS GRAFT      Prior to Admission medications   Medication Sig Start Date End Date Taking? Authorizing Provider  aspirin EC 81 MG tablet Take 81 mg by mouth daily.    [provider]  clopidogrel (PLAVIX) 75 MG tablet Take 1 tablet (75 mg total) by mouth daily. 12/19/17   Virginia Crews, MD  nystatin-triamcinolone ointment [facility]) Apply 1 application topically 2 (two) times daily. 01/23/18   Hollice Espy, MD  simvastatin (ZOCOR) 40 MG tablet Take 1 tablet (40 mg total) by mouth daily. 02/12/18   Minna Merritts, MD  tamsulosin (FLOMAX) 0.4 MG CAPS capsule Take 1 capsule (0.4 mg total) by mouth. 02/12/18   Minna Merritts, MD  TRAVATAN Z 0.004 % SOLN ophthalmic solution INT 1 GTT INTO OU HS 12/27/17   [provider]    Allergies No known drug allergies  Family History  Problem Relation Age of Onset  . Stroke Mother   . Heart attack Father   . Prostate cancer Brother 88  . Heart attack Daughter   . Kidney cancer Neg Hx   . Bladder Cancer Neg Hx     Social History Social History   Tobacco Use  . Smoking status: Former Smoker    Packs/day: 1.00    Years: 10.00    Pack years: 10.00    Types: Cigarettes    Last attempt to quit: 09/10/1964    Years since quitting: 53.5  . Smokeless tobacco: Never Used  Substance Use Topics  . Alcohol use: Never    Frequency: Never  . Drug use: Never    Review of Systems Constitutional: No fever/chills Eyes: No visual changes. ENT: No sore throat. Cardiovascular: Positive for chest pain. Respiratory: Denies shortness of breath. Gastrointestinal: No abdominal pain.  No nausea, no vomiting.  No diarrhea.  No constipation. Genitourinary: Negative for dysuria. Musculoskeletal: Negative for neck pain.  Negative for back pain. Integumentary: Negative for rash. Neurological: Negative for headaches, focal weakness or numbness.  ____________________________________________   PHYSICAL EXAM:  VITAL SIGNS: ED Triage Vitals  Enc Vitals Group     BP 03/09/18 0132 (!) 178/71  Pulse Rate 03/09/18 0132 61     Resp 03/09/18 0132 17     Temp 03/09/18 0132 (!) 97.4 F (36.3 C)     Temp Source 03/09/18 0132 Oral     SpO2 03/09/18 0132 98 %     Weight 03/09/18 0133 108.9 kg (240 lb)     Height 03/09/18 0133 1.88 m (6\' 2" )     Head Circumference --      Peak Flow --      Pain Score 03/09/18 0132 4     Pain Loc --      Pain Edu? --      Excl. in [place]? --     Constitutional: Alert and oriented. Well appearing and in no acute distress. Eyes: Conjunctivae are normal.  Head: Atraumatic. Mouth/Throat: Mucous membranes are moist.  Oropharynx  non-erythematous. Neck: No stridor.   Cardiovascular: Normal rate, regular rhythm. Good peripheral circulation. Grossly normal heart sounds. Respiratory: Normal respiratory effort.  No retractions. Lungs CTAB. Gastrointestinal: Soft and nontender. No distention.  Musculoskeletal: No lower extremity tenderness nor edema. No gross deformities of extremities. Neurologic:  Normal speech and language. No gross focal neurologic deficits are appreciated.  Skin:  Skin is warm, dry and intact. No rash noted. Psychiatric: Mood and affect are normal. Speech and behavior are normal.  ____________________________________________   LABS (all labs ordered are listed, but only abnormal results are displayed)  Labs Reviewed  BASIC METABOLIC PANEL - Abnormal; Notable for the following components:      Result Value   Glucose, Bld 115 (*)    Calcium 8.7 (*)    All other components within normal limits  CBC - Abnormal; Notable for the following components:   WBC 10.7 (*)    RDW 15.3 (*)    All other components within normal limits  TROPONIN I   ____________________________________________  EKG  ED ECG REPORT I, [place] N Marquan Vokes, the attending physician, personally viewed and interpreted this ECG.   Date: 03/09/2018  EKG Time: 1:32 AM  Rate: 64  Rhythm: Normal sinus rhythm with inferior Q waves  Axis: Normal  Intervals: Normal  ST&T Change: None  ED ECG REPORT I, [place] N Vaughn Beaumier, the attending physician, personally viewed and interpreted this ECG.   Date: 03/09/2018  EKG Time:1:47 AM  Rate: 59  Rhythm:Normal Sinus Rhythm  Axis: Normal  Intervals: Normal  ST&T Change: None  ____________________________________________  RADIOLOGY I, [place] N Theadore Blunck, personally viewed and evaluated these images (plain radiographs) as part of my medical decision making, as well as reviewing the written report by the radiologist.  ED MD interpretation: No active disease per radiologist on chest  x-ray.  Official radiology report(s): Dg Chest Port 1 View  Result Date: 03/09/2018 CLINICAL DATA:  84 year old male with chest pain. EXAM: PORTABLE CHEST 1 VIEW COMPARISON:  Chest radiograph dated 12/26/2017 FINDINGS: Emphysematous changes of the lungs with bibasilar atelectasis/scarring. No focal consolidation, pleural effusion, or pneumothorax. Stable cardiac silhouette. Median sternotomy wires and CABG vascular clips. No acute osseous pathology. IMPRESSION: No active disease. Electronically Signed   By: Anner Crete M.D.   On: 03/09/2018 02:35    ____________________________________________     Procedures   ____________________________________________   INITIAL IMPRESSION / ASSESSMENT AND PLAN / ED COURSE  As part of my medical decision making, I reviewed the following data within the electronic MEDICAL RECORD NUMBER   84 year old male presented with above-stated history and physical exam secondary to chest pain which resolved before my evaluation.  However given characteristics of chest pain and past medical history concern for possible ACS.  Initial EKG revealed no acute findings consistent with ischemia or infarction.  Repeat EKG likewise.  Laboratory data normal thus far with a troponin less than 0.03.  Spoke with Dr. Marcille Blanco regarding hospital admission for serial troponins and cardiology evaluation.  Patient is followed by Dr. Rockey Situ cardiologist. ____________________________________________  FINAL CLINICAL IMPRESSION(S) / ED DIAGNOSES  Final diagnoses:  Chest pain, unspecified type     MEDICATIONS GIVEN DURING THIS VISIT:  Medications  aspirin chewable tablet 324 mg (has no administration in time range)     ED Discharge Orders    None       Note:  This document was prepared using Dragon voice recognition software and may include unintentional dictation errors.    Gregor Hams, MD 03/09/18 [phone]

## 2018-03-09 NOTE — Consult Note (Signed)
ANTICOAGULATION CONSULT NOTE - Initial Consult  Pharmacy Consult for heparin drip Indication: chest pain/ACS  No Known Allergies  Patient Measurements: Height: 6\' 2"  (188 cm) Weight: 245 lb (111.1 kg) IBW/kg (Calculated) : 82.2 Heparin Dosing Weight: 105.3kg  Vital Signs: Temp: 97.6 F (36.4 C) (06/29 0738) Temp Source: Oral (06/29 0738) BP: 174/73 (06/29 0738) Pulse Rate: 54 (06/29 0738)  Labs: Recent Labs    03/09/18 0203 03/09/18 0511  HGB 13.5  --   HCT 40.4  --   PLT 157  --   CREATININE 1.05  --   TROPONINI <0.03 0.03*    Estimated Creatinine Clearance: 65.8 mL/min (by C-G formula based on SCr of 1.05 mg/dL).   Medical History: Past Medical History:  Diagnosis Date  . History of heart attack   . Hyperlipidemia   . Prostate cancer ([place])   . Sepsis due to urinary tract infection ([place])   . TIA (transient ischemic attack)     Medications:  Scheduled:  . aspirin EC  81 mg Oral Daily  . clopidogrel  75 mg Oral Daily  . docusate sodium  100 mg Oral BID  . heparin  4,000 Units Intravenous Once  . isosorbide dinitrate  10 mg Oral BID  . latanoprost  1 drop Both Eyes QHS  . simvastatin  40 mg Oral Daily  . tamsulosin  0.4 mg Oral QPC supper    Assessment: Patient is a 84 year old male with PMH of CAGB about 22 years ago. Presents with chest pain, slightly elevated troponin. Pharmacy consulted to dose heparin drip. Plan is for cath on Monday. Baseline labs ordered. CBC WNL. No PTA anticoagulation seen.  Goal of Therapy:  Heparin level 0.3-0.7 units/ml Monitor platelets by anticoagulation protocol: Yes   Plan:  Give 4000 units bolus x 1 Start heparin infusion at 1300 units/hr Check anti-Xa level in 8 hours and daily while on heparin Continue to monitor H&H and platelets   Keyli Duross D Ketra Duchesne, Pharm.D, BCPS Clinical Pharmacist  03/09/2018,10:08 AM

## 2018-03-09 NOTE — Consult Note (Signed)
ANTICOAGULATION CONSULT NOTE - Initial Consult  Pharmacy Consult for heparin drip Indication: chest pain/ACS  No Known Allergies  Patient Measurements: Height: 6\' 2"  (188 cm) Weight: 245 lb (111.1 kg) IBW/kg (Calculated) : 82.2 Heparin Dosing Weight: 105.3kg  Vital Signs: Temp: 97.7 F (36.5 C) (06/29 1924) Temp Source: Oral (06/29 1924) BP: 145/57 (06/29 1924) Pulse Rate: 61 (06/29 1924)  Labs: Recent Labs    03/09/18 0203 03/09/18 0511 03/09/18 1022 03/09/18 1703 03/09/18 1918  HGB 13.5  --   --   --   --   HCT 40.4  --   --   --   --   PLT 157  --   --   --   --   APTT  --   --  29  --   --   LABPROT  --   --  13.4  --   --   INR  --   --  1.03  --   --   HEPARINUNFRC  --   --   --   --  0.38  CREATININE 1.05  --   --   --   --   TROPONINI <0.03 0.03* 0.04* 0.04*  --     Estimated Creatinine Clearance: 65.8 mL/min (by C-G formula based on SCr of 1.05 mg/dL).   Medical History: Past Medical History:  Diagnosis Date  . History of heart attack   . Hyperlipidemia   . Prostate cancer ([place])   . Sepsis due to urinary tract infection ([place])   . TIA (transient ischemic attack)     Medications:  Scheduled:  . aspirin EC  81 mg Oral Daily  . clopidogrel  75 mg Oral Daily  . docusate sodium  100 mg Oral BID  . isosorbide dinitrate  10 mg Oral BID  . latanoprost  1 drop Both Eyes QHS  . simvastatin  40 mg Oral Daily  . tamsulosin  0.4 mg Oral QPC supper    Assessment: Patient is a 84 year old male with PMH of CAGB about 22 years ago. Presents with chest pain, slightly elevated troponin. Pharmacy consulted to dose heparin drip. Plan is for cath on Monday. Baseline labs ordered. CBC WNL. No PTA anticoagulation seen.  Goal of Therapy:  Heparin level 0.3-0.7 units/ml Monitor platelets by anticoagulation protocol: Yes   Plan:  6/29 1918 HL 0.38- Level is therapeutic x 1.Will continue current infusion rate of 1300u/hr and Will order confirmatory level in 8 hours.  CBC with AM labs per protocol.   Pernell Dupre, PharmD, BCPS Clinical Pharmacist 03/09/2018 7:52 PM

## 2018-03-09 NOTE — Progress Notes (Signed)
[place] [facility] [place] NAME: Matthew Bryan    MR#:  [id number]  DATE OF BIRTH:  1929-04-06  SUBJECTIVE:   Patient presents to the hospital secondary to chest pain.  Currently chest pain-free and hemodynamically stable.  Patient has a history of coronary artery bypass graft surgery 20 years ago.  Seen by cardiology and plan for cardiac catheterization on Monday.  REVIEW OF SYSTEMS:    Review of Systems  Constitutional: Negative for chills and fever.  HENT: Negative for congestion and tinnitus.   Eyes: Negative for blurred vision and double vision.  Respiratory: Negative for cough, shortness of breath and wheezing.   Cardiovascular: Negative for chest pain, orthopnea and PND.  Gastrointestinal: Negative for abdominal pain, diarrhea, nausea and vomiting.  Genitourinary: Negative for dysuria and hematuria.  Neurological: Negative for dizziness, sensory change and focal weakness.  All other systems reviewed and are negative.   Nutrition: Heart Healthy Tolerating Diet: Yes Tolerating PT:  Ambulatory  DRUG ALLERGIES:  No Known Allergies  VITALS:  Blood pressure (!) 174/73, pulse (!) 54, temperature 97.6 F (36.4 C), temperature source Oral, resp. rate 18, height 6\' 2"  (1.88 m), weight 111.1 kg (245 lb), SpO2 98 %.  PHYSICAL EXAMINATION:   Physical Exam  GENERAL:  84 y.o.-year-old patient lying in bed in no acute distress.  EYES: Pupils equal, round, reactive to light and accommodation. No scleral icterus. Extraocular muscles intact.  HEENT: Head atraumatic, normocephalic. Oropharynx and nasopharynx clear.  NECK:  Supple, no jugular venous distention. No thyroid enlargement, no tenderness.  LUNGS: Normal breath sounds bilaterally, no wheezing, rales, rhonchi. No use of accessory muscles of respiration.  CARDIOVASCULAR: S1, S2 normal. No murmurs, rubs, or gallops.  ABDOMEN: Soft, nontender, nondistended. Bowel sounds present. No organomegaly or  mass.  EXTREMITIES: No cyanosis, clubbing or edema b/l.    NEUROLOGIC: Cranial nerves II through XII are intact. No focal Motor or sensory deficits b/l.   PSYCHIATRIC: The patient is alert and oriented x 3.  SKIN: No obvious rash, lesion, or ulcer.     LABORATORY PANEL:   CBC Recent Labs  Lab 03/09/18 0203  WBC 10.7*  HGB 13.5  HCT 40.4  PLT 157   ------------------------------------------------------------------------------------------------------------------  Chemistries  Recent Labs  Lab 03/09/18 0203  NA 140  K 4.0  CL 106  CO2 27  GLUCOSE 115*  BUN 20  CREATININE 1.05  CALCIUM 8.7*   ------------------------------------------------------------------------------------------------------------------  Cardiac Enzymes Recent Labs  Lab 03/09/18 1022  TROPONINI 0.04*   ------------------------------------------------------------------------------------------------------------------  RADIOLOGY:  Dg Chest Port 1 View  Result Date: 03/09/2018 CLINICAL DATA:  84 year old male with chest pain. EXAM: PORTABLE CHEST 1 VIEW COMPARISON:  Chest radiograph dated 12/26/2017 FINDINGS: Emphysematous changes of the lungs with bibasilar atelectasis/scarring. No focal consolidation, pleural effusion, or pneumothorax. Stable cardiac silhouette. Median sternotomy wires and CABG vascular clips. No acute osseous pathology. IMPRESSION: No active disease. Electronically Signed   By: Anner Crete M.D.   On: 03/09/2018 02:35     ASSESSMENT AND PLAN:   84 yo male previous history of hypertension, TIA, prostate cancer, coronary artery bypass graft surgery 20 years ago who presented to the hospital due to chest pain/pressure.  1.  Chest pain/pressure- patient symptoms a very typical of angina. - His cardiac markers are currently negative, EKG shows no acute ST or T wave changes.  Seen by cardiology and given his good story and risk factors plan for cardiac catheterization on Monday. -  Continue aspirin, Plavix, heparin drip, Isordil, simvastatin.  2.  BPH-no urinary retention.  Continue Flomax.  3.  Essential hypertension-continue Isordil  4. Hyperlipidemia - cont. Simvastatin   All the records are reviewed and case discussed with Care Management/Social Worker. Management plans discussed with the patient, family and they are in agreement.  CODE STATUS: Full code  DVT Prophylaxis: Hep. gtt  TOTAL TIME TAKING CARE OF THIS PATIENT: 30 minutes.   POSSIBLE D/C IN 2-3 DAYS, DEPENDING ON CLINICAL CONDITION.   Henreitta Leber M.D on 03/09/2018 at 1:25 PM  Between 7am to 6pm - Pager - [phone]  After 6pm go to [email or web address] - Proofreader  [facility] [place]  Office  [phone]  CC: Primary care physician; Virginia Crews, MD

## 2018-03-09 NOTE — Care Management Note (Addendum)
Case Management Note  Patient Details  Name: Matthew Bryan MRN: [id number] Date of Birth: 1928/09/14  Subjective/Objective:   Patient admitted to [facility] under observation status for chest pain. RNCM consulted on patient to provide MOON letter and complete assessment. Patient lives at home with his wife and has support from her and their daughter Matthew Bryan [phone]. Patient is completely independent at home and able to complete all activities of daily living. No DME used. Patient and spouse still drive. PCP is Matthew Bryan. Obtains medications without concern.                    Action/Plan:  RNCM to continue to follow for any needs.  Expected Discharge Date:  03/09/18               Expected Discharge Plan:     In-House Referral:     Discharge planning Services     Post Acute Care Choice:    Choice offered to:     DME Arranged:    DME Agency:     HH Arranged:    HH Agency:     Status of Service:     If discussed at [employer], dates discussed:    Additional Comments:  Matthew Bryan A Matthew Soley, RN 03/09/2018, 1:30 PM

## 2018-03-10 DIAGNOSIS — I200 Unstable angina: Secondary | ICD-10-CM | POA: Diagnosis not present

## 2018-03-10 DIAGNOSIS — R079 Chest pain, unspecified: Secondary | ICD-10-CM | POA: Diagnosis not present

## 2018-03-10 DIAGNOSIS — E785 Hyperlipidemia, unspecified: Secondary | ICD-10-CM | POA: Diagnosis not present

## 2018-03-10 DIAGNOSIS — I2510 Atherosclerotic heart disease of native coronary artery without angina pectoris: Secondary | ICD-10-CM | POA: Diagnosis not present

## 2018-03-10 LAB — CBC
HCT: 38.6 % — ABNORMAL LOW (ref 40.0–52.0)
Hemoglobin: 13 g/dL (ref 13.0–18.0)
MCH: 29 pg (ref 26.0–34.0)
MCHC: 33.7 g/dL (ref 32.0–36.0)
MCV: 86.3 fL (ref 80.0–100.0)
PLATELETS: 145 10*3/uL — AB (ref 150–440)
RBC: 4.47 MIL/uL (ref 4.40–5.90)
RDW: 14.7 % — ABNORMAL HIGH (ref 11.5–14.5)
WBC: 10.6 10*3/uL (ref 3.8–10.6)

## 2018-03-10 LAB — HEPARIN LEVEL (UNFRACTIONATED): Heparin Unfractionated: 0.4 IU/mL (ref 0.30–0.70)

## 2018-03-10 NOTE — Progress Notes (Signed)
ANTICOAGULATION CONSULT NOTE - Follow Up Consult  Pharmacy Consult for Heparin  Indication: chest pain/ACS  No Known Allergies  Patient Measurements: Height: 6\' 2"  (188 cm) Weight: 242 lb 4.8 oz (109.9 kg) IBW/kg (Calculated) : 82.2 Heparin Dosing Weight: 105.3 kg   Vital Signs: Temp: 97.8 F (36.6 C) (06/30 0428) Temp Source: Oral (06/30 0428) BP: 154/58 (06/30 0428) Pulse Rate: 54 (06/30 0428)  Labs: Recent Labs    03/09/18 0203 03/09/18 0511 03/09/18 1022 03/09/18 1703 03/09/18 1918 03/10/18 0424  HGB 13.5  --   --   --   --  13.0  HCT 40.4  --   --   --   --  38.6*  PLT 157  --   --   --   --  145*  APTT  --   --  29  --   --   --   LABPROT  --   --  13.4  --   --   --   INR  --   --  1.03  --   --   --   HEPARINUNFRC  --   --   --   --  0.38 0.40  CREATININE 1.05  --   --   --   --   --   TROPONINI <0.03 0.03* 0.04* 0.04*  --   --     Estimated Creatinine Clearance: 65.4 mL/min (by C-G formula based on SCr of 1.05 mg/dL).   Medications:  Medications Prior to Admission  Medication Sig Dispense Refill Last Dose  . aspirin EC 81 MG tablet Take 81 mg by mouth daily.   03/08/2018 at Unknown time  . clopidogrel (PLAVIX) 75 MG tablet Take 1 tablet (75 mg total) by mouth daily. 90 tablet 3 03/08/2018 at Unknown time  . nystatin-triamcinolone ointment (MYCOLOG) Apply 1 application topically 2 (two) times daily. (Patient taking differently: Apply 1 application topically 2 (two) times daily as needed (rash). ) 30 g 1 prn at prn  . simvastatin (ZOCOR) 40 MG tablet Take 1 tablet (40 mg total) by mouth daily. 90 tablet 3 03/08/2018 at Unknown time  . tamsulosin (FLOMAX) 0.4 MG CAPS capsule Take 0.4 mg by mouth daily after supper.  30 capsule  03/08/2018 at Unknown time  . TRAVATAN Z 0.004 % SOLN ophthalmic solution INT 1 GTT INTO OU HS  4 Not Taking at Unknown time    Assessment: Patient is a 84 year old male with PMH of CAGB about 22 years ago. Presents with chest pain,  slightly elevated troponin. Pharmacy consulted to dose heparin drip. Plan is for cath on Monday. Baseline labs ordered. CBC WNL. No PTA anticoagulation seen.   Goal of Therapy:  Heparin level 0.3-0.7 units/ml Monitor platelets by anticoagulation protocol: Yes   Plan:  6/30: HL @ 0424 = 0.4 Will continue this pt on current rate and recheck HL on 7/1 with AM labs.   Talajah Slimp D 03/10/2018,5:23 AM

## 2018-03-10 NOTE — H&P (View-Only) (Signed)
Subjective:  Denies SSCP, palpitations or Dyspnea   Objective:  Vitals:   03/09/18 0738 03/09/18 1620 03/09/18 1924 03/10/18 0428  BP: (!) 174/73 (!) 153/74 (!) 145/57 (!) 154/58  Pulse: (!) 54 (!) 52 61 (!) 54  Resp: 18 20 18 16   Temp: 97.6 F (36.4 C)  97.7 F (36.5 C) 97.8 F (36.6 C)  TempSrc: Oral  Oral Oral  SpO2: 98% 97% 96% 97%  Weight:    242 lb 4.8 oz (109.9 kg)  Height:        Intake/Output from previous day:  Intake/Output Summary (Last 24 hours) at 03/10/2018 9233 Last data filed at 03/10/2018 0076 Gross per 24 hour  Intake 251.77 ml  Output 2450 ml  Net -2198.23 ml    Physical Exam: Affect appropriate Healthy:  appears stated age HEENT: normal Neck supple with no adenopathy JVP normal no bruits no thyromegaly Lungs clear with no wheezing and good diaphragmatic motion Heart:  S1/S2 SEM murmur, no rub, gallop or click PMI normal  Post sternotomy  Abdomen: benighn, BS positve, no tenderness, no AAA no bruit.  No HSM or HJR Distal pulses intact with no bruits No edema Neuro non-focal Skin warm and dry No muscular weakness   Lab Results: Basic Metabolic Panel: Recent Labs    03/09/18 0203  NA 140  K 4.0  CL 106  CO2 27  GLUCOSE 115*  BUN 20  CREATININE 1.05  CALCIUM 8.7*   CBC: Recent Labs    03/09/18 0203 03/10/18 0424  WBC 10.7* 10.6  HGB 13.5 13.0  HCT 40.4 38.6*  MCV 86.6 86.3  PLT 157 145*   Cardiac Enzymes: Recent Labs    03/09/18 0511 03/09/18 1022 03/09/18 1703  TROPONINI 0.03* 0.04* 0.04*   BNP: Invalid input(s): POCBNP D-Dimer: No results for input(s): DDIMER in the last 72 hours. Hemoglobin A1C: Recent Labs    03/09/18 0511  HGBA1C 6.0*   Fasting Lipid Panel: Recent Labs    03/09/18 0511  CHOL 131  HDL 41  LDLCALC 78  TRIG 62  CHOLHDL 3.2   Thyroid Function Tests: Recent Labs    03/09/18 0511  TSH 2.883   Anemia Panel: No results for input(s): VITAMINB12, FOLATE, FERRITIN, TIBC, IRON,  RETICCTPCT in the last 72 hours.  Imaging: Dg Chest Port 1 View  Result Date: 03/09/2018 CLINICAL DATA:  84 year old male with chest pain. EXAM: PORTABLE CHEST 1 VIEW COMPARISON:  Chest radiograph dated 12/26/2017 FINDINGS: Emphysematous changes of the lungs with bibasilar atelectasis/scarring. No focal consolidation, pleural effusion, or pneumothorax. Stable cardiac silhouette. Median sternotomy wires and CABG vascular clips. No acute osseous pathology. IMPRESSION: No active disease. Electronically Signed   By: Anner Crete M.D.   On: 03/09/2018 02:35    Cardiac Studies:  ECG: SR old ILMI RBBB    Telemetry:  NSR PaCls   Echo:   Medications:   . aspirin EC  81 mg Oral Daily  . clopidogrel  75 mg Oral Daily  . docusate sodium  100 mg Oral BID  . isosorbide dinitrate  10 mg Oral BID  . latanoprost  1 drop Both Eyes QHS  . simvastatin  40 mg Oral Daily  . tamsulosin  0.4 mg Oral QPC supper     . heparin 1,300 Units/hr (03/09/18 1050)    Assessment/Plan:   Chest Pain:  R/O no acute ECG changes Pain free on heparin 84 yo bypass grafts. Clearly his vein grafts would not all be patent at  this point. Discussed options with patient. Favor starting heparin given thrombus burden that can accumulate in old grafts and do diagnostic cath on Monday. Despite his age he is very functional and would benefit from definitive diagnostic test to see where he stands HR is less than 60 continue DAT and nitrates  HLD:  Continue station   Prostatism Continue Flomax    Jenkins Rouge 03/10/2018, 9:52 AM

## 2018-03-10 NOTE — Progress Notes (Signed)
[place] [facility] [place] NAME: Matthew Bryan    MR#:  [id number]  DATE OF BIRTH:  05-15-1929  SUBJECTIVE:   Patient presented to the hospital secondary to chest pain.  Seen by cardiology and plan for cardiac catheterization tomorrow.  Patient is currently chest pain-free and stable.  REVIEW OF SYSTEMS:    Review of Systems  Constitutional: Negative for chills and fever.  HENT: Negative for congestion and tinnitus.   Eyes: Negative for blurred vision and double vision.  Respiratory: Negative for cough, shortness of breath and wheezing.   Cardiovascular: Negative for chest pain, orthopnea and PND.  Gastrointestinal: Negative for abdominal pain, diarrhea, nausea and vomiting.  Genitourinary: Negative for dysuria and hematuria.  Neurological: Negative for dizziness, sensory change and focal weakness.  All other systems reviewed and are negative.   Nutrition: Heart Healthy Tolerating Diet: Yes Tolerating PT:  Ambulatory  DRUG ALLERGIES:  No Known Allergies  VITALS:  Blood pressure (!) 149/85, pulse 61, temperature 97.8 F (36.6 C), temperature source Oral, resp. rate 18, height 6\' 2"  (1.88 m), weight 109.9 kg (242 lb 4.8 oz), SpO2 98 %.  PHYSICAL EXAMINATION:   Physical Exam  GENERAL:  84 y.o.-year-old patient lying in bed in no acute distress.  EYES: Pupils equal, round, reactive to light and accommodation. No scleral icterus. Extraocular muscles intact.  HEENT: Head atraumatic, normocephalic. Oropharynx and nasopharynx clear.  NECK:  Supple, no jugular venous distention. No thyroid enlargement, no tenderness.  LUNGS: Normal breath sounds bilaterally, no wheezing, rales, rhonchi. No use of accessory muscles of respiration.  CARDIOVASCULAR: S1, S2 normal. No murmurs, rubs, or gallops.  ABDOMEN: Soft, nontender, nondistended. Bowel sounds present. No organomegaly or mass.  EXTREMITIES: No cyanosis, clubbing or edema b/l.    NEUROLOGIC:  Cranial nerves II through XII are intact. No focal Motor or sensory deficits b/l.   PSYCHIATRIC: The patient is alert and oriented x 3.  SKIN: No obvious rash, lesion, or ulcer.     LABORATORY PANEL:   CBC Recent Labs  Lab 03/10/18 0424  WBC 10.6  HGB 13.0  HCT 38.6*  PLT 145*   ------------------------------------------------------------------------------------------------------------------  Chemistries  Recent Labs  Lab 03/09/18 0203  NA 140  K 4.0  CL 106  CO2 27  GLUCOSE 115*  BUN 20  CREATININE 1.05  CALCIUM 8.7*   ------------------------------------------------------------------------------------------------------------------  Cardiac Enzymes Recent Labs  Lab 03/09/18 1703  TROPONINI 0.04*   ------------------------------------------------------------------------------------------------------------------  RADIOLOGY:  Dg Chest Port 1 View  Result Date: 03/09/2018 CLINICAL DATA:  84 year old male with chest pain. EXAM: PORTABLE CHEST 1 VIEW COMPARISON:  Chest radiograph dated 12/26/2017 FINDINGS: Emphysematous changes of the lungs with bibasilar atelectasis/scarring. No focal consolidation, pleural effusion, or pneumothorax. Stable cardiac silhouette. Median sternotomy wires and CABG vascular clips. No acute osseous pathology. IMPRESSION: No active disease. Electronically Signed   By: Anner Crete M.D.   On: 03/09/2018 02:35     ASSESSMENT AND PLAN:   84 yo male previous history of hypertension, TIA, prostate cancer, coronary artery bypass graft surgery 20 years ago who presented to the hospital due to chest pain/pressure.  1.  Chest pain/pressure- patient symptoms are very typical of angina. - His cardiac markers are currently negative, EKG shows no acute ST or T wave changes.  Seen by cardiology and given his good story and risk factors plan for cardiac catheterization on Monday. - Continue aspirin, Plavix, heparin drip, Isordil, simvastatin.  2.   BPH-no urinary retention.  Continue Flomax.  3.  Essential hypertension-continue Isordil  4. Hyperlipidemia - cont. Simvastatin   All the records are reviewed and case discussed with Care Management/Social Worker. Management plans discussed with the patient, family and they are in agreement.  CODE STATUS: Full code  DVT Prophylaxis: Hep. gtt  TOTAL TIME TAKING CARE OF THIS PATIENT: 25 minutes.   POSSIBLE D/C IN 1-2 DAYS, DEPENDING ON CLINICAL CONDITION.   Henreitta Leber M.D on 03/10/2018 at 1:14 PM  Between 7am to 6pm - Pager - [phone]  After 6pm go to [email or web address] - Proofreader  [facility]  Office  [phone]  CC: Primary care physician; Virginia Crews, MD

## 2018-03-10 NOTE — H&P (Signed)
Matthew Bryan is an 84 y.o. male.   Chief Complaint: Chest pain HPI: The patient with past medical history of coronary artery disease status post CABG, hyperlipidemia and cerebrovascular disease presents to the emergency department complaining of chest pain.  His pain began at rest and was directly over his left breast.  The patient denies shortness of breath, nausea, vomiting or diaphoresis.  His pain eased some with aspirin.  In the emergency department the patient's troponins were negative and he did not have EKG changes but due to his remote history of revascularization and risk factors for CAD the emergency department staff called the hospitalist service for admission.  Past Medical History:  Diagnosis Date  . History of heart attack   . Hyperlipidemia   . Prostate cancer ([place])   . Sepsis due to urinary tract infection ([place])   . TIA (transient ischemic attack)     Past Surgical History:  Procedure Laterality Date  . CORONARY ARTERY BYPASS GRAFT      Family History  Problem Relation Age of Onset  . Stroke Mother   . Heart attack Father   . Prostate cancer Brother 62  . Heart attack Daughter   . Kidney cancer Neg Hx   . Bladder Cancer Neg Hx    Social History:  reports that he quit smoking about 53 years ago. His smoking use included cigarettes. He has a 10.00 pack-year smoking history. He has never used smokeless tobacco. He reports that he does not drink alcohol or use drugs.  Allergies: No Known Allergies  Medications Prior to Admission  Medication Sig Dispense Refill  . aspirin EC 81 MG tablet Take 81 mg by mouth daily.    . clopidogrel (PLAVIX) 75 MG tablet Take 1 tablet (75 mg total) by mouth daily. 90 tablet 3  . nystatin-triamcinolone ointment (MYCOLOG) Apply 1 application topically 2 (two) times daily. (Patient taking differently: Apply 1 application topically 2 (two) times daily as needed (rash). ) 30 g 1  . simvastatin (ZOCOR) 40 MG tablet Take 1 tablet (40 mg total)  by mouth daily. 90 tablet 3  . tamsulosin (FLOMAX) 0.4 MG CAPS capsule Take 0.4 mg by mouth daily after supper.  30 capsule   . TRAVATAN Z 0.004 % SOLN ophthalmic solution INT 1 GTT INTO OU HS  4    Results for orders placed or performed during the hospital encounter of 03/09/18 (from the past 48 hour(s))  Basic metabolic panel     Status: Abnormal   Collection Time: 03/09/18  2:03 AM  Result Value Ref Range   Sodium 140 135 - 145 mmol/L   Potassium 4.0 3.5 - 5.1 mmol/L   Chloride 106 98 - 111 mmol/L    Comment: Please note change in reference range.   CO2 27 22 - 32 mmol/L   Glucose, Bld 115 (H) 70 - 99 mg/dL    Comment: Please note change in reference range.   BUN 20 8 - 23 mg/dL    Comment: Please note change in reference range.   Creatinine, Ser 1.05 0.61 - 1.24 mg/dL   Calcium 8.7 (L) 8.9 - 10.3 mg/dL   GFR calc non Af Amer >60 >60 mL/min   GFR calc Af Amer >60 >60 mL/min    Comment: (NOTE) The eGFR has been calculated using the CKD EPI equation. This calculation has not been validated in all clinical situations. eGFR's persistently <60 mL/min signify possible Chronic Kidney Disease.    Anion gap 7 5 -  15    Comment: Performed at [facility], [address] [place], [place] [address]  CBC     Status: Abnormal   Collection Time: 03/09/18  2:03 AM  Result Value Ref Range   WBC 10.7 (H) 3.8 - 10.6 K/uL   RBC 4.66 4.40 - 5.90 MIL/uL   Hemoglobin 13.5 13.0 - 18.0 g/dL   HCT 40.4 40.0 - 52.0 %   MCV 86.6 80.0 - 100.0 fL   MCH 28.9 26.0 - 34.0 pg   MCHC 33.3 32.0 - 36.0 g/dL   RDW 15.3 (H) 11.5 - 14.5 %   Platelets 157 150 - 440 K/uL    Comment: Performed at [facility], [address] [place], [place] [address]  Troponin I     Status: None   Collection Time: 03/09/18  2:03 AM  Result Value Ref Range   Troponin I <0.03 <0.03 ng/mL    Comment: Performed at [facility], [address] [place], [place] [address]  TSH     Status: None    Collection Time: 03/09/18  5:11 AM  Result Value Ref Range   TSH 2.883 0.350 - 4.500 uIU/mL    Comment: Performed by a 3rd Generation assay with a functional sensitivity of <=0.01 uIU/mL. Performed at [facility], [address] [place], [place] [address]   Hemoglobin A1c     Status: Abnormal   Collection Time: 03/09/18  5:11 AM  Result Value Ref Range   Hgb A1c MFr Bld 6.0 (H) 4.8 - 5.6 %    Comment: (NOTE) Pre diabetes:          5.7%-6.4% Diabetes:              >6.4% Glycemic control for   <7.0% adults with diabetes    Mean Plasma Glucose 125.5 mg/dL    Comment: Performed at [facility] [address], [place], [place] [address]  Troponin I     Status: Abnormal   Collection Time: 03/09/18  5:11 AM  Result Value Ref Range   Troponin I 0.03 (HH) <0.03 ng/mL    Comment: CRITICAL RESULT CALLED TO, READ BACK BY AND VERIFIED WITH Edmonia Caprio [facility] AT 3976 03/09/18.PMH Performed at [facility], [address] [place], [place] [address]   Lipid panel     Status: None   Collection Time: 03/09/18  5:11 AM  Result Value Ref Range   Cholesterol 131 0 - 200 mg/dL   Triglycerides 62 <150 mg/dL   HDL 41 >40 mg/dL   Total CHOL/HDL Ratio 3.2 RATIO   VLDL 12 0 - 40 mg/dL   LDL Cholesterol 78 0 - 99 mg/dL    Comment:        Total Cholesterol/HDL:CHD Risk Coronary Heart Disease Risk Table                     Men   Women  1/2 Average Risk   3.4   3.3  Average Risk       5.0   4.4  2 X Average Risk   9.6   7.1  3 X Average Risk  23.4   11.0        Use the calculated Patient Ratio above and the CHD Risk Table to determine the patient's CHD Risk.        ATP III CLASSIFICATION (LDL):  <100     mg/dL   Optimal  100-129  mg/dL   Near  or Above                    Optimal  130-159  mg/dL   Borderline  160-189  mg/dL   High  >190     mg/dL   Very High Performed at [facility], [address] [place], [place] [address]   Troponin I     Status:  Abnormal   Collection Time: 03/09/18 10:22 AM  Result Value Ref Range   Troponin I 0.04 (HH) <0.03 ng/mL    Comment: CRITICAL VALUE NOTED. VALUE IS CONSISTENT WITH PREVIOUSLY REPORTED/CALLED VALUE [facility] Performed at [facility], [address] [place], [place] [address]   Protime-INR     Status: None   Collection Time: 03/09/18 10:22 AM  Result Value Ref Range   Prothrombin Time 13.4 11.4 - 15.2 seconds   INR 1.03     Comment: Performed at [facility], [address] [place], [place] [address]  APTT     Status: None   Collection Time: 03/09/18 10:22 AM  Result Value Ref Range   aPTT 29 24 - 36 seconds    Comment: Performed at [facility], [address] [place], [place] [address]  Troponin I     Status: Abnormal   Collection Time: 03/09/18  5:03 PM  Result Value Ref Range   Troponin I 0.04 (HH) <0.03 ng/mL    Comment: CRITICAL VALUE NOTED. VALUE IS CONSISTENT WITH PREVIOUSLY REPORTED/CALLED VALUE BY CAF Performed at [facility], [place], [place] [address]   Heparin level (unfractionated)     Status: None   Collection Time: 03/09/18  7:18 PM  Result Value Ref Range   Heparin Unfractionated 0.38 0.30 - 0.70 IU/mL    Comment: (NOTE) If heparin results are below expected values, and patient dosage has  been confirmed, suggest follow up testing of antithrombin III levels. Performed at [facility], [address], [place], [place] [address]    Dg Chest [place] 1 View  Result Date: 03/09/2018 CLINICAL DATA:  84 year old male with chest pain. EXAM: PORTABLE CHEST 1 VIEW COMPARISON:  Chest radiograph dated 12/26/2017 FINDINGS: Emphysematous changes of the lungs with bibasilar atelectasis/scarring. No focal consolidation, pleural effusion, or pneumothorax. Stable cardiac silhouette. Median sternotomy wires and CABG vascular clips. No acute osseous pathology. IMPRESSION: No active disease. Electronically Signed   By:  Anner Crete M.D.   On: 03/09/2018 02:35    Review of Systems  Constitutional: Negative for chills and fever.  HENT: Negative for sore throat and tinnitus.   Eyes: Negative for blurred vision and redness.  Respiratory: Negative for cough and shortness of breath.   Cardiovascular: Positive for chest pain (Resolved). Negative for palpitations, orthopnea and PND.  Gastrointestinal: Negative for abdominal pain, diarrhea, nausea and vomiting.  Genitourinary: Negative for dysuria, frequency and urgency.  Musculoskeletal: Negative for joint pain and myalgias.  Skin: Negative for rash.       No lesions  Neurological: Negative for speech change, focal weakness and weakness.  Endo/Heme/Allergies: Does not bruise/bleed easily.       No temperature intolerance  Psychiatric/Behavioral: Negative for depression and suicidal ideas.    Blood pressure (!) 145/57, pulse 61, temperature 97.7 F (36.5 C), temperature source Oral, resp. rate 18, height 6' 2"  (1.88 m), weight 111.1 kg (245 lb), SpO2 96 %. Physical Exam  Vitals reviewed. Constitutional: He is oriented to person, place, and time. He appears well-developed and well-nourished. No  distress.  HENT:  Head: Normocephalic and atraumatic.  Mouth/Throat: Oropharynx is clear and moist.  Eyes: Pupils are equal, round, and reactive to light. Conjunctivae and EOM are normal. No scleral icterus.  Neck: Normal range of motion. Neck supple. No JVD present. No tracheal deviation present. No thyromegaly present.  Cardiovascular: Normal rate, regular rhythm and normal heart sounds. Exam reveals no gallop and no friction rub.  No murmur heard. Respiratory: Effort normal and breath sounds normal. No respiratory distress.  GI: Soft. Bowel sounds are normal. He exhibits no distension. There is no tenderness.  Genitourinary:  Genitourinary Comments: Deferred  Musculoskeletal: Normal range of motion. He exhibits no edema.  Lymphadenopathy:    He has no  cervical adenopathy.  Neurological: He is alert and oriented to person, place, and time. No cranial nerve deficit.  Skin: Skin is warm and dry. No rash noted. No erythema.  Psychiatric: He has a normal mood and affect. His behavior is normal. Judgment and thought content normal.     Assessment/Plan This is an 84 year old male admitted for chest pain. 1.  Chest pain: Resolved following aspirin: Continue to follow cardiac biomarkers.  Monitor telemetry.  Consult cardiology. 2.  CAD: Continue aspirin and Plavix 3.  Hyper lipidemia: Continue statin therapy 4.  BPH: Continue tamsulosin 5.  DVT prophylaxis: Lovenox 6.  GI prophylaxis: None The patient is a full code.  Time spent on admission orders and patient care approximately 45 minutes  Harrie Foreman, MD 03/10/2018, 2:13 AM

## 2018-03-10 NOTE — Progress Notes (Signed)
Subjective:  Denies SSCP, palpitations or Dyspnea   Objective:  Vitals:   03/09/18 0738 03/09/18 1620 03/09/18 1924 03/10/18 0428  BP: (!) 174/73 (!) 153/74 (!) 145/57 (!) 154/58  Pulse: (!) 54 (!) 52 61 (!) 54  Resp: 18 20 18 16   Temp: 97.6 F (36.4 C)  97.7 F (36.5 C) 97.8 F (36.6 C)  TempSrc: Oral  Oral Oral  SpO2: 98% 97% 96% 97%  Weight:    242 lb 4.8 oz (109.9 kg)  Height:        Intake/Output from previous day:  Intake/Output Summary (Last 24 hours) at 03/10/2018 1093 Last data filed at 03/10/2018 2355 Gross per 24 hour  Intake 251.77 ml  Output 2450 ml  Net -2198.23 ml    Physical Exam: Affect appropriate Healthy:  appears stated age HEENT: normal Neck supple with no adenopathy JVP normal no bruits no thyromegaly Lungs clear with no wheezing and good diaphragmatic motion Heart:  S1/S2 SEM murmur, no rub, gallop or click PMI normal  Post sternotomy  Abdomen: benighn, BS positve, no tenderness, no AAA no bruit.  No HSM or HJR Distal pulses intact with no bruits No edema Neuro non-focal Skin warm and dry No muscular weakness   Lab Results: Basic Metabolic Panel: Recent Labs    03/09/18 0203  NA 140  K 4.0  CL 106  CO2 27  GLUCOSE 115*  BUN 20  CREATININE 1.05  CALCIUM 8.7*   CBC: Recent Labs    03/09/18 0203 03/10/18 0424  WBC 10.7* 10.6  HGB 13.5 13.0  HCT 40.4 38.6*  MCV 86.6 86.3  PLT 157 145*   Cardiac Enzymes: Recent Labs    03/09/18 0511 03/09/18 1022 03/09/18 1703  TROPONINI 0.03* 0.04* 0.04*   BNP: Invalid input(s): POCBNP D-Dimer: No results for input(s): DDIMER in the last 72 hours. Hemoglobin A1C: Recent Labs    03/09/18 0511  HGBA1C 6.0*   Fasting Lipid Panel: Recent Labs    03/09/18 0511  CHOL 131  HDL 41  LDLCALC 78  TRIG 62  CHOLHDL 3.2   Thyroid Function Tests: Recent Labs    03/09/18 0511  TSH 2.883   Anemia Panel: No results for input(s): VITAMINB12, FOLATE, FERRITIN, TIBC, IRON,  RETICCTPCT in the last 72 hours.  Imaging: Dg Chest Port 1 View  Result Date: 03/09/2018 CLINICAL DATA:  84 year old male with chest pain. EXAM: PORTABLE CHEST 1 VIEW COMPARISON:  Chest radiograph dated 12/26/2017 FINDINGS: Emphysematous changes of the lungs with bibasilar atelectasis/scarring. No focal consolidation, pleural effusion, or pneumothorax. Stable cardiac silhouette. Median sternotomy wires and CABG vascular clips. No acute osseous pathology. IMPRESSION: No active disease. Electronically Signed   By: Anner Crete M.D.   On: 03/09/2018 02:35    Cardiac Studies:  ECG: SR old ILMI RBBB    Telemetry:  NSR PaCls   Echo:   Medications:   . aspirin EC  81 mg Oral Daily  . clopidogrel  75 mg Oral Daily  . docusate sodium  100 mg Oral BID  . isosorbide dinitrate  10 mg Oral BID  . latanoprost  1 drop Both Eyes QHS  . simvastatin  40 mg Oral Daily  . tamsulosin  0.4 mg Oral QPC supper     . heparin 1,300 Units/hr (03/09/18 1050)    Assessment/Plan:   Chest Pain:  R/O no acute ECG changes Pain free on heparin 84 yo bypass grafts. Clearly his vein grafts would not all be patent at  this point. Discussed options with patient. Favor starting heparin given thrombus burden that can accumulate in old grafts and do diagnostic cath on Monday. Despite his age he is very functional and would benefit from definitive diagnostic test to see where he stands HR is less than 60 continue DAT and nitrates  HLD:  Continue station   Prostatism Continue Flomax    Jenkins Rouge 03/10/2018, 9:52 AM

## 2018-03-11 ENCOUNTER — Encounter
Admission: EM | Disposition: A | Discharge status: Home / Self Care | Payer: Self-pay | Source: Home / Self Care | Attending: Emergency Medicine

## 2018-03-11 DIAGNOSIS — R079 Chest pain, unspecified: Secondary | ICD-10-CM | POA: Diagnosis not present

## 2018-03-11 DIAGNOSIS — I2510 Atherosclerotic heart disease of native coronary artery without angina pectoris: Secondary | ICD-10-CM | POA: Diagnosis not present

## 2018-03-11 DIAGNOSIS — E785 Hyperlipidemia, unspecified: Secondary | ICD-10-CM | POA: Diagnosis not present

## 2018-03-11 HISTORY — PX: LEFT HEART CATH AND CORS/GRAFTS ANGIOGRAPHY: CATH118250

## 2018-03-11 LAB — HEPARIN LEVEL (UNFRACTIONATED): Heparin Unfractionated: 0.36 IU/mL (ref 0.30–0.70)

## 2018-03-11 SURGERY — LEFT HEART CATH AND CORS/GRAFTS ANGIOGRAPHY
Anesthesia: Moderate Sedation

## 2018-03-11 MED ORDER — ASPIRIN 81 MG PO CHEW: 81.0000 mg | CHEWABLE_TABLET | ORAL | Status: DC

## 2018-03-11 MED ORDER — MIDAZOLAM HCL 2 MG/2ML IJ SOLN
INTRAMUSCULAR | Status: DC | PRN
  Administered 2018-03-11: 1 mg via INTRAVENOUS

## 2018-03-11 MED ORDER — IOPAMIDOL (ISOVUE-300) INJECTION 61%
INTRAVENOUS | Status: DC | PRN
  Administered 2018-03-11: 130 mL via INTRA_ARTERIAL

## 2018-03-11 MED ORDER — MIDAZOLAM HCL 2 MG/2ML IJ SOLN
INTRAMUSCULAR | Status: AC
  Filled 2018-03-11: qty 2

## 2018-03-11 MED ORDER — SODIUM CHLORIDE 0.9% FLUSH: 3.0000 mL | INTRAVENOUS | Status: DC | PRN

## 2018-03-11 MED ORDER — SODIUM CHLORIDE 0.9 % IV SOLN: 250.0000 mL | INTRAVENOUS | Status: DC | PRN

## 2018-03-11 MED ORDER — LIDOCAINE HCL (PF) 1 % IJ SOLN
INTRAMUSCULAR | Status: AC
  Filled 2018-03-11: qty 30

## 2018-03-11 MED ORDER — SODIUM CHLORIDE 0.9 % WEIGHT BASED INFUSION
3.0000 mL/kg/h | INTRAVENOUS | Status: DC
  Administered 2018-03-11: 3 mL/kg/h via INTRAVENOUS

## 2018-03-11 MED ORDER — HEPARIN (PORCINE) IN NACL 1000-0.9 UT/500ML-% IV SOLN
INTRAVENOUS | Status: AC
  Filled 2018-03-11: qty 1000

## 2018-03-11 MED ORDER — FENTANYL CITRATE (PF) 100 MCG/2ML IJ SOLN
INTRAMUSCULAR | Status: DC | PRN
  Administered 2018-03-11: 25 ug via INTRAVENOUS

## 2018-03-11 MED ORDER — FENTANYL CITRATE (PF) 100 MCG/2ML IJ SOLN
INTRAMUSCULAR | Status: AC
  Filled 2018-03-11: qty 2

## 2018-03-11 MED ORDER — SODIUM CHLORIDE 0.9% FLUSH: 3.0000 mL | Freq: Two times a day (BID) | INTRAVENOUS | Status: DC

## 2018-03-11 MED ORDER — VERAPAMIL HCL 2.5 MG/ML IV SOLN
INTRAVENOUS | Status: AC
  Filled 2018-03-11: qty 2

## 2018-03-11 MED ORDER — SODIUM CHLORIDE 0.9% FLUSH
3.0000 mL | Freq: Two times a day (BID) | INTRAVENOUS | Status: DC
  Administered 2018-03-11: 3 mL via INTRAVENOUS

## 2018-03-11 MED ORDER — SODIUM CHLORIDE 0.9 % WEIGHT BASED INFUSION: 1.0000 mL/kg/h | INTRAVENOUS | Status: DC

## 2018-03-11 MED ORDER — SODIUM CHLORIDE 0.9 % IV SOLN: INTRAVENOUS | Status: AC

## 2018-03-11 SURGICAL SUPPLY — 10 items
CATH INFINITI 5FR ANG PIGTAIL (CATHETERS) ×3 IMPLANT
CATH INFINITI 5FR JL4 (CATHETERS) ×3 IMPLANT
CATH INFINITI JR4 5F (CATHETERS) ×3 IMPLANT
DEVICE CLOSURE MYNXGRIP 5F (Vascular Products) ×3 IMPLANT
KIT MANI 3VAL PERCEP (MISCELLANEOUS) ×3 IMPLANT
NEEDLE PERC 18GX7CM (NEEDLE) ×3 IMPLANT
PACK CARDIAC CATH (CUSTOM PROCEDURE TRAY) ×3 IMPLANT
SET INTRO CAPELLA COAXIAL (SET/KITS/TRAYS/PACK) ×3 IMPLANT
SHEATH AVANTI 5FR X 11CM (SHEATH) ×3 IMPLANT
WIRE GUIDERIGHT .035X150 (WIRE) ×3 IMPLANT

## 2018-03-11 NOTE — Progress Notes (Signed)
ANTICOAGULATION CONSULT NOTE - Follow Up Consult  Pharmacy Consult for Heparin  Indication: chest pain/ACS  No Known Allergies  Patient Measurements: Height: 6\' 2"  (188 cm) Weight: 242 lb 4.8 oz (109.9 kg) IBW/kg (Calculated) : 82.2 Heparin Dosing Weight: 105.3 kg   Vital Signs: Temp: 98.3 F (36.8 C) (07/01 0516) Temp Source: Oral (07/01 0516) BP: 152/94 (07/01 0516) Pulse Rate: 54 (07/01 0516)  Labs: Recent Labs    03/09/18 0203 03/09/18 0511 03/09/18 1022 03/09/18 1703 03/09/18 1918 03/10/18 0424 03/11/18 0459  HGB 13.5  --   --   --   --  13.0  --   HCT 40.4  --   --   --   --  38.6*  --   PLT 157  --   --   --   --  145*  --   APTT  --   --  29  --   --   --   --   LABPROT  --   --  13.4  --   --   --   --   INR  --   --  1.03  --   --   --   --   HEPARINUNFRC  --   --   --   --  0.38 0.40 0.36  CREATININE 1.05  --   --   --   --   --   --   TROPONINI <0.03 0.03* 0.04* 0.04*  --   --   --     Estimated Creatinine Clearance: 65.4 mL/min (by C-G formula based on SCr of 1.05 mg/dL).   Medications:  Medications Prior to Admission  Medication Sig Dispense Refill Last Dose  . aspirin EC 81 MG tablet Take 81 mg by mouth daily.   03/08/2018 at Unknown time  . clopidogrel (PLAVIX) 75 MG tablet Take 1 tablet (75 mg total) by mouth daily. 90 tablet 3 03/08/2018 at Unknown time  . nystatin-triamcinolone ointment (MYCOLOG) Apply 1 application topically 2 (two) times daily. (Patient taking differently: Apply 1 application topically 2 (two) times daily as needed (rash). ) 30 g 1 prn at prn  . simvastatin (ZOCOR) 40 MG tablet Take 1 tablet (40 mg total) by mouth daily. 90 tablet 3 03/08/2018 at Unknown time  . tamsulosin (FLOMAX) 0.4 MG CAPS capsule Take 0.4 mg by mouth daily after supper.  30 capsule  03/08/2018 at Unknown time  . TRAVATAN Z 0.004 % SOLN ophthalmic solution INT 1 GTT INTO OU HS  4 Not Taking at Unknown time    Assessment: Patient is a 84 year old male with  PMH of CAGB about 22 years ago. Presents with chest pain, slightly elevated troponin. Pharmacy consulted to dose heparin drip. Plan is for cath on Monday. Baseline labs ordered. CBC WNL. No PTA anticoagulation seen.   Goal of Therapy:  Heparin level 0.3-0.7 units/ml Monitor platelets by anticoagulation protocol: Yes   Plan:  6/30: HL @ 0424 = 0.4 Will continue this pt on current rate and recheck HL on 7/1 with AM labs.   7/1: HL @ 0500 = 0.36 Will continue this pt on current rate and recheck HL on 7/2 with AM labs.   Kellsie Grindle D 03/11/2018,5:31 AM

## 2018-03-11 NOTE — Progress Notes (Signed)
Matthew Bryan at [place] NAME: Matthew Bryan    MR#:  [id number]  DATE OF BIRTH:  Jan 30, 1929  SUBJECTIVE:   Patient presented to the hospital secondary to chest pain.  Seen by cardiology and plan for cardiac catheterization today.   Patient is currently chest pain-free and stable.  REVIEW OF SYSTEMS:    Review of Systems  Constitutional: Negative for chills and fever.  HENT: Negative for congestion and tinnitus.   Eyes: Negative for blurred vision and double vision.  Respiratory: Negative for cough, shortness of breath and wheezing.   Cardiovascular: Negative for chest pain, orthopnea and PND.  Gastrointestinal: Negative for abdominal pain, diarrhea, nausea and vomiting.  Genitourinary: Negative for dysuria and hematuria.  Neurological: Negative for dizziness, sensory change and focal weakness.  All other systems reviewed and are negative.   Nutrition: Heart Healthy Tolerating Diet: Yes Tolerating PT:  Ambulatory  DRUG ALLERGIES:  No Known Allergies  VITALS:  Blood pressure (!) 151/68, pulse 97, temperature 97.9 F (36.6 C), resp. rate 18, height 6\' 2"  (1.88 m), weight 108.3 kg (238 lb 12.8 oz), SpO2 95 %.  PHYSICAL EXAMINATION:   Physical Exam  GENERAL:  84 y.o.-year-old patient lying in bed in no acute distress.  EYES: Pupils equal, round, reactive to light and accommodation. No scleral icterus. Extraocular muscles intact.  HEENT: Head atraumatic, normocephalic. Oropharynx and nasopharynx clear.  NECK:  Supple, no jugular venous distention. No thyroid enlargement, no tenderness.  LUNGS: Normal breath sounds bilaterally, no wheezing, rales, rhonchi. No use of accessory muscles of respiration.  CARDIOVASCULAR: S1, S2 normal. No murmurs, rubs, or gallops.  ABDOMEN: Soft, nontender, nondistended. Bowel sounds present. No organomegaly or mass.  EXTREMITIES: No cyanosis, clubbing or edema b/l.    NEUROLOGIC: Cranial nerves II through XII  are intact. No focal Motor or sensory deficits b/l.   PSYCHIATRIC: The patient is alert and oriented x 3.  SKIN: No obvious rash, lesion, or ulcer.     LABORATORY PANEL:   CBC Recent Labs  Lab 03/10/18 0424  WBC 10.6  HGB 13.0  HCT 38.6*  PLT 145*   ------------------------------------------------------------------------------------------------------------------  Chemistries  Recent Labs  Lab 03/09/18 0203  NA 140  K 4.0  CL 106  CO2 27  GLUCOSE 115*  BUN 20  CREATININE 1.05  CALCIUM 8.7*   ------------------------------------------------------------------------------------------------------------------  Cardiac Enzymes Recent Labs  Lab 03/09/18 1703  TROPONINI 0.04*   ------------------------------------------------------------------------------------------------------------------  RADIOLOGY:  No results found.   ASSESSMENT AND PLAN:   84 yo male previous history of hypertension, TIA, prostate cancer, coronary artery bypass graft surgery 20 years ago who presented to the hospital due to chest pain/pressure.  1.  Chest pain/pressure- patient symptoms are very typical of angina. - His cardiac markers are currently negative, EKG shows no acute ST or T wave changes.  Seen by cardiology and given his good story and risk factors plan for cardiac catheterization today. - Continue aspirin, Plavix, heparin drip, Isordil, simvastatin.  2.  BPH-no urinary retention.  Continue Flomax.  3.  Essential hypertension-continue Isordil  4. Hyperlipidemia - cont. Simvastatin  Dispo - pending cardiac cath today.   All the records are reviewed and case discussed with Care Management/Social Worker. Management plans discussed with the patient, family and they are in agreement.  CODE STATUS: Full code  DVT Prophylaxis: Hep. gtt  TOTAL TIME TAKING CARE OF THIS PATIENT: 25 minutes.   POSSIBLE D/C IN 1-2 DAYS, DEPENDING ON CLINICAL CONDITION.  Henreitta Leber M.D on  03/11/2018 at 2:08 PM  Between 7am to 6pm - Pager - [phone]  After 6pm go to [email or web address] - Proofreader  [facility]  Office  [phone]  CC: Primary care physician; Virginia Crews, MD

## 2018-03-11 NOTE — Progress Notes (Signed)
Progress Note  Patient Name: Matthew Bryan Date of Encounter: 03/11/2018  Primary Cardiologist: Rockey Situ  Subjective   No chest pain or SOB. For LHC today. Remains on heparin gtt.   Inpatient Medications    Scheduled Meds: . [START ON 03/12/2018] aspirin  81 mg Oral Pre-Cath  . aspirin EC  81 mg Oral Daily  . clopidogrel  75 mg Oral Daily  . docusate sodium  100 mg Oral BID  . isosorbide dinitrate  10 mg Oral BID  . latanoprost  1 drop Both Eyes QHS  . simvastatin  40 mg Oral Daily  . sodium chloride flush  3 mL Intravenous Q12H  . tamsulosin  0.4 mg Oral QPC supper   Continuous Infusions: . sodium chloride    . [START ON 03/12/2018] sodium chloride     Followed by  . [START ON 03/12/2018] sodium chloride    . heparin 1,300 Units/hr (03/11/18 0116)   PRN Meds: sodium chloride, acetaminophen **OR** acetaminophen, ondansetron **OR** ondansetron (ZOFRAN) IV, sodium chloride flush   Vital Signs    Vitals:   03/10/18 0745 03/10/18 1514 03/10/18 1940 03/11/18 0516  BP: (!) 149/85 (!) 152/59 (!) 163/93 (!) 152/94  Pulse: 61 62 (!) 57 (!) 54  Resp: 18 18 18 16   Temp:  98 F (36.7 C) 98.3 F (36.8 C) 98.3 F (36.8 C)  TempSrc:   Oral Oral  SpO2: 98% 98% 97% 96%  Weight:    238 lb 12.8 oz (108.3 kg)  Height:        Intake/Output Summary (Last 24 hours) at 03/11/2018 1010 Last data filed at 03/11/2018 0740 Gross per 24 hour  Intake -  Output 3200 ml  Net -3200 ml   Filed Weights   03/09/18 0459 03/10/18 0428 03/11/18 0516  Weight: 245 lb (111.1 kg) 242 lb 4.8 oz (109.9 kg) 238 lb 12.8 oz (108.3 kg)    Telemetry    NSR, bundle branch block, PVCs/PACs - Personally Reviewed  ECG    n/a - Personally Reviewed  Physical Exam   GEN: No acute distress.   Neck: No JVD. Cardiac: RRR, no murmurs, rubs, or gallops.  Respiratory: Clear to auscultation bilaterally.  GI: Soft, nontender, non-distended.   MS: No edema; No deformity. Neuro:  Alert and oriented x 3; Nonfocal.   Psych: Normal affect.  Labs    Chemistry Recent Labs  Lab 03/09/18 0203  NA 140  K 4.0  CL 106  CO2 27  GLUCOSE 115*  BUN 20  CREATININE 1.05  CALCIUM 8.7*  GFRNONAA >60  GFRAA >60  ANIONGAP 7     Hematology Recent Labs  Lab 03/09/18 0203 03/10/18 0424  WBC 10.7* 10.6  RBC 4.66 4.47  HGB 13.5 13.0  HCT 40.4 38.6*  MCV 86.6 86.3  MCH 28.9 29.0  MCHC 33.3 33.7  RDW 15.3* 14.7*  PLT 157 145*    Cardiac Enzymes Recent Labs  Lab 03/09/18 0203 03/09/18 0511 03/09/18 1022 03/09/18 1703  TROPONINI <0.03 0.03* 0.04* 0.04*   No results for input(s): TROPIPOC in the last 168 hours.   BNPNo results for input(s): BNP, PROBNP in the last 168 hours.   DDimer No results for input(s): DDIMER in the last 168 hours.   Radiology    No results found.  Cardiac Studies   None  Patient Profile     84 y.o. male with history of CAD s/p CABG 22 years prior, HLD, prostate cancer, and TIA admitted with unstable  angina.   Assessment & Plan    1. Unstable angina/CAD: -Currently chest pain free -Troponin peaked at 0.04 -Heparin gtt -ASA, Plavix, Isordil -For LHC this morning  2. HLD: -Goal < 70 -LDL this admission 78 -Prefers to remain on simvastatin rather than starting Lipitor in place of simvastatin -Lifestyle changes  3. HTN: -BP elevated this admission -HR precludes addition of beta blocker at this time -IF BP remains elevated following LHC add antihypertensive   For questions or updates, please contact [place] Please consult [email or web address] for contact info under Cardiology/STEMI.    Signed, Christell Faith, PA-C [place] Pager: [phone] 03/11/2018, 10:10 AM

## 2018-03-11 NOTE — Interval H&P Note (Signed)
Cath Lab Visit (complete for each Cath Lab visit)  Clinical Evaluation Leading to the Procedure:   ACS: Yes.   n/a  Non-ACS:   n/a   History and Physical Interval Note:  03/11/2018 3:57 PM  Matthew Bryan  has presented today for surgery, with the diagnosis of unstable angina  The various methods of treatment have been discussed with the patient and family. After consideration of risks, benefits and other options for treatment, the patient has consented to  Procedure(s): LEFT HEART CATH AND CORONARY ANGIOGRAPHY (N/A) as a surgical intervention .  The patient's history has been reviewed, patient examined, no change in status, stable for surgery.  I have reviewed the patient's chart and labs.  Questions were answered to the patient's satisfaction.     Kathlyn [place]

## 2018-03-11 NOTE — Progress Notes (Signed)
Patient clinically stable post heart cath per Dr Fletcher Anon, vitals stable. Right groin without bleeding nor hematoma. Denies complaints. Family at bedside. Dr Fletcher Anon out to speak with family and patient with questions answered regarding results of cath.  Report called to Lucina Mellow on telemetry with questions answered and plan reviewed.

## 2018-03-12 ENCOUNTER — Encounter: Payer: Self-pay | Admitting: Cardiovascular Disease

## 2018-03-12 ENCOUNTER — Telehealth: Payer: Self-pay | Admitting: Family Medicine

## 2018-03-12 DIAGNOSIS — E785 Hyperlipidemia, unspecified: Secondary | ICD-10-CM | POA: Diagnosis not present

## 2018-03-12 DIAGNOSIS — I25110 Atherosclerotic heart disease of native coronary artery with unstable angina pectoris: Secondary | ICD-10-CM

## 2018-03-12 DIAGNOSIS — R079 Chest pain, unspecified: Secondary | ICD-10-CM | POA: Diagnosis not present

## 2018-03-12 DIAGNOSIS — I2510 Atherosclerotic heart disease of native coronary artery without angina pectoris: Secondary | ICD-10-CM | POA: Diagnosis not present

## 2018-03-12 LAB — BASIC METABOLIC PANEL
ANION GAP: 9 (ref 5–15)
BUN: 21 mg/dL (ref 8–23)
CALCIUM: 8.5 mg/dL — AB (ref 8.9–10.3)
CO2: 24 mmol/L (ref 22–32)
Chloride: 106 mmol/L (ref 98–111)
Creatinine, Ser: 0.96 mg/dL (ref 0.61–1.24)
Glucose, Bld: 112 mg/dL — ABNORMAL HIGH (ref 70–99)
POTASSIUM: 3.9 mmol/L (ref 3.5–5.1)
SODIUM: 139 mmol/L (ref 135–145)

## 2018-03-12 MED ORDER — ISOSORBIDE MONONITRATE ER 30 MG PO TB24: 30.0000 mg | ORAL_TABLET | Freq: Every day | ORAL | 0 refills | Status: DC

## 2018-03-12 MED ORDER — ISOSORBIDE MONONITRATE ER 30 MG PO TB24: 30.0000 mg | ORAL_TABLET | Freq: Every day | ORAL | Status: DC

## 2018-03-12 MED ORDER — ISOSORBIDE MONONITRATE ER 30 MG PO TB24: 30.0000 mg | ORAL_TABLET | Freq: Every day | ORAL | 1 refills | Status: DC

## 2018-03-12 NOTE — Progress Notes (Signed)
Progress Note  Patient Name: Matthew Bryan Date of Encounter: 03/12/2018  Primary Cardiologist: Rockey Situ  Subjective   No chest pain or SOB. Has not ambulated yet. [place] 7/1 showed patent grafts as detailed below. Vitals and labs stable.   Inpatient Medications    Scheduled Meds: . aspirin EC  81 mg Oral Daily  . clopidogrel  75 mg Oral Daily  . docusate sodium  100 mg Oral BID  . [START ON 03/13/2018] isosorbide mononitrate  30 mg Oral Daily  . latanoprost  1 drop Both Eyes QHS  . simvastatin  40 mg Oral Daily  . sodium chloride flush  3 mL Intravenous Q12H  . tamsulosin  0.4 mg Oral QPC supper   Continuous Infusions: . sodium chloride     PRN Meds: sodium chloride, acetaminophen **OR** acetaminophen, ondansetron **OR** ondansetron (ZOFRAN) IV, sodium chloride flush   Vital Signs    Vitals:   03/11/18 1749 03/11/18 2003 03/12/18 0509 03/12/18 0809  BP: 126/83 (!) 146/61 (!) 145/61 (!) 149/51  Pulse: (!) 57 (!) 57 (!) 57 (!) 57  Resp: 18 17 17    Temp:  (!) 97.4 F (36.3 C) 97.8 F (36.6 C) 97.9 F (36.6 C)  TempSrc:  Oral Oral Oral  SpO2: 97% 98% 95% 95%  Weight:   238 lb 4.8 oz (108.1 kg)   Height:        Intake/Output Summary (Last 24 hours) at 03/12/2018 1155 Last data filed at 03/12/2018 1050 Gross per 24 hour  Intake 660 ml  Output 1325 ml  Net -665 ml   Filed Weights   03/10/18 0428 03/11/18 0516 03/12/18 0509  Weight: 242 lb 4.8 oz (109.9 kg) 238 lb 12.8 oz (108.3 kg) 238 lb 4.8 oz (108.1 kg)    Telemetry    NSR, PVCs/PACs - Personally Reviewed  ECG    n/a - Personally Reviewed  Physical Exam   GEN: No acute distress.   Neck: No JVD. Cardiac: RRR, no murmurs, rubs, or gallops. Right femoral cardiac cath site without bleeding, bruising, swelling, erythema, warmth, or TTP. No bruit.  Respiratory: Clear to auscultation bilaterally.  GI: Soft, nontender, non-distended.   MS: No edema; No deformity. Neuro:  Alert and oriented x 3; Nonfocal.    Psych: Normal affect.  Labs    Chemistry Recent Labs  Lab 03/09/18 0203 03/12/18 0535  NA 140 139  K 4.0 3.9  CL 106 106  CO2 27 24  GLUCOSE 115* 112*  BUN 20 21  CREATININE 1.05 0.96  CALCIUM 8.7* 8.5*  GFRNONAA >60 >60  GFRAA >60 >60  ANIONGAP 7 9     Hematology Recent Labs  Lab 03/09/18 0203 03/10/18 0424  WBC 10.7* 10.6  RBC 4.66 4.47  HGB 13.5 13.0  HCT 40.4 38.6*  MCV 86.6 86.3  MCH 28.9 29.0  MCHC 33.3 33.7  RDW 15.3* 14.7*  PLT 157 145*    Cardiac Enzymes Recent Labs  Lab 03/09/18 0203 03/09/18 0511 03/09/18 1022 03/09/18 1703  TROPONINI <0.03 0.03* 0.04* 0.04*   No results for input(s): TROPIPOC in the last 168 hours.   BNPNo results for input(s): BNP, PROBNP in the last 168 hours.   DDimer No results for input(s): DDIMER in the last 168 hours.   Radiology    No results found.  Cardiac Studies   LHC 03/11/2018: Conclusion     Ost Cx to Prox Cx lesion is 70% stenosed.  Prox LAD to Mid LAD lesion is 100%  stenosed.  Mid RCA to Dist RCA lesion is 100% stenosed.  Prox RCA to Mid RCA lesion is 80% stenosed.  Post Atrio lesion is 100% stenosed.  SVG graft was visualized by angiography.  The graft exhibits mild diffuse disease.  Ost 2nd Mrg to 2nd Mrg lesion is 70% stenosed.  Mid Cx lesion is 100% stenosed.  SVG graft was visualized by angiography.  The graft exhibits minimal luminal irregularities.  LIMA graft was visualized by angiography.  The graft exhibits no disease.  Mid LAD lesion is 50% stenosed.  There is mild left ventricular systolic dysfunction.  LV end diastolic pressure is mildly elevated.  The left ventricular ejection fraction is 45-50% by visual estimate.   1.  Significant underlying three-vessel coronary artery disease with patent grafts including LIMA to LAD, SVG to OM 3 and SVG to right PDA. 2.  Mildly reduced LV systolic function with an EF of 45 to 50% and mildly elevated left ventricular  end-diastolic pressure.  Recommendations: No culprit is identified for unstable angina.  Continue aggressive medical therapy.  I suspect that the chest pain was likely noncardiac.     Patient Profile     84 y.o. male with history of CAD s/p CABG 22 years prior, HLD, prostate cancer, and TIA admitted with unstable angina.  Assessment & Plan    1. Unstable angina/CAD: -Currently chest pain free -Troponin peaked at 0.04 -LHC 7/1 showed patent bypass grafts with recommendation for medical management -ASA, Plavix, Imdur -Post-cath instructions  2. HLD: -Goal < 70 -LDL this admission 78 -Prefers to remain on simvastatin rather than starting Lipitor in place of simvastatin -Lifestyle changes  3. HTN: -BP reasonably controlled -HR precludes addition of beta blocker at this time -Imdur    For questions or updates, please contact [place] Please consult [email or web address] for contact info under Cardiology/STEMI.    Signed, Christell Faith, PA-C [place] Pager: [phone] 03/12/2018, 11:55 AM

## 2018-03-12 NOTE — Plan of Care (Signed)
Femoral site is without any incidence. No complaints of pain.

## 2018-03-12 NOTE — Discharge Summary (Signed)
[facility] [place] NAME: Matthew Bryan    MR#:  [id number]  DATE OF BIRTH:  Oct 25, 1928  DATE OF ADMISSION:  03/09/2018 ADMITTING PHYSICIAN: Harrie Foreman, MD  DATE OF DISCHARGE: 03/12/2018 12:13 PM  PRIMARY CARE PHYSICIAN: Virginia Crews, MD    ADMISSION DIAGNOSIS:  Chest pain, unspecified type [R07.9]  DISCHARGE DIAGNOSIS:  Active Problems:   Chest pain   SECONDARY DIAGNOSIS:   Past Medical History:  Diagnosis Date  . History of heart attack   . Hyperlipidemia   . Prostate cancer ([place])   . Sepsis due to urinary tract infection ([place])   . TIA (transient ischemic attack)     HOSPITAL COURSE:   84 yo male previous history of hypertension, TIA, prostate cancer, coronary artery bypass graft surgery 20 years ago who presented to the hospital due to chest pain/pressure.  1.  Chest pain/pressure-  given his risk factors and bypass surgery many years ago and symptoms consistent with angina patient was admitted to the hospital.  He was observed on telemetry, his cardiac markers x3 were negative. -A cardiology consult was obtained and the recommended cardiac catheterization.  Patient underwent cardiac catheterization on March 11, 2018, this showed significant native coronary artery disease but patent bypass grafts. - Patient is presently hemodynamically stable and chest pain-free and therefore being discharged home.  Patient's blood pressure regimen was changed.  Patient was on Isordil and was changed to Imdur as per cardiology upon discharge. -She will continue his aspirin, Plavix  2.  BPH-no urinary retention.  pt. Will Continue Flomax.  3.  Essential hypertension- Changed from Isordil to Imdur  4. Hyperlipidemia - pt. Will cont. Simvastatin   DISCHARGE CONDITIONS:   Stable  CONSULTS OBTAINED:  Treatment Team:  Josue Hector, MD  DRUG ALLERGIES:  No Known Allergies  DISCHARGE MEDICATIONS:   Allergies as of  03/12/2018   No Known Allergies     Medication List    TAKE these medications   aspirin EC 81 MG tablet Take 81 mg by mouth daily.   clopidogrel 75 MG tablet Commonly known as:  PLAVIX Take 1 tablet (75 mg total) by mouth daily.   isosorbide mononitrate 30 MG 24 hr tablet Commonly known as:  IMDUR Take 1 tablet (30 mg total) by mouth daily. Start taking on:  03/13/2018   latanoprost 0.005 % ophthalmic solution Commonly known as:  XALATAN Place 1 drop into both eyes at bedtime.   nystatin-triamcinolone ointment Commonly known as:  MYCOLOG Apply 1 application topically 2 (two) times daily. What changed:    when to take this  reasons to take this   simvastatin 40 MG tablet Commonly known as:  ZOCOR Take 1 tablet (40 mg total) by mouth daily.   tamsulosin 0.4 MG Caps capsule Commonly known as:  FLOMAX Take 0.4 mg by mouth daily after supper.   TRAVATAN Z 0.004 % Soln ophthalmic solution Generic drug:  Travoprost (BAK Free) INT 1 GTT INTO OU HS         DISCHARGE INSTRUCTIONS:   DIET:  Cardiac diet  DISCHARGE CONDITION:  Stable  ACTIVITY:  Activity as tolerated  OXYGEN:  Home Oxygen: No.   Oxygen Delivery: room air  DISCHARGE LOCATION:  home   If you experience worsening of your admission symptoms, develop shortness of breath, life threatening emergency, suicidal or homicidal thoughts you must seek medical attention immediately by calling 911 or calling your MD immediately  if  symptoms less severe.  You Must read complete instructions/literature along with all the possible adverse reactions/side effects for all the Medicines you take and that have been prescribed to you. Take any new Medicines after you have completely understood and accpet all the possible adverse reactions/side effects.   Please note  You were cared for by a hospitalist during your hospital stay. If you have any questions about your discharge medications or the care you received while  you were in the hospital after you are discharged, you can call the unit and asked to speak with the hospitalist on call if the hospitalist that took care of you is not available. Once you are discharged, your primary care physician will handle any further medical issues. Please note that NO REFILLS for any discharge medications will be authorized once you are discharged, as it is imperative that you return to your primary care physician (or establish a relationship with a primary care physician if you do not have one) for your aftercare needs so that they can reassess your need for medications and monitor your lab values.     Today   No acute chest pain.  No complaints overnight. Will d/c home today.   VITAL SIGNS:  Blood pressure (!) 149/51, pulse (!) 57, temperature 97.9 F (36.6 C), temperature source Oral, resp. rate 17, height 6\' 2"  (1.88 m), weight 108.1 kg (238 lb 4.8 oz), SpO2 95 %.  I/O:    Intake/Output Summary (Last 24 hours) at 03/12/2018 1648 Last data filed at 03/12/2018 1050 Gross per 24 hour  Intake 660 ml  Output 1325 ml  Net -665 ml    PHYSICAL EXAMINATION:   GENERAL:  84 y.o.-year-old patient lying in bed in no acute distress.  EYES: Pupils equal, round, reactive to light and accommodation. No scleral icterus. Extraocular muscles intact.  HEENT: Head atraumatic, normocephalic. Oropharynx and nasopharynx clear.  NECK:  Supple, no jugular venous distention. No thyroid enlargement, no tenderness.  LUNGS: Normal breath sounds bilaterally, no wheezing, rales, rhonchi. No use of accessory muscles of respiration.  CARDIOVASCULAR: S1, S2 normal. No murmurs, rubs, or gallops.  ABDOMEN: Soft, nontender, nondistended. Bowel sounds present. No organomegaly or mass.  EXTREMITIES: No cyanosis, clubbing or edema b/l.    NEUROLOGIC: Cranial nerves II through XII are intact. No focal Motor or sensory deficits b/l.   PSYCHIATRIC: The patient is alert and oriented x 3.  SKIN: No  obvious rash, lesion, or ulcer.     DATA REVIEW:   CBC Recent Labs  Lab 03/10/18 0424  WBC 10.6  HGB 13.0  HCT 38.6*  PLT 145*    Chemistries  Recent Labs  Lab 03/12/18 0535  NA 139  K 3.9  CL 106  CO2 24  GLUCOSE 112*  BUN 21  CREATININE 0.96  CALCIUM 8.5*    Cardiac Enzymes Recent Labs  Lab 03/09/18 1703  TROPONINI 0.04*    Microbiology Results  No results found for this or any previous visit.  RADIOLOGY:  No results found.    Management plans discussed with the patient, family and they are in agreement.  CODE STATUS:  Code Status History    Date Active Date Inactive Code Status Order ID Comments User Context   03/09/2018 0449 03/12/2018 1513 Full Code [id number]  Harrie Foreman, MD Inpatient     TOTAL TIME TAKING CARE OF THIS PATIENT: 40 minutes.    Henreitta Leber M.D on 03/12/2018 at 4:48 PM  Between 7am to 6pm -  Pager - [phone]  After 6pm go to [email or web address] - Proofreader  [facility]  Office  [phone]  CC: Primary care physician; Virginia Crews, MD

## 2018-03-12 NOTE — Telephone Encounter (Signed)
[facility] called and scheduled hospital f/u with Dr. Jacinto Reap 03/21/18. Pt is being discharged today 03/12/18. Please advise. Thanks TNP

## 2018-03-13 NOTE — Telephone Encounter (Signed)
Transition Care Management Follow-up Telephone Call  How have you been since you were released from the hospital? Doing better, declines chest pain, SOB or n/v/d.   Do you understand why you were in the hospital? yes  Do you have a copy of your discharge instructions Yes Do you understand the discharge instrcutions? yes  Where were you discharged to? Home  Do you have support at home? Yes    Items Reviewed:  Medications obtained Yes  Medications reviewed: Yes  Dietary changes reviewed: yes, low sodium heart healthy  Home Health? N/A  DME ordered at discharge obtained? NA  Medical supplies: NA    Functional Questionnaire:   Activities of Daily Living (ADLs):   He states they are independent in the following: ambulation, bathing and hygiene, feeding, continence, grooming, toileting, dressing and medication management States they require assistance with the following: N/A  Any transportation issues/concerns?: no  Any patient concerns? no  Confirmed importance and date/time of follow-up visits scheduled with PCP: yes  Confirm appointment scheduled with specialist? No  Confirmed with patient if condition begins to worsen call PCP or If it's emergency go to the ER.

## 2018-03-15 NOTE — Telephone Encounter (Signed)
Has FU 03/21/2018.

## 2018-03-20 ENCOUNTER — Telehealth: Payer: Self-pay | Admitting: Family Medicine

## 2018-03-20 MED ORDER — CLOPIDOGREL BISULFATE 75 MG PO TABS: 75.0000 mg | ORAL_TABLET | Freq: Every day | ORAL | 3 refills | Status: AC

## 2018-03-20 MED ORDER — SIMVASTATIN 40 MG PO TABS: 40.0000 mg | ORAL_TABLET | Freq: Every day | ORAL | 3 refills | Status: AC

## 2018-03-20 MED ORDER — ISOSORBIDE MONONITRATE ER 30 MG PO TB24: 30.0000 mg | ORAL_TABLET | Freq: Every day | ORAL | 0 refills | Status: DC

## 2018-03-20 NOTE — Telephone Encounter (Signed)
Advised pt that we do not have an available time slot to see his wife tomorrow. He acknowledges understanding of this, but is requesting refills of simvastatin, plavix and isosorbide to be sent to [facility]. Please advise.

## 2018-03-20 NOTE — Telephone Encounter (Signed)
Patient's wife has not been seen yet.  They are new to the are from [place].  Matthew Bryan has an appt. With you tomorrow 03/21/18 and he wants to know if his wife can be seen at the same time as him.  She needs refills on medications.

## 2018-03-20 NOTE — Telephone Encounter (Signed)
Misread message. Matthew Bryan is an established pt, and only needs refills.Per Dr. Jacinto Reap, ok to refill her medications.

## 2018-03-20 NOTE — Telephone Encounter (Signed)
Ok to refill those meds.  90 day supplies  Does she at least have some appt to be seen as a new patient?  Virginia Crews, MD, MPH [facility] 03/20/2018 4:42 PM

## 2018-03-21 ENCOUNTER — Encounter: Payer: Self-pay | Admitting: Family Medicine

## 2018-03-21 ENCOUNTER — Ambulatory Visit (INDEPENDENT_AMBULATORY_CARE_PROVIDER_SITE_OTHER): Payer: Medicare Other | Admitting: Family Medicine

## 2018-03-21 VITALS — BP 140/62 | HR 65 | Temp 97.4°F | Resp 16 | Wt 244.0 lb

## 2018-03-21 DIAGNOSIS — R079 Chest pain, unspecified: Secondary | ICD-10-CM

## 2018-03-21 DIAGNOSIS — Z951 Presence of aortocoronary bypass graft: Secondary | ICD-10-CM | POA: Diagnosis not present

## 2018-03-21 MED ORDER — ISOSORBIDE MONONITRATE ER 30 MG PO TB24: 30.0000 mg | ORAL_TABLET | Freq: Every day | ORAL | 3 refills | Status: AC

## 2018-03-21 MED ORDER — ISOSORBIDE MONONITRATE ER 30 MG PO TB24: 30.0000 mg | ORAL_TABLET | Freq: Every day | ORAL | 3 refills | Status: DC

## 2018-03-21 NOTE — Progress Notes (Signed)
Patient: Matthew Bryan Male    DOB: 12-10-1928   84 y.o.   MRN: [id number] Visit Date: 03/21/2018  Today's Provider: Lavon Paganini, MD   I, Martha Clan, CMA, am acting as scribe for Lavon Paganini, MD.  Chief Complaint  Patient presents with  . Hospitalization Follow-up   Subjective:    HPI     Follow up Hospitalization  Patient was admitted to [facility] on 03/09/2018 and discharged on 03/12/2018. He was treated for chest pain. Treatment for this included cardiac cath, which showed significant native CAD but patent bypass graft. Pt was started on Imdur. Telephone follow up was done on 03/13/2018 He reports inadequate compliance with treatment. Pt states he sent the form to [facility] to receive the Imdur prescription, but this has not yet been mailed to him. He reports this condition is Improved. ------------------------------------------------------------------------------------    No Known Allergies   Current Outpatient Medications:  .  aspirin EC 81 MG tablet, Take 81 mg by mouth daily., Disp: , Rfl:  .  clopidogrel (PLAVIX) 75 MG tablet, Take 1 tablet (75 mg total) by mouth daily., Disp: 90 tablet, Rfl: 3 .  latanoprost (XALATAN) 0.005 % ophthalmic solution, Place 1 drop into both eyes at bedtime., Disp: , Rfl:  .  nystatin-triamcinolone ointment (MYCOLOG), Apply 1 application topically 2 (two) times daily. (Patient taking differently: Apply 1 application topically 2 (two) times daily. ), Disp: 30 g, Rfl: 1 .  simvastatin (ZOCOR) 40 MG tablet, Take 1 tablet (40 mg total) by mouth daily., Disp: 90 tablet, Rfl: 3 .  tamsulosin (FLOMAX) 0.4 MG CAPS capsule, Take 0.4 mg by mouth daily after supper. , Disp: 30 capsule, Rfl:  .  isosorbide mononitrate (IMDUR) 30 MG 24 hr tablet, Take 1 tablet (30 mg total) by mouth daily. (Patient not taking: Reported on 03/21/2018), Disp: 90 tablet, Rfl: 0  Review of Systems  Constitutional: Negative for activity change, appetite  change, chills, diaphoresis, fatigue, fever and unexpected weight change.  Respiratory: Negative for shortness of breath.   Cardiovascular: Negative for chest pain, palpitations and leg swelling.    Social History   Tobacco Use  . Smoking status: Former Smoker    Packs/day: 1.00    Years: 10.00    Pack years: 10.00    Types: Cigarettes    Last attempt to quit: 09/10/1964    Years since quitting: 53.5  . Smokeless tobacco: Never Used  Substance Use Topics  . Alcohol use: Never    Frequency: Never   Objective:   BP 140/62 (BP Location: Left Arm, Patient Position: Sitting, Cuff Size: Large)   Pulse 65   Temp (!) 97.4 F (36.3 C) (Oral)   Resp 16   Wt 244 lb (110.7 kg)   SpO2 96%   BMI 31.33 kg/m  Vitals:   03/21/18 1120  BP: 140/62  Pulse: 65  Resp: 16  Temp: (!) 97.4 F (36.3 C)  TempSrc: Oral  SpO2: 96%  Weight: 244 lb (110.7 kg)     Physical Exam  Constitutional: He is oriented to person, place, and time. He appears well-developed and well-nourished. No distress.  HENT:  Head: Normocephalic and atraumatic.  Eyes: Conjunctivae are normal. No scleral icterus.  Neck: Neck supple. No thyromegaly present.  Cardiovascular: Normal rate, regular rhythm, normal heart sounds and intact distal pulses.  No murmur heard. Pulmonary/Chest: Effort normal and breath sounds normal. No respiratory distress. He has no wheezes. He has no rales.  Abdominal:  Soft. He exhibits no distension. There is no tenderness.  Musculoskeletal: He exhibits no edema.  Lymphadenopathy:    He has no cervical adenopathy.  Neurological: He is alert and oriented to person, place, and time.  Skin: Skin is warm and dry. Capillary refill takes less than 2 seconds. No rash noted.  Psychiatric: He has a normal mood and affect. His behavior is normal.  Vitals reviewed.      Assessment & Plan:   Problem List Items Addressed This Visit      Other   Hx of CABG - Primary    Followed by Dr Rockey Situ  with Cardiology Cath during hospitalization shows clean bypass grafts Continue statin, Imdur, plavix and aspirin Currently asymptomatic and doing well Advised to call Cardiology office to get hospital f/u visit      Chest pain    Now resolved Patent bypass grafts as above          Return if symptoms worsen or fail to improve. Has CPE in 06/2018 scheduled.   The entirety of the information documented in the History of Present Illness, Review of Systems and Physical Exam were personally obtained by me. Portions of this information were initially documented by Raquel Sarna Ratchford, CMA and reviewed by me for thoroughness and accuracy.    Virginia Crews, MD, MPH [facility] 03/21/2018 11:50 AM

## 2018-03-21 NOTE — Assessment & Plan Note (Signed)
Now resolved Patent bypass grafts as above

## 2018-03-21 NOTE — Assessment & Plan Note (Signed)
Followed by Dr Rockey Situ with Cardiology Cath during hospitalization shows clean bypass grafts Continue statin, Imdur, plavix and aspirin Currently asymptomatic and doing well Advised to call Cardiology office to get hospital f/u visit

## 2018-03-21 NOTE — Patient Instructions (Signed)
Angina Pectoris Angina pectoris is a very bad feeling in the chest, neck, or arm. Your doctor may call it angina. There are four types of angina. Angina is caused by a lack of blood in the middle and thickest layer of the heart wall (myocardium). Angina may feel like a crushing or squeezing pain in the chest. It may feel like tightness or heavy pressure in the chest. Some people say it feels like gas, heartburn, or indigestion. Some people have symptoms other than pain. These include:  Shortness of breath.  Cold sweats.  Feeling sick to your stomach (nausea).  Feeling light-headed.  Many women have chest discomfort and some of the other symptoms. However, women often have different symptoms, such as:  Feeling tired (fatigue).  Feeling nervous for no reason.  Feeling weak for no reason.  Dizziness or fainting.  Women may have angina without any symptoms. Follow these instructions at home:  Take medicines only as told by your doctor.  Take care of other health issues as told by your doctor. These include: ? High blood pressure (hypertension). ? Diabetes.  Follow a heart-healthy diet. Your doctor can help you to choose healthy food options and make changes.  Talk to your doctor to learn more about healthy cooking methods and use them. These include: ? Roasting. ? Grilling. ? Broiling. ? Baking. ? Poaching. ? Steaming. ? Stir-frying.  Follow an exercise program approved by your doctor.  Keep a healthy weight. Lose weight as told by your doctor.  Rest when you are tired.  Learn to manage stress.  Do not use any tobacco, such as cigarettes, chewing tobacco, or electronic cigarettes. If you need help quitting, ask your doctor.  If you drink alcohol, and your doctor says it is okay, limit yourself to no more than 1 drink per day. One drink equals 12 ounces of beer, 5 ounces of wine, or 1 ounces of hard liquor.  Stop illegal drug use.  Keep all follow-up visits as told  by your doctor. This is important. Do not take these medicines unless your doctor says that you can:  Nonsteroidal anti-inflammatory drugs (NSAIDs). These include: ? Ibuprofen. ? Naproxen. ? Celecoxib.  Vitamin supplements that have vitamin A, vitamin E, or both.  Hormone therapy that contains estrogen with or without progestin.  Get help right away if:  You have pain in your chest, neck, arm, jaw, stomach, or back that: ? Lasts more than a few minutes. ? Comes back. ? Does not get better after you take medicine under your tongue (sublingual nitroglycerin).  You have any of these symptoms for no reason: ? Gas, heartburn, or indigestion. ? Sweating a lot. ? Shortness of breath or trouble breathing. ? Feeling sick to your stomach or throwing up. ? Feeling more tired than usual. ? Feeling nervous or worrying more than usual. ? Feeling weak. ? Diarrhea.  You are suddenly dizzy or light-headed.  You faint or pass out. These symptoms may be an emergency. Do not wait to see if the symptoms will go away. Get medical help right away. Call your local emergency services (911 in the [place] Do not drive yourself to the hospital. This information is not intended to replace advice given to you by your health care provider. Make sure you discuss any questions you have with your health care provider. Document Released: 02/14/2008 Document Revised: 02/03/2016 Document Reviewed: 12/30/2013 Elsevier Interactive Patient Education  2017 Elsevier Inc.  

## 2018-03-21 NOTE — Progress Notes (Signed)
Called pt. He states he had these vaccines a [employer]. Unable to locate on downloadable records. Resent fax for this.

## 2018-04-29 DIAGNOSIS — B351 Tinea unguium: Secondary | ICD-10-CM | POA: Diagnosis not present

## 2018-04-30 DIAGNOSIS — L03032 Cellulitis of left toe: Secondary | ICD-10-CM | POA: Diagnosis not present

## 2018-06-13 ENCOUNTER — Ambulatory Visit: Payer: Self-pay

## 2018-06-27 ENCOUNTER — Encounter: Payer: Self-pay | Admitting: Family Medicine

## 2018-06-27 ENCOUNTER — Telehealth: Payer: Self-pay

## 2018-06-27 NOTE — Telephone Encounter (Signed)
Pt has moved back to [place] per son.  -MM

## 2019-01-22 IMAGING — CR DG CHEST 2V
1 series · 2 of 2 positions shown · non-contrast
Comparison: None.

CLINICAL DATA: Chest pain after fall

EXAM:
CHEST - 2 VIEW

[Series 1: dg chest 2 view · 0.14mm/px · 2 of 2 slices shown]
[im 1/2]
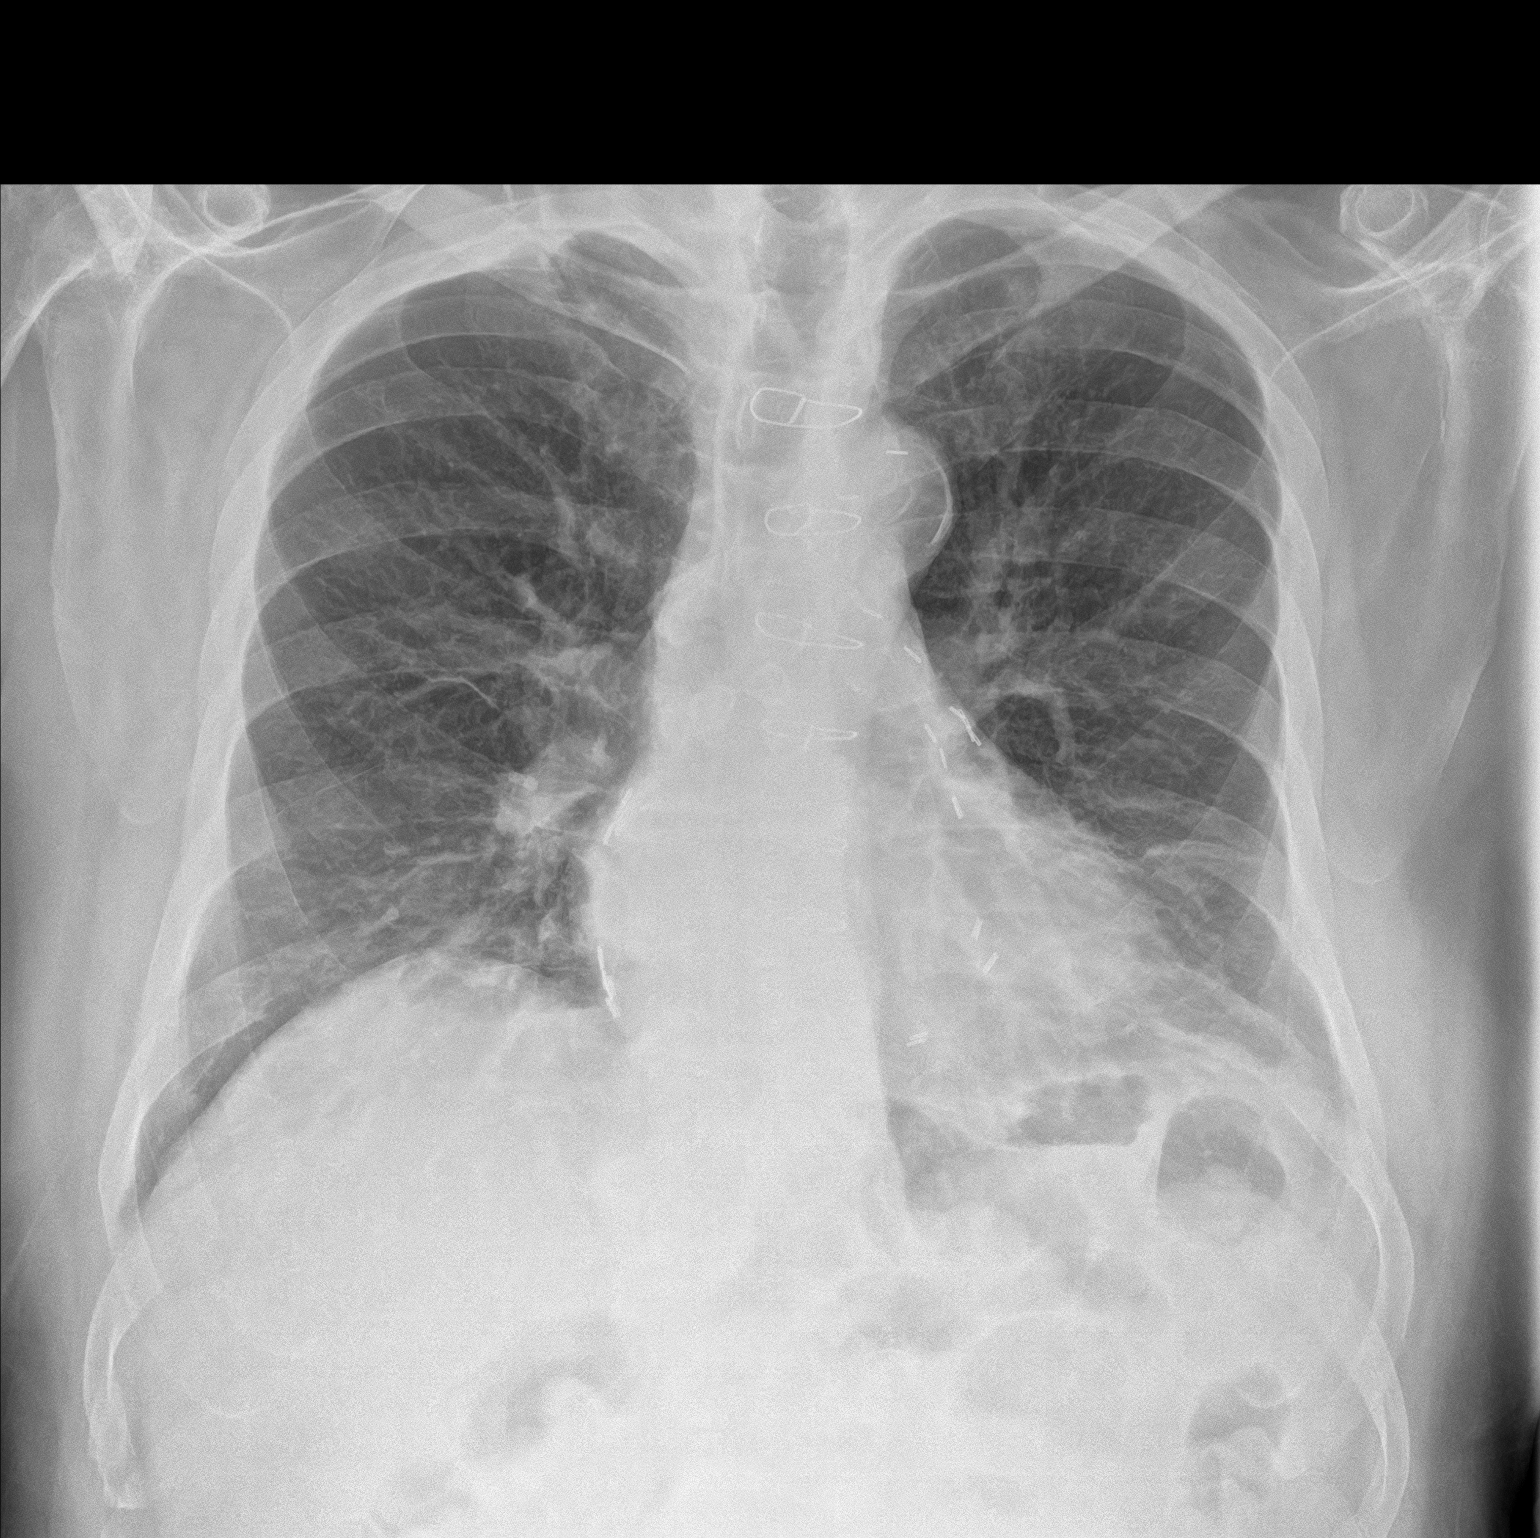
[im 2/2]
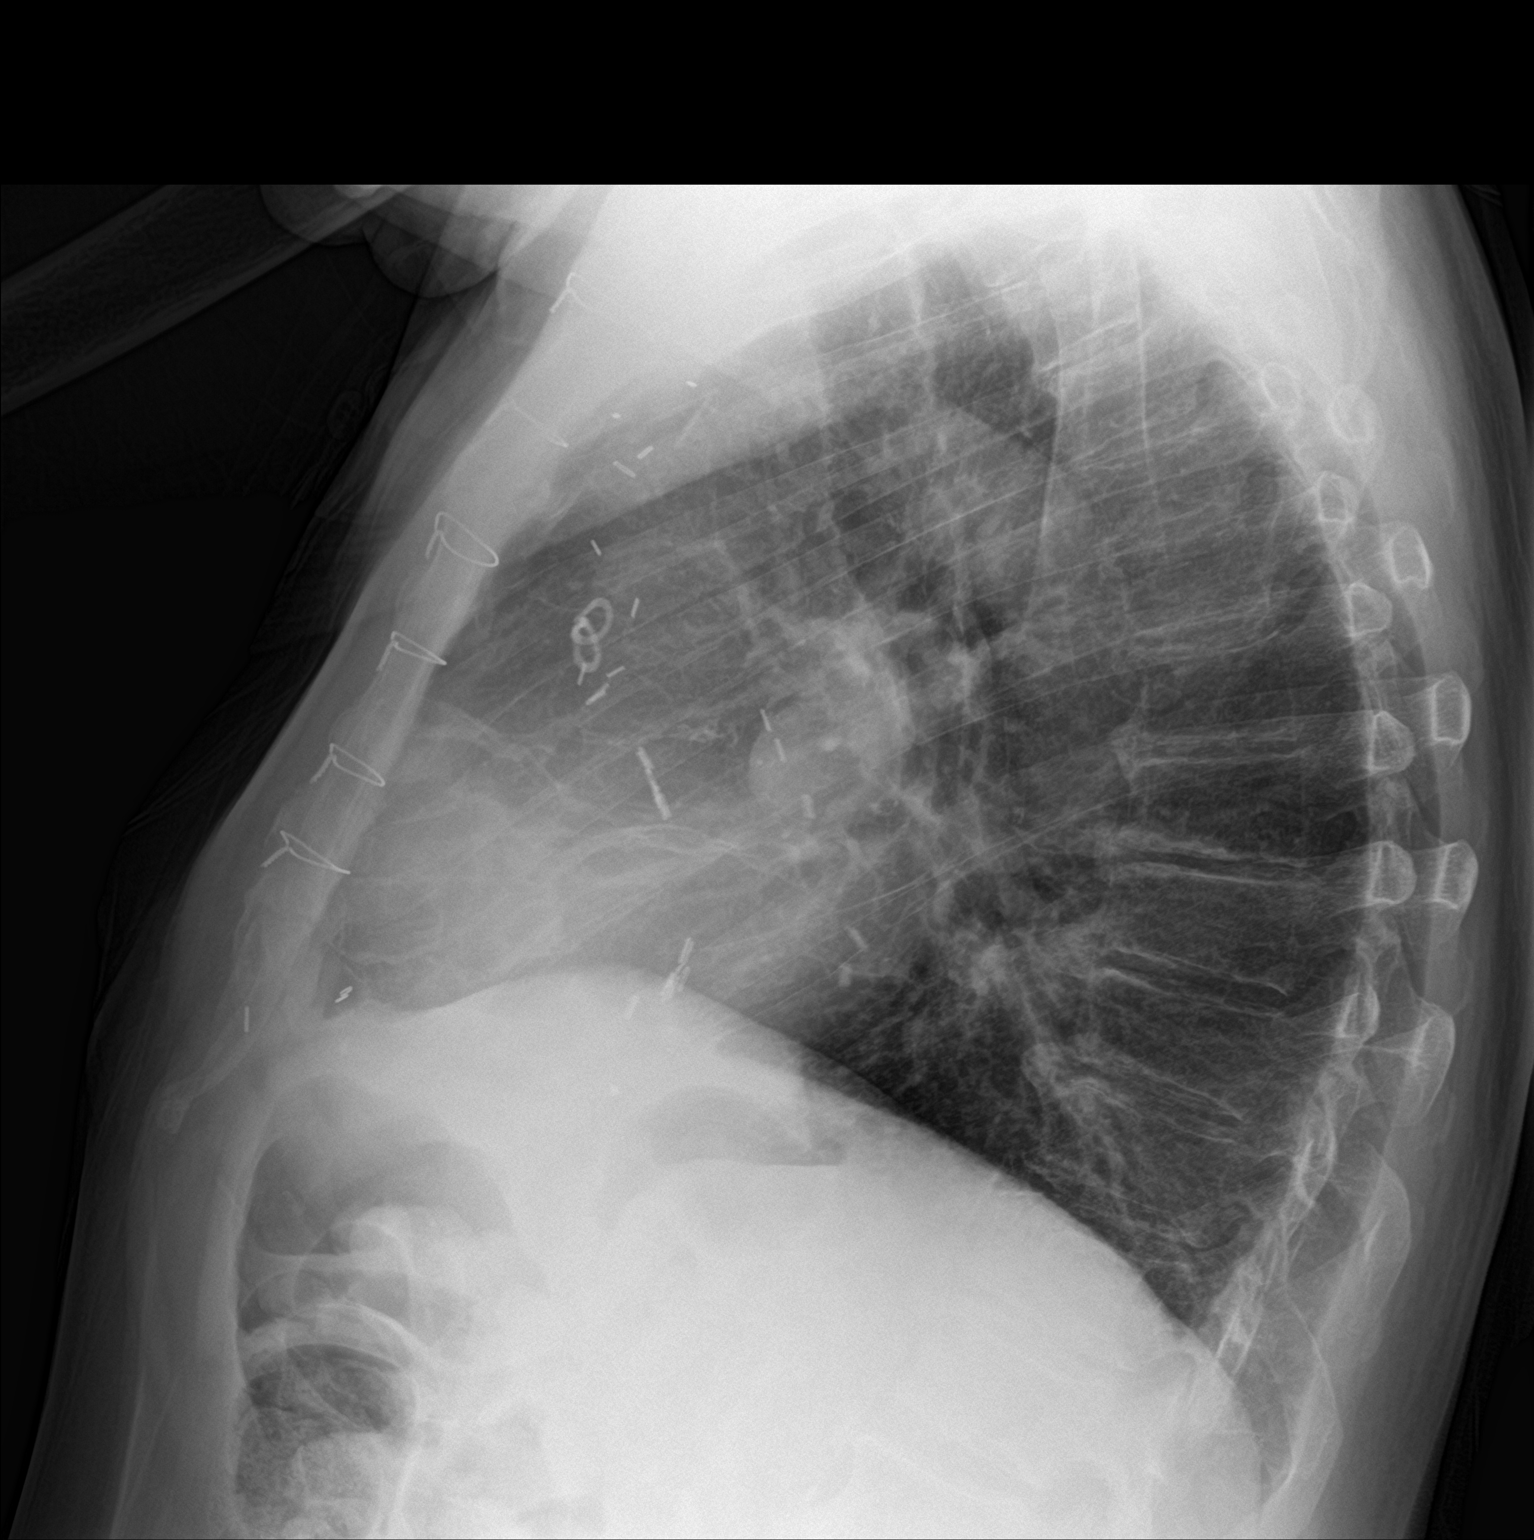

[2 of 2 positions shown; findings below may reference images not displayed]

FINDINGS: The heart size and mediastinal contours are within normal limits.
Aortic atherosclerosis at the arch without aneurysm. There is
uncoiling of the thoracic aorta. Patient is status post CABG with
intact median sternotomy sutures. There is bibasilar atelectasis
and/or scarring. No effusion or pneumothorax. Degenerative changes
are seen along the dorsal spine.
IMPRESSION: 1. Status post CABG.
2. Bibasilar atelectasis and/or scarring.
3. No active pulmonary disease.
4. Aortic atherosclerosis.

## 2019-01-28 ENCOUNTER — Other Ambulatory Visit: Payer: Self-pay

## 2019-01-28 ENCOUNTER — Telehealth (INDEPENDENT_AMBULATORY_CARE_PROVIDER_SITE_OTHER): Payer: Medicare Other | Admitting: Urology

## 2019-01-28 NOTE — Progress Notes (Signed)
Patient was scheduled for a telemedicine visit today for annual follow-up, however he has moved back to [place].  Hollice Espy, MD

## 2019-04-05 IMAGING — DX DG CHEST 1V PORT
1 series · 1 of 1 positions shown · non-contrast
Comparison: Chest radiograph dated 12/26/2017

CLINICAL DATA: 89-year-old male with chest pain.

EXAM:
PORTABLE CHEST 1 VIEW

[chest ap]
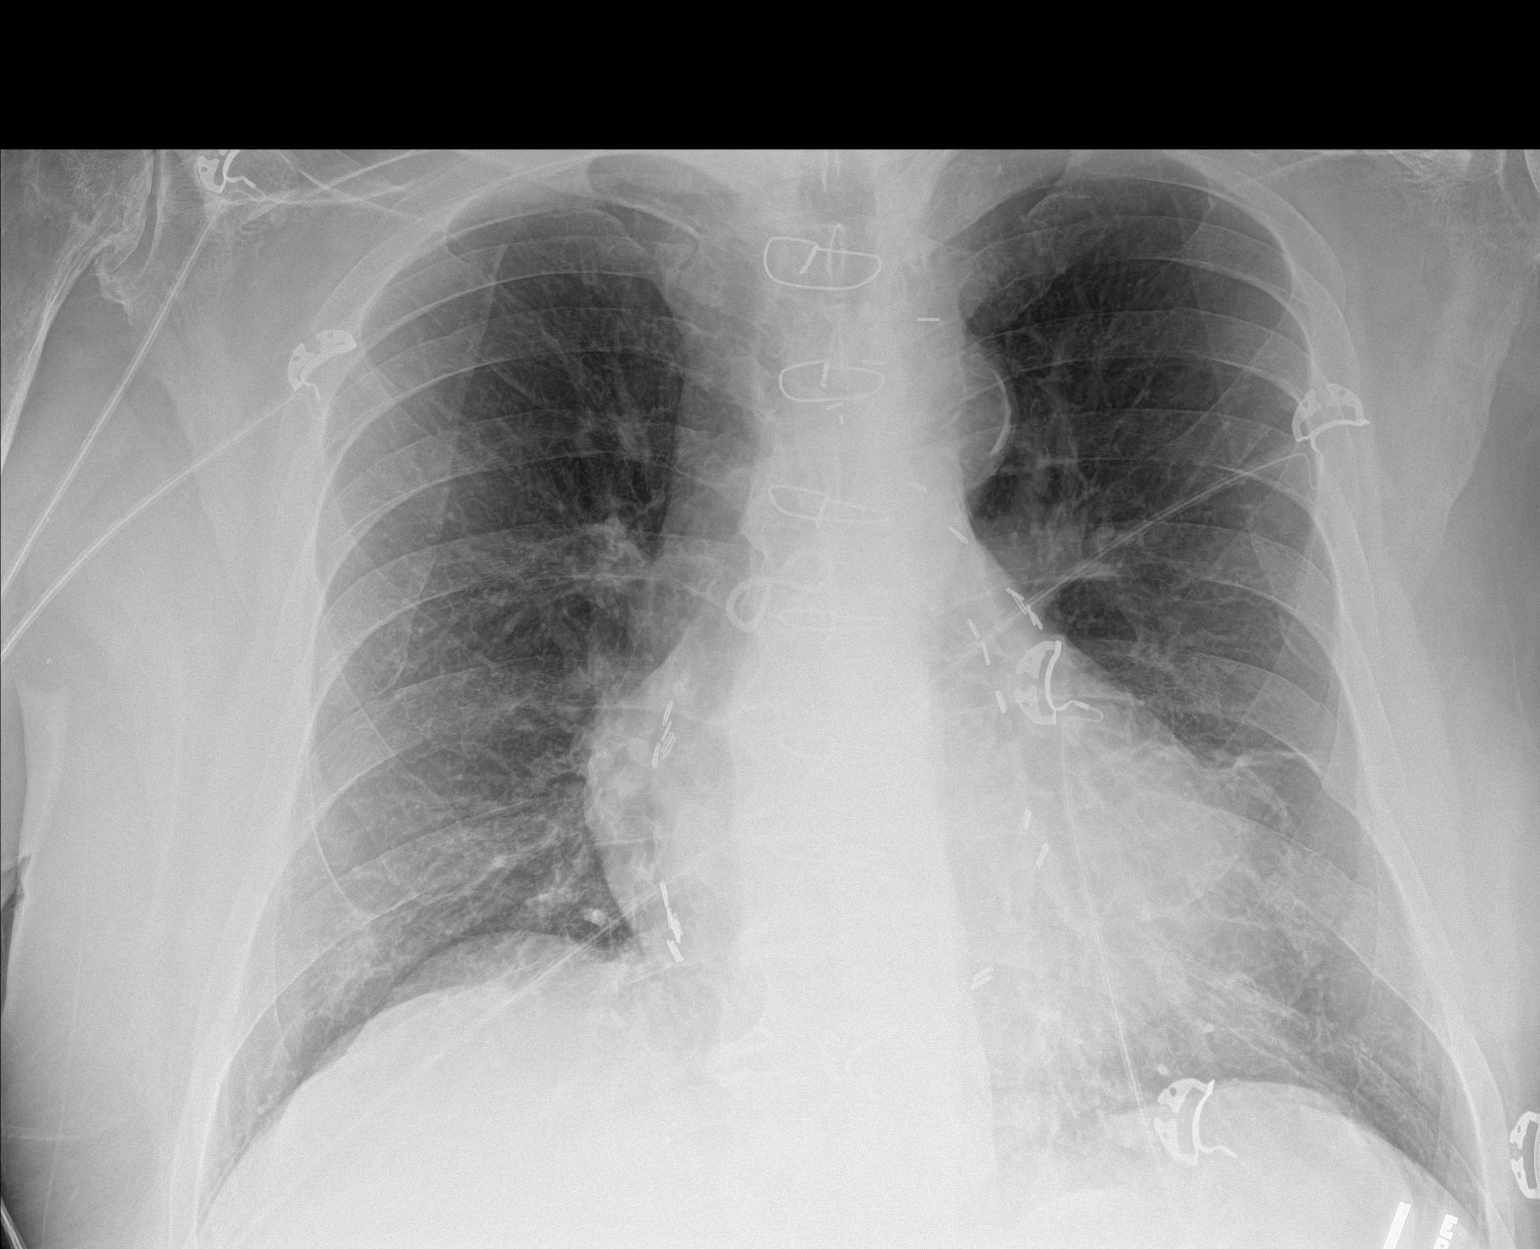

[1 of 1 positions shown; findings below may reference images not displayed]

FINDINGS: Emphysematous changes of the lungs with bibasilar
atelectasis/scarring. No focal consolidation, pleural effusion, or
pneumothorax. Stable cardiac silhouette. Median sternotomy wires and
CABG vascular clips. No acute osseous pathology.
IMPRESSION: No active disease.

## 2019-11-06 ENCOUNTER — Telehealth: Payer: Self-pay | Admitting: Cardiovascular Disease

## 2019-11-06 NOTE — Telephone Encounter (Signed)
[place] out of [place] and no longer wishes to follow up  Deleted recall

## 2020-11-09 ENCOUNTER — Emergency Department: Admit: 2020-11-09 | Payer: MEDICARE | Primary: Internal Medicine

## 2020-11-09 ENCOUNTER — Inpatient Hospital Stay: Admit: 2020-11-10 | Payer: MEDICARE | Primary: Internal Medicine

## 2020-11-09 ENCOUNTER — Inpatient Hospital Stay
Admit: 2020-11-09 | Discharge: 2020-11-23 | Disposition: A | Discharge status: Skilled Nursing Facility | Payer: MEDICARE | Source: Other Acute Inpatient Hospital | Admitting: Surgery

## 2020-11-09 DIAGNOSIS — S065X9A Traumatic subdural hemorrhage with loss of consciousness of unspecified duration, initial encounter: Secondary | ICD-10-CM

## 2020-11-09 LAB — BASIC METABOLIC PANEL
Anion Gap: 11 mmol/L (ref 7–16)
BUN: 18 mg/dL (ref 6–23)
CO2: 25 mmol/L (ref 22–29)
Calcium: 8.5 mg/dL — ABNORMAL LOW (ref 8.6–10.2)
Chloride: 104 mmol/L (ref 98–107)
Creatinine: 1 mg/dL (ref 0.7–1.2)
GFR African American: >60
GFR Non-African American: >60 mL/min/{1.73_m2} (ref >=60)
Glucose: 118 mg/dL — ABNORMAL HIGH (ref 74–99)
Potassium: 4.2 mmol/L (ref 3.5–5.0)
Sodium: 140 mmol/L (ref 132–146)

## 2020-11-09 LAB — PROTIME-INR
INR: 1.2
Protime: 13.3 s — ABNORMAL HIGH (ref 9.3–12.4)

## 2020-11-09 LAB — APTT: aPTT: 27.9 s (ref 24.5–35.1)

## 2020-11-09 LAB — SPECIMEN REJECTION

## 2020-11-09 MED ORDER — LEVETIRACETAM IN NACL 1000 MG/100ML IV SOLN
1000100 MG/100ML | Freq: Two times a day (BID) | INTRAVENOUS | Status: AC
Start: 2020-11-09 — End: 2020-11-09
  Administered 2020-11-10: 01:00:00 via INTRAVENOUS

## 2020-11-09 MED ORDER — IOPAMIDOL 76 % IV SOLN
76 % | Freq: Once | INTRAVENOUS | Status: AC | PRN
Start: 2020-11-09 — End: 2020-11-09
  Administered 2020-11-09: 22:00:00 via INTRAVENOUS

## 2020-11-09 NOTE — ED Provider Notes (Signed)
87 year old male presenting from an outside facility with concern for subdural hematoma.  Reportedly fell at a nursing facility.  Concern for multiple rib fractures, subdural.  Patient is awake alert but not oriented.  Cannot tell me his name, cannot provide any history at all.  Unknown if this is a change from his normal or if this is his baseline at this point.  Sudden fall, persistent, moderate to severe based on imaging, unknown if this is change his mentation, unknown for the makes better or worse so far.           Family History   Problem Relation Age of Onset   ??? Stroke Mother    ??? Heart Disease Father      Past Surgical History:   Procedure Laterality Date   ??? CARDIAC SURGERY     ??? CORONARY ANGIOPLASTY      pt had a triple by pass 1997   ??? CORONARY ANGIOPLASTY      pt had cat 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       Review of Systems   Unable to perform ROS: Mental status change        Physical Exam  Constitutional:       General: He is not in acute distress.     Appearance: He is well-developed.   HENT:      Head: Normocephalic and atraumatic.   Eyes:      Pupils: Pupils are equal, round, and reactive to light.   Neck:      Thyroid: No thyromegaly.   Cardiovascular:      Rate and Rhythm: Normal rate and regular rhythm.   Pulmonary:      Effort: Pulmonary effort is normal. No respiratory distress.      Breath sounds: Normal breath sounds. No wheezing.   Abdominal:      General: There is no distension.      Palpations: Abdomen is soft. There is no mass.      Tenderness: There is no abdominal tenderness. There is no guarding or rebound.   Musculoskeletal:         General: No tenderness. Normal range of motion.      Cervical back: Normal range of motion and neck supple.   Skin:     General: Skin is warm and dry.      Findings: No erythema.   Neurological:      Mental Status: He is alert. He is disoriented.      Cranial Nerves: No cranial nerve deficit.          Procedures     MDM     ED Course as of 11/10/20 1551   Tue  Nov 09, 2020   1440 Attempted to call the spouse based on the information in the medical record, phone is been disconnected. [SO]      ED Course User Index  [SO] Lyda KalataSteven Allea Kassner, DO      ED Course as of 11/10/20 1551   Tue Nov 09, 2020   1440 Attempted to call the spouse based on the information in the medical record, phone is been disconnected. [SO]      ED Course User Index  [SO] Lyda KalataSteven Amit Meloy, DO       --------------------------------------------- PAST HISTORY ---------------------------------------------  Past Medical History:  has a past medical history of Acute MI (HCC), CAD (coronary artery disease), Cholesterol serum increased, Hyperlipidemia, Prostate cancer (HCC), and Prostate cancer (HCC).    Past Surgical History:  has  a past surgical history that includes Cardiac surgery; Coronary angioplasty; and Coronary angioplasty.    Social History:  reports that he has quit smoking. He has a 10.00 pack-year smoking history. He quit smokeless tobacco use about 24 years ago. He reports that he does not drink alcohol and does not use drugs.    Family History: family history includes Heart Disease in his father; Stroke in his mother.     The patient???s home medications have been reviewed.    Allergies: Patient has no known allergies.    -------------------------------------------------- RESULTS -------------------------------------------------    LABS:  Results for orders placed or performed during the hospital encounter of 11/09/20   COVID-19, Rapid    Specimen: Nasopharyngeal Swab   Result Value Ref Range    SARS-CoV-2, NAAT Not Detected Not Detected   Basic Metabolic Panel   Result Value Ref Range    Sodium 140 132 - 146 mmol/L    Potassium 4.2 3.5 - 5.0 mmol/L    Chloride 104 98 - 107 mmol/L    CO2 25 22 - 29 mmol/L    Anion Gap 11 7 - 16 mmol/L    Glucose 118 (H) 74 - 99 mg/dL    BUN 18 6 - 23 mg/dL    CREATININE 1.0 0.7 - 1.2 mg/dL    GFR Non-African American >60 >=60 mL/min/1.73    GFR African American >60      Calcium 8.5 (L) 8.6 - 10.2 mg/dL   CBC with Auto Differential   Result Value Ref Range    WBC 12.6 (H) 4.5 - 11.5 E9/L    RBC 4.11 3.80 - 5.80 E12/L    Hemoglobin 11.7 (L) 12.5 - 16.5 g/dL    Hematocrit 48.2 (L) 37.0 - 54.0 %    MCV 88.1 80.0 - 99.9 fL    MCH 28.5 26.0 - 35.0 pg    MCHC 32.3 32.0 - 34.5 %    RDW 15.1 (H) 11.5 - 15.0 fL    Platelets 264 130 - 450 E9/L    MPV 12.0 7.0 - 12.0 fL    Neutrophils % 63.5 43.0 - 80.0 %    Lymphocytes % 27.8 20.0 - 42.0 %    Monocytes % 4.4 2.0 - 12.0 %    Eosinophils % 0.2 0.0 - 6.0 %    Basophils % 0.4 0.0 - 2.0 %    Neutrophils Absolute 8.06 (H) 1.80 - 7.30 E9/L    Lymphocytes Absolute 4.03 (H) 1.50 - 4.00 E9/L    Monocytes Absolute 0.50 0.10 - 0.95 E9/L    Eosinophils Absolute 0.00 (L) 0.05 - 0.50 E9/L    Basophils Absolute 0.00 0.00 - 0.20 E9/L    Atypical Lymphocytes Relative 4.3 (H) 0.0 - 4.0 %    Anisocytosis 1+     Polychromasia 1+     Hypochromia 1+     Poikilocytes 2+     Burr Cells 1+     Ovalocytes 1+    Protime-INR   Result Value Ref Range    Protime 13.3 (H) 9.3 - 12.4 sec    INR 1.2    APTT   Result Value Ref Range    aPTT 27.9 24.5 - 35.1 sec   TEG lab test   Result Value Ref Range    R (Reaction Time) 2.8 (L) 5.0 - 10.0 min    K (Clotting Time) 0.9 (L) 1.0 - 3.0 min    Angle (Clot Strength) 76.8 (H) 59.0 - 74.0 degree  MA (Max Amplitude) 74.2 (H) 50.0 - 70.0 mm    G-TEG 14.4 (H) 4.5 - 11.0 K d/sc    EPL-TEG 0.3 0.0 - 15.0 %    LY30 (Fibrinolysis) 0.3 0.0 - 8.0 %   SPECIMEN REJECTION   Result Value Ref Range    Rejected Test trp5     Reason for Rejection see below    Troponin   Result Value Ref Range    Troponin, High Sensitivity 39 (H) 0 - 11 ng/L   CBC with Auto Differential   Result Value Ref Range    WBC 12.9 (H) 4.5 - 11.5 E9/L    RBC 4.43 3.80 - 5.80 E12/L    Hemoglobin 12.7 12.5 - 16.5 g/dL    Hematocrit 50.0 93.8 - 54.0 %    MCV 87.8 80.0 - 99.9 fL    MCH 28.7 26.0 - 35.0 pg    MCHC 32.6 32.0 - 34.5 %    RDW 14.9 11.5 - 15.0 fL    Platelets 265 130  - 450 E9/L    MPV 11.5 7.0 - 12.0 fL    Neutrophils % 64.8 43.0 - 80.0 %    Immature Granulocytes % 0.5 0.0 - 5.0 %    Lymphocytes % 24.8 20.0 - 42.0 %    Monocytes % 9.4 2.0 - 12.0 %    Eosinophils % 0.1 0.0 - 6.0 %    Basophils % 0.4 0.0 - 2.0 %    Neutrophils Absolute 8.39 (H) 1.80 - 7.30 E9/L    Immature Granulocytes # 0.06 E9/L    Lymphocytes Absolute 3.21 1.50 - 4.00 E9/L    Monocytes Absolute 1.22 (H) 0.10 - 0.95 E9/L    Eosinophils Absolute 0.01 (L) 0.05 - 0.50 E9/L    Basophils Absolute 0.05 0.00 - 0.20 E9/L   Comprehensive Metabolic Panel w/ Reflex to MG   Result Value Ref Range    Sodium 137 132 - 146 mmol/L    Potassium reflex Magnesium 4.5 3.5 - 5.0 mmol/L    Chloride 103 98 - 107 mmol/L    CO2 23 22 - 29 mmol/L    Anion Gap 11 7 - 16 mmol/L    Glucose 109 (H) 74 - 99 mg/dL    BUN 15 6 - 23 mg/dL    CREATININE 0.9 0.7 - 1.2 mg/dL    GFR Non-African American >60 >=60 mL/min/1.73    GFR African American >60     Calcium 8.4 (L) 8.6 - 10.2 mg/dL    Total Protein 6.4 6.4 - 8.3 g/dL    Albumin 3.3 (L) 3.5 - 5.2 g/dL    Total Bilirubin 0.5 0.0 - 1.2 mg/dL    Alkaline Phosphatase 82 40 - 129 U/L    ALT 12 0 - 40 U/L    AST 20 0 - 39 U/L   Magnesium   Result Value Ref Range    Magnesium 1.9 1.6 - 2.6 mg/dL   Phosphorus   Result Value Ref Range    Phosphorus 2.7 2.5 - 4.5 mg/dL   Calcium, ionized   Result Value Ref Range    Calcium, Ion 1.17 1.15 - 1.33 mmol/L       RADIOLOGY:  CT HEAD WO CONTRAST   Final Result   No significant change in large left acute on chronic subdural hematoma which   demonstrates unchanged mass effect and up to 7 mm midline shift         CT HEAD WO CONTRAST  Final Result   1. Left subdural hemorrhage is predominantly chronic in nature with small   amount of acute hemorrhage.  The prior study from OSH is not available for   comparison.  This measures approximately 2.8 cm in thickness with 6-7 mm of   rightward midline shift.   2. Chronic small vessel ischemic disease and generalized  cerebral volume loss.   3. 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         CT CERVICAL SPINE WO CONTRAST   Final Result   1. No fracture or joint dislocation is seen.   2. Degenerative changes, as described.      RECOMMENDATIONS:   Unavailable         CT CHEST W CONTRAST   Final Result   No acute traumatic injury in chest.      Patchy and nodular infiltrates and pleural effusion in the lung bases   concerning for pneumonia or edema.  Surveillance recommended to see   resolution.      Suggestion of subsegmental pulmonary embolism in the right lower lobe.      There is no acute traumatic injury or acute inflammation in the abdomen   pelvis.      Hydronephrosis in the left kidney with the partially obstructing 7 mm stone   in the left distal ureter/UVJ.      Enlarged prostate gland with slightly thickened urinary bladder.  Please   correlate with urinalysis.      Distended gallbladder.  Ultrasonography is recommended for better assessment.      RECOMMENDATIONS:   Unavailable         CT ABDOMEN PELVIS W IV CONTRAST Additional Contrast? None   Final Result   No acute traumatic injury in chest.      Patchy and nodular infiltrates and pleural effusion in the lung bases   concerning for pneumonia or edema.  Surveillance recommended to see   resolution.      Suggestion of subsegmental pulmonary embolism in the right lower lobe.      There is no acute traumatic injury or acute inflammation in the abdomen   pelvis.      Hydronephrosis in the left kidney with the partially obstructing 7 mm stone   in the left distal ureter/UVJ.      Enlarged prostate gland with slightly thickened urinary bladder.  Please   correlate with urinalysis.      Distended gallbladder.  Ultrasonography is recommended for better assessment.      RECOMMENDATIONS:   Unavailable               ------------------------- NURSING NOTES AND VITALS REVIEWED ---------------------------  Date / Time Roomed:  11/09/2020  2:12 PM  ED Bed Assignment:   3802/3802-A    The nursing notes within the ED encounter and vital signs as below have been reviewed.     Patient Vitals for the past 24 hrs:   BP Temp Temp src Pulse Resp SpO2 Height Weight   11/10/20 1500 (!) 170/80 -- -- 87 24 93 % -- --   11/10/20 1400 (!) 165/74 97.7 ??F (36.5 ??C) Axillary 88 23 92 % -- --   11/10/20 1300 (!) 171/76 -- -- 96 22 93 % -- --   11/10/20 1230 (!) 164/65 -- -- 90 20 -- -- --   11/10/20 1200 (!) 123/57 98.6 ??F (37 ??C) Axillary 85 25 94 % -- --   11/10/20 1159 117/60 -- -- 85 20 96 % -- --  11/10/20 1000 (!) 146/64 100 ??F (37.8 ??C) Axillary 89 23 94 % -- --   11/10/20 0900 (!) 132/55 -- -- 86 18 95 % -- --   11/10/20 0800 (!) 157/66 99.7 ??F (37.6 ??C) Axillary 96 15 98 % -- --   11/10/20 0700 (!) 147/62 -- -- 97 14 -- -- --   11/10/20 0637 (!) 151/79 -- -- -- -- -- -- --   11/10/20 0630 (!) 151/79 -- -- 100 16 -- -- --   11/10/20 0600 123/79 97.9 ??F (36.6 ??C) Axillary 105 21 97 % -- --   11/10/20 0530 138/68 -- -- 103 19 -- -- --   11/10/20 0500 (!) 167/73 -- -- 101 21 98 % -- --   11/10/20 0459 (!) 163/68 -- -- -- -- -- -- --   11/10/20 0430 (!) 163/68 -- -- 101 21 -- -- --   11/10/20 0400 (!) 140/76 97 ??F (36.1 ??C) Axillary 99 (!) 32 99 % -- 212 lb 11.9 oz (96.5 kg)   11/10/20 0330 (!) 153/73 -- -- 101 (!) 34 -- -- --   11/10/20 0300 (!) 138/109 -- -- 99 24 97 % -- --   11/10/20 0230 (!) 142/65 -- -- 96 21 -- -- --   11/10/20 0220 (!) 146/75 -- -- -- -- -- -- --   11/10/20 0200 (!) 146/75 98 ??F (36.7 ??C) Axillary 107 27 96 % -- --   11/10/20 0130 134/66 -- -- 101 19 -- -- --   11/10/20 0107 (!) 153/64 -- -- -- -- -- -- --   11/10/20 0100 (!) 164/148 -- -- 90 24 95 % -- --   11/10/20 0030 (!) 125/95 -- -- 84 21 -- -- --   11/10/20 0014 (!) 177/79 -- -- -- -- -- -- --   11/10/20 0000 (!) 177/79 98 ??F (36.7 ??C) Axillary 88 20 99 % -- --   11/09/20 2330 (!) 152/65 -- -- 70 15 -- -- --   11/09/20 2300 (!) 142/61 -- -- 73 26 98 % -- --   11/09/20 2230 (!) 138/58 -- -- 62 20 97 % -- --    11/09/20 2200 (!) 172/96 98.3 ??F (36.8 ??C) Axillary 82 30 94 % 6\' 2"  (1.88 m) 212 lb 1.3 oz (96.2 kg)   11/09/20 2004 (!) 158/76 -- -- 70 18 95 % -- --       Oxygen Saturation Interpretation: Normal    ------------------------------------------ PROGRESS NOTES ------------------------------------------  Re-evaluation(s):  Patient???s symptoms show no change  Repeat physical examination is not changed    Counseling:  I have spoken with the patient and discussed today???s results, in addition to providing specific details for the plan of care and counseling regarding the diagnosis and prognosis.  Their questions are answered at this time and they are agreeable with the plan of admission.    --------------------------------- ADDITIONAL PROVIDER NOTES ---------------------------------  Consultations:   Spoke with trauma services.  Discussed case.  They will admit the patient.  This patient's ED course included: a personal history and physicial examination and re-evaluation prior to disposition    This patient has remained hemodynamically stable during their ED course.    Diagnosis:  1. Subdural hemorrhage (HCC)        Disposition:  Patient's disposition: Admit to telemetry  Patient's condition is stable.              2005, DO  11/10/20 1551

## 2020-11-09 NOTE — Consults (Signed)
[facility]                  [address] [place], [place] [address]                                  CONSULTATION    PATIENT NAME: Tyler Rhodes, Tyler Rhodes                       DOB:        1928-11-12  MED REC NO:   [id number]                            ROOM:       09  ACCOUNT NO:   [id number]                           ADMIT DATE: 11/09/2020  PROVIDER:     Rupert Stacks, MD    CONSULT DATE:  11/09/2020    REASON FOR CONSULT:  Left-sided subacute on chronic subdural hematoma.    HISTORY OF PRESENT ILLNESS:  The patient is a 87 year old gentleman who  apparently was transferred from [facility] where he was  taken from his nursing home.  He was at Physical Therapy when he  developed right-sided weakness and aphasia.  He did fall and hit his  head.  He does have a history of being on aspirin and Plavix for  coronary artery disease.  At [facility], he had a CT  scan of his head that showed a subacute on chronic subdural hematoma.   He was ultimately transported to [facility] [place],  for further evaluation and management.    PAST MEDICAL HISTORY:  Positive for coronary artery disease,  hyperlipidemia, prostate cancer.    PAST SURGICAL HISTORY:  Positive for cardiac surgery and coronary  angioplasty.    FAMILY HISTORY:  Positive for stroke in his mother.    SOCIAL HISTORY:  Negative for tobacco or alcohol use.    HOME MEDICATIONS:  Include aspirin, Plavix, and Zocor.    REVIEW OF SYSTEMS:  HEENT:  Positive for headache, but negative for  double vision and blurry vision.  CARDIOVASCULAR:  Negative for chest  pain, arrhythmia, or palpitations.  RESPIRATORY:  Negative for shortness  of breath, asthma, bronchitis, or pneumonia.  GASTROINTESTINAL:   Negative for heartburn, nausea, vomiting, diarrhea, or constipation.   GENITOURINARY:  Negative for hematuria or dysuria.  HEMATOLOGIC:   Positive for easy bruising.  INFECTIOUS:  Negative for any  recent  infection.  MUSCULOSKELETAL:  Positive for joint stiffness and swelling.  PSYCHIATRIC:  Negative for anxiety or depression.  NEUROLOGIC:  Negative  for seizure or stroke.  ENDOCRINE:  Negative for thyroid disorder or  diabetes.    PHYSICAL EXAMINATION:  VITAL SIGNS:  He is currently afebrile with a T-current of 36.7 degrees  Celsius, pulse 92, blood pressure is 156/66.  GENERAL:  He is resting in bed.  Does not appear to be in any acute  distress.  Appears his stated age.  HEENT:  His head is normocephalic and atraumatic.  Pupils are 3 to 2 mm  and reactive.  He has no drainage out of his eyes, ears, nose, or  throat.  SKIN:  Warm and dry.  MUSCULOSKELETAL:  He  has got good range of motion in his bilateral upper  and lower extremities.  ABDOMEN:  Soft, nontender, nondistended.  RESPIRATORY:  He is not using any accessory muscles of respiration.  NEUROLOGIC:  On rest of his neurologic exam, he is awake, alert, and  oriented to person only.  Disoriented to place and time.  He does have  some expressive aphasia.  Cranial nerves II through XII are intact  bilaterally.  Motor exam reveals 4/5 strength in his right upper  extremity and right lower extremity.  He has 5/5 strength in his left  upper and left lower extremities.  Sensation is grossly intact to light  touch.  Reflexes are 1+ and symmetric.  Toes are going down.    LAB VALUES:  He has a white blood cell count 12.6, hemoglobin 11.7,  hematocrit 36.2, platelet count 264.    ASSESSMENT AND PLAN:  The patient is a 87 year old gentleman with left  subacute on chronic subdural hematoma.  He is neurologically stable.   Recommendation is for him to undergo burr hole drainage of the subdural  hematoma.  I have tried to get a hold of his family; however, the phone  number in the computer is disconnected at this time.  We will try again  tomorrow.        Rupert Stacks, MD    D: 11/09/2020 19:59:02       T: 11/09/2020 20:02:08     [id number]  Job#: [id number]      Doc#: [id number]    CC:

## 2020-11-09 NOTE — H&P (Signed)
TRAUMA HISTORY & PHYSICAL  Surgical Resident/Advance Practice Nurse  11/09/2020  5:31 PM    PRIMARY SURVEY    CHIEF COMPLAINT:  Trauma consult.    Injury occurred morning of arrival. Transfer from [employer]. Patient is from [facility]. He was at physical therapy this morning when he had right sided weakness and aphasia. He fell hit his head, unknown LOC. Patient takes ASA and plavix for hx of CAD. CT head showed a 24 mm acute on chronic SDH with 7 mm shift. Hb 11.7.     Spoke with daughter Harriett Sine who stated that patient was initially independent, but had a fall in January where he sustained multiple rib fractures. After the fall, he has had waxing and waning mentation. Unsure if he had a subdural bleed at that time Chart from [place] showed that patient was [facility]. Daughter said that she was the one who changed the code status when patient initially fell and state that she was confused about what it meant. States that she would like the patient to be changed to limited with no to intubation.    AIRWAY:   Airway Normal  EMS ETT Absent  Noisy respirations Absent  Retractions: Absent  Vomiting/bleeding: Absent      BREATHING:    Midaxillary breath sound left:  Normal  Midaxillary breath sound right:  Normal    Cough sound intensity:  Unable to assess  FiO2:  Room air    SMI Unable to assess    CIRCULATION:   Femerol pulse intensity: Strong  Palpebral conjunctiva: Pink     Vitals:    11/09/20 1413   BP: (!) 156/66   Pulse: 92   Resp: 18   Temp: 98.1 ??F (36.7 ??C)   SpO2: 99%       Vitals:    11/09/20 1413   BP: (!) 156/66   Pulse: 92   Resp: 18   Temp: 98.1 ??F (36.7 ??C)   SpO2: 99%   Weight: 220 lb (99.8 kg)        FAST EXAM: Deferred    Central Nervous System    GCS Initial 15 minutes   Eye  Motor  Verbal 4 - Opens eyes on own  5 - Pushes away noxious stimulus  3 - Talks, but nonsensical 4 - Opens eyes on own  5 - Pushes away noxious stimulus  3 - Talks, but nonsensical     Baseline GCS 14    Neuromuscular  blockade: No  Pupil size:  Left 3 mm    Right 3 mm  Pupil reaction: Yes    Patient will intermittently follow commands if asked to mirror movement  Wiggles fingers: Left Yes Right Yes  Wiggles toes: Left Yes   Right Yes    Hand grasp:   Left  Present      Right  Present  Plantar flexion: Left  Weak      Right   Weak    Loss of consciousness: unknown    History Obtained From:  Patient & EMS  Private Medical Doctor: Fidel Levy, MD    Pre-exisiting Medical History:  yes    Conditions:  Past Medical History:   Diagnosis Date   ??? Acute MI [facility])     pt had mi 1997   ??? CAD (coronary artery disease)    ??? Cholesterol serum increased    ??? Hyperlipidemia    ??? Prostate cancer (HCC)    ??? Prostate cancer (HCC)  Medications:  Past Medical History:   Diagnosis Date   ??? Acute MI (HCC)     pt had mi 1997   ??? CAD (coronary artery disease)    ??? Cholesterol serum increased    ??? Hyperlipidemia    ??? Prostate cancer (HCC)    ??? Prostate cancer (HCC)        Allergies:   No Known Allergies    Social History:   Tobacco use:  unknown  Alcohol use:  unknown  Illicit drug use:  unknown    Past Surgical History:   Past Surgical History:   Procedure Laterality Date   ??? CARDIAC SURGERY     ??? CORONARY ANGIOPLASTY      pt had a triple by pass 1997   ??? CORONARY ANGIOPLASTY      pt had cat 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       Anticoagulant use: None  Antiplatelet use:   ASA and Plavix    NSAID use in last 72 hours: unknown  Taken PCN in past: unknown  Last food/drink:unknown  Last tetanus: unknown    Family History:   Unable to obtain secondary to patient condition    Complaints:   Unable to assess    Review of systems:  All negative unless otherwise noted.        SECONDARY SURVEY  Head/scalp: Atraumatic    Face: Atraumatic    Eyes/ears/nose: Atraumatic    Pharynx/mouth: Atraumatic    Neck: Atraumatic     Cervical spine tenderness:   Cervical collar not indicated  Pain:  none  ROM:  Not indicated    Chest wall:  Atraumatic    Heart:  Regular  rate & rhythm    Abdomen: Atraumatic. Soft ND  Tenderness:  none    Pelvis: Atraumatic  Tenderness: none    Thoracolumbar spine: Atraumatic  Tenderness:  none    Genitourinary:  Atraumatic.  No blood or urine noted    Rectum: Atraumatic.  No blood noted.    Perineum: Atraumatic.  No blood or urine noted.      Extremities:   Sensory withdrawals from painful stimuli  Motor moving all extremities spontaneously    Distal Pulses  Left arm normal  Right arm normal  Left leg normal  Right leg normal    Capillary refill  Left arm normal  Right arm normal  Left leg normal  Right leg normal    Procedures in ED: none    In the event of Emergency Blood Transfusion:  Due to the critical condition of this patient, I request the immediate release of blood products for emergency transfusion secondary to shock. I understand the increased risks incurred by the lack of complete transfusion testing.      Radiology: CT head, C-spine, chest, abd/pelvis    Consultations:  Neurosurgery    Admission/Diagnosis: acute on chronic SDH    Plan of Treatment:  NSGY recs  Repeat CT head  Keppra BID  Admit to SICU  Hold [facility]  TEG    Plan discussed with Dr. Trenda Moots    at 11/09/2020 on 5:31 PM    Electronically signed by Renee Rival, MD on 11/09/2020 at 5:31 PM

## 2020-11-10 LAB — CBC WITH AUTO DIFFERENTIAL
Atypical Lymphocytes Relative: 4.3 % — ABNORMAL HIGH (ref 0.0–4.0)
Basophils %: 0.4 % (ref 0.0–2.0)
Basophils %: 0.4 % (ref 0.0–2.0)
Basophils Absolute: 0 E9/L (ref 0.00–0.20)
Basophils Absolute: 0.05 E9/L (ref 0.00–0.20)
Eosinophils %: 0.1 % (ref 0.0–6.0)
Eosinophils %: 0.2 % (ref 0.0–6.0)
Eosinophils Absolute: 0 E9/L — ABNORMAL LOW (ref 0.05–0.50)
Eosinophils Absolute: 0.01 E9/L — ABNORMAL LOW (ref 0.05–0.50)
Hematocrit: 36.2 % — ABNORMAL LOW (ref 37.0–54.0)
Hematocrit: 38.9 % (ref 37.0–54.0)
Hemoglobin: 11.7 g/dL — ABNORMAL LOW (ref 12.5–16.5)
Hemoglobin: 12.7 g/dL (ref 12.5–16.5)
Immature Granulocytes #: 0.06 E9/L
Immature Granulocytes %: 0.5 % (ref 0.0–5.0)
Lymphocytes %: 24.8 % (ref 20.0–42.0)
Lymphocytes %: 27.8 % (ref 20.0–42.0)
Lymphocytes Absolute: 3.21 E9/L (ref 1.50–4.00)
Lymphocytes Absolute: 4.03 E9/L — ABNORMAL HIGH (ref 1.50–4.00)
MCH: 28.5 pg (ref 26.0–35.0)
MCH: 28.7 pg (ref 26.0–35.0)
MCHC: 32.3 % (ref 32.0–34.5)
MCHC: 32.6 % (ref 32.0–34.5)
MCV: 87.8 fL (ref 80.0–99.9)
MCV: 88.1 fL (ref 80.0–99.9)
MPV: 11.5 fL (ref 7.0–12.0)
MPV: 12 fL (ref 7.0–12.0)
Monocytes %: 4.4 % (ref 2.0–12.0)
Monocytes %: 9.4 % (ref 2.0–12.0)
Monocytes Absolute: 0.5 E9/L (ref 0.10–0.95)
Monocytes Absolute: 1.22 E9/L — ABNORMAL HIGH (ref 0.10–0.95)
Neutrophils %: 63.5 % (ref 43.0–80.0)
Neutrophils %: 64.8 % (ref 43.0–80.0)
Neutrophils Absolute: 8.06 E9/L — ABNORMAL HIGH (ref 1.80–7.30)
Neutrophils Absolute: 8.39 E9/L — ABNORMAL HIGH (ref 1.80–7.30)
Platelets: 264 E9/L (ref 130–450)
Platelets: 265 E9/L (ref 130–450)
RBC: 4.11 E12/L (ref 3.80–5.80)
RBC: 4.43 E12/L (ref 3.80–5.80)
RDW: 14.9 fL (ref 11.5–15.0)
RDW: 15.1 fL — ABNORMAL HIGH (ref 11.5–15.0)
WBC: 12.6 E9/L — ABNORMAL HIGH (ref 4.5–11.5)
WBC: 12.9 E9/L — ABNORMAL HIGH (ref 4.5–11.5)

## 2020-11-10 LAB — TEG
Angle (Clot Strength): 76.8 degree — ABNORMAL HIGH (ref 59.0–74.0)
EPL-TEG: 0.3 % (ref 0.0–15.0)
G-TEG: 14.4 K d/sc — ABNORMAL HIGH (ref 4.5–11.0)
K (Clotting Time): 0.9 min — ABNORMAL LOW (ref 1.0–3.0)
LY30 (Fibrinolysis): 0.3 % (ref 0.0–8.0)
MA (Max Amplitude): 74.2 mm — ABNORMAL HIGH (ref 50.0–70.0)
R (Reaction Time): 2.8 min — ABNORMAL LOW (ref 5.0–10.0)

## 2020-11-10 LAB — COMPREHENSIVE METABOLIC PANEL W/ REFLEX TO MG FOR LOW K
ALT: 12 U/L (ref 0–40)
AST: 20 U/L (ref 0–39)
Albumin: 3.3 g/dL — ABNORMAL LOW (ref 3.5–5.2)
Alkaline Phosphatase: 82 U/L (ref 40–129)
Anion Gap: 11 mmol/L (ref 7–16)
BUN: 15 mg/dL (ref 6–23)
CO2: 23 mmol/L (ref 22–29)
Calcium: 8.4 mg/dL — ABNORMAL LOW (ref 8.6–10.2)
Chloride: 103 mmol/L (ref 98–107)
Creatinine: 0.9 mg/dL (ref 0.7–1.2)
GFR African American: >60
GFR Non-African American: >60 mL/min/{1.73_m2} (ref >=60)
Glucose: 109 mg/dL — ABNORMAL HIGH (ref 74–99)
Potassium reflex Magnesium: 4.5 mmol/L (ref 3.5–5.0)
Sodium: 137 mmol/L (ref 132–146)
Total Bilirubin: 0.5 mg/dL (ref 0.0–1.2)
Total Protein: 6.4 g/dL (ref 6.4–8.3)

## 2020-11-10 LAB — TROPONIN: Troponin, High Sensitivity: 39 ng/L — ABNORMAL HIGH (ref 0–11)

## 2020-11-10 LAB — PHOSPHORUS: Phosphorus: 2.7 mg/dL (ref 2.5–4.5)

## 2020-11-10 LAB — CALCIUM, IONIZED: Calcium, Ionized: 1.17 mmol/L (ref 1.15–1.33)

## 2020-11-10 LAB — MAGNESIUM: Magnesium: 1.9 mg/dL (ref 1.6–2.6)

## 2020-11-10 LAB — COVID-19, RAPID: SARS-CoV-2, NAAT: NOT DETECTED

## 2020-11-10 MED ORDER — CEFAZOLIN SODIUM 1 G IJ SOLR
1 | INTRAMUSCULAR | Status: AC
Start: 2020-11-10 — End: 2020-11-10

## 2020-11-10 MED ORDER — SODIUM CHLORIDE 0.9 % IV SOLN
0.9 | INTRAVENOUS | Status: DC | PRN
Start: 2020-11-10 — End: 2020-11-10

## 2020-11-10 MED ORDER — SODIUM CHLORIDE 0.9 % IV SOLN
0.9 | INTRAVENOUS | Status: DC
Start: 2020-11-10 — End: 2020-11-12
  Administered 2020-11-10 – 2020-11-11 (×2): via INTRAVENOUS

## 2020-11-10 MED ORDER — NORMAL SALINE FLUSH 0.9 % IV SOLN
0.9 | INTRAVENOUS | Status: DC | PRN
Start: 2020-11-10 — End: 2020-11-18

## 2020-11-10 MED ORDER — ONDANSETRON HCL 4 MG/2ML IJ SOLN
4 MG/2ML | Freq: Four times a day (QID) | INTRAMUSCULAR | Status: DC | PRN
Start: 2020-11-10 — End: 2020-11-23

## 2020-11-10 MED ORDER — FENTANYL CITRATE (PF) 100 MCG/2ML IJ SOLN
100 MCG/2ML | INTRAMUSCULAR | Status: DC | PRN
Start: 2020-11-10 — End: 2020-11-13
  Administered 2020-11-10 – 2020-11-11 (×2): 25 ug via INTRAVENOUS

## 2020-11-10 MED ORDER — NORMAL SALINE FLUSH 0.9 % IV SOLN
0.9 % | Freq: Two times a day (BID) | INTRAVENOUS | Status: DC
Start: 2020-11-10 — End: 2020-11-10
  Administered 2020-11-10: 14:00:00 via INTRAVENOUS

## 2020-11-10 MED ORDER — SODIUM CHLORIDE 0.9 % IV SOLN
0.9 % | INTRAVENOUS | Status: DC | PRN
Start: 2020-11-10 — End: 2020-11-10
  Administered 2020-11-10: 16:00:00 via INTRAVENOUS

## 2020-11-10 MED ORDER — CEFAZOLIN 2000 MG IN 20 ML SWFI IV SYRINGE (PREMIX)
Status: AC
Start: 2020-11-10 — End: 2020-11-10

## 2020-11-10 MED ORDER — POLYETHYLENE GLYCOL 3350 17 G PO PACK
17 g | Freq: Every day | ORAL | Status: DC
Start: 2020-11-10 — End: 2020-11-12

## 2020-11-10 MED ORDER — CEFAZOLIN SODIUM 1 G IJ SOLR
1 g | INTRAMUSCULAR | Status: DC | PRN
Start: 2020-11-10 — End: 2020-11-10
  Administered 2020-11-10: 16:00:00 2000 via INTRAVENOUS

## 2020-11-10 MED ORDER — BACITRACIN ZINC 500 UNIT/GM EX OINT
500 | CUTANEOUS | Status: AC
Start: 2020-11-10 — End: 2020-11-10

## 2020-11-10 MED ORDER — LEVETIRACETAM IN NACL 500 MG/100ML IV SOLN
500 MG/100ML | Freq: Two times a day (BID) | INTRAVENOUS | Status: DC
Start: 2020-11-10 — End: 2020-11-12
  Administered 2020-11-10 – 2020-11-12 (×5): via INTRAVENOUS

## 2020-11-10 MED ORDER — HYDRALAZINE HCL 20 MG/ML IJ SOLN
20 MG/ML | INTRAMUSCULAR | Status: DC | PRN
Start: 2020-11-10 — End: 2020-11-11
  Administered 2020-11-10 (×9): via INTRAVENOUS

## 2020-11-10 MED ORDER — PROPOFOL 500 MG/50ML IV EMUL
500 | INTRAVENOUS | Status: AC
Start: 2020-11-10 — End: 2020-11-10

## 2020-11-10 MED ORDER — PROPOFOL 500 MG/50ML IV EMUL
500 MG/50ML | INTRAVENOUS | Status: DC | PRN
Start: 2020-11-10 — End: 2020-11-10
  Administered 2020-11-10: 16:00:00 50 via INTRAVENOUS

## 2020-11-10 MED ORDER — PHENYLEPHRINE HCL 1 MG/10ML IV SOSY
1 | INTRAVENOUS | Status: DC | PRN
Start: 2020-11-10 — End: 2020-11-10
  Administered 2020-11-10: 16:00:00 100 via INTRAVENOUS
  Administered 2020-11-10 (×2): 200 via INTRAVENOUS

## 2020-11-10 MED ORDER — ISOSORBIDE MONONITRATE ER 60 MG PO TB24
60 MG | Freq: Every day | ORAL | Status: DC
Start: 2020-11-10 — End: 2020-11-23
  Administered 2020-11-16 – 2020-11-22 (×3): via ORAL

## 2020-11-10 MED ORDER — BISACODYL 5 MG PO TBEC
5 MG | Freq: Every day | ORAL | Status: DC | PRN
Start: 2020-11-10 — End: 2020-11-12

## 2020-11-10 MED ORDER — MAGNESIUM SULFATE 2000 MG/50 ML IVPB PREMIX
2 GM/50ML | Freq: Once | INTRAVENOUS | Status: AC
Start: 2020-11-10 — End: 2020-11-10
  Administered 2020-11-10: 20:00:00 via INTRAVENOUS

## 2020-11-10 MED ORDER — LIDOCAINE-EPINEPHRINE 0.5 %-1:200000 IJ SOLN
0.5 | INTRAMUSCULAR | Status: AC
Start: 2020-11-10 — End: 2020-11-10

## 2020-11-10 MED ORDER — ACETAMINOPHEN 325 MG PO TABS
325 MG | Freq: Four times a day (QID) | ORAL | Status: DC
Start: 2020-11-10 — End: 2020-11-12
  Administered 2020-11-11 – 2020-11-12 (×4): via ORAL

## 2020-11-10 MED ORDER — DEXTROSE 5 % IV SOLN
5 % | Freq: Once | INTRAVENOUS | Status: AC
Start: 2020-11-10 — End: 2020-11-10
  Administered 2020-11-10: 14:00:00 via INTRAVENOUS

## 2020-11-10 MED ORDER — LABETALOL HCL 5 MG/ML IV SOLN
5 MG/ML | INTRAVENOUS | Status: DC | PRN
Start: 2020-11-10 — End: 2020-11-12
  Administered 2020-11-10 – 2020-11-11 (×7): via INTRAVENOUS

## 2020-11-10 MED ORDER — STERILE WATER FOR INJECTION (MIXTURES ONLY)
1 g | INTRAMUSCULAR | Status: AC
Start: 2020-11-10 — End: 2020-11-16
  Administered 2020-11-10 – 2020-11-16 (×7): via INTRAVENOUS

## 2020-11-10 MED ORDER — CEFAZOLIN 2000 MG IN 20 ML SWFI IV SYRINGE (PREMIX)
Status: DC
Start: 2020-11-10 — End: 2020-11-10

## 2020-11-10 MED ORDER — FENTANYL CITRATE (PF) 100 MCG/2ML IJ SOLN
100 | INTRAMUSCULAR | Status: AC
Start: 2020-11-10 — End: 2020-11-10

## 2020-11-10 MED ORDER — NORMAL SALINE FLUSH 0.9 % IV SOLN
0.9 % | INTRAVENOUS | Status: DC | PRN
Start: 2020-11-10 — End: 2020-11-10

## 2020-11-10 MED ORDER — ONDANSETRON HCL 4 MG/2ML IJ SOLN
4 MG/2ML | Freq: Four times a day (QID) | INTRAMUSCULAR | Status: DC | PRN
Start: 2020-11-10 — End: 2020-11-10

## 2020-11-10 MED ORDER — ATORVASTATIN CALCIUM 20 MG PO TABS
20 MG | Freq: Every day | ORAL | Status: DC
Start: 2020-11-10 — End: 2020-11-12
  Administered 2020-11-12: 14:00:00 via ORAL

## 2020-11-10 MED ORDER — ONDANSETRON 4 MG PO TBDP
4 MG | Freq: Three times a day (TID) | ORAL | Status: DC | PRN
Start: 2020-11-10 — End: 2020-11-10

## 2020-11-10 MED ORDER — SODIUM CHLORIDE 0.9 % IV SOLN
0.9 % | INTRAVENOUS | Status: DC | PRN
Start: 2020-11-10 — End: 2020-11-12

## 2020-11-10 MED ORDER — NORMAL SALINE FLUSH 0.9 % IV SOLN
0.9 | Freq: Two times a day (BID) | INTRAVENOUS | Status: DC
Start: 2020-11-10 — End: 2020-11-10
  Administered 2020-11-10 (×2): via INTRAVENOUS

## 2020-11-10 MED ORDER — PHENYLEPHRINE HCL 1 MG/10ML IV SOSY
1 | INTRAVENOUS | Status: AC
Start: 2020-11-10 — End: 2020-11-10

## 2020-11-10 MED ORDER — NORMAL SALINE FLUSH 0.9 % IV SOLN
0.9 | Freq: Two times a day (BID) | INTRAVENOUS | Status: DC
Start: 2020-11-10 — End: 2020-11-12
  Administered 2020-11-11 – 2020-11-12 (×3): via INTRAVENOUS

## 2020-11-10 MED ORDER — ACETAMINOPHEN 650 MG RE SUPP
650 MG | RECTAL | Status: DC | PRN
Start: 2020-11-10 — End: 2020-11-13
  Administered 2020-11-10: 21:00:00 via RECTAL

## 2020-11-10 MED ORDER — LIDOCAINE-EPINEPHRINE 0.5 %-1:200000 IJ SOLN
0.5 %-1:200000 | INTRAMUSCULAR | Status: DC | PRN
Start: 2020-11-10 — End: 2020-11-10
  Administered 2020-11-10: 16:00:00 40 via INTRADERMAL

## 2020-11-10 MED ORDER — FENTANYL CITRATE (PF) 100 MCG/2ML IJ SOLN
100 MCG/2ML | INTRAMUSCULAR | Status: DC | PRN
Start: 2020-11-10 — End: 2020-11-10
  Administered 2020-11-10 (×4): 25 via INTRAVENOUS

## 2020-11-10 MED ORDER — SODIUM CHLORIDE 0.9 % IV SOLN
0.9 % | INTRAVENOUS | Status: DC | PRN
Start: 2020-11-10 — End: 2020-11-10

## 2020-11-10 MED ORDER — ONDANSETRON 4 MG PO TBDP
4 MG | Freq: Three times a day (TID) | ORAL | Status: DC | PRN
Start: 2020-11-10 — End: 2020-11-23

## 2020-11-10 MED FILL — CEFAZOLIN SODIUM 1 G IJ SOLR: 1 g | INTRAMUSCULAR | Qty: 1000

## 2020-11-10 MED FILL — CEFTRIAXONE SODIUM 1 G IJ SOLR: 1 g | INTRAMUSCULAR | Qty: 1000

## 2020-11-10 MED FILL — SODIUM PHOSPHATES 45 MMOLE/15ML IV SOLN: 45 MMOLE/15ML | INTRAVENOUS | Qty: 3.33

## 2020-11-10 MED FILL — LEVETIRACETAM IN NACL 500 MG/100ML IV SOLN: 500 MG/100ML | INTRAVENOUS | Qty: 100

## 2020-11-10 MED FILL — HYDRALAZINE HCL 20 MG/ML IJ SOLN: 20 MG/ML | INTRAMUSCULAR | Qty: 1

## 2020-11-10 MED FILL — FENTANYL CITRATE (PF) 100 MCG/2ML IJ SOLN: 100 MCG/2ML | INTRAMUSCULAR | Qty: 2

## 2020-11-10 MED FILL — BD POSIFLUSH 0.9 % IV SOLN: 0.9 % | INTRAVENOUS | Qty: 100

## 2020-11-10 MED FILL — LIDOCAINE-EPINEPHRINE 0.5 %-1:200000 IJ SOLN: 0.5 %-1:200000 | INTRAMUSCULAR | Qty: 50

## 2020-11-10 MED FILL — LABETALOL HCL 5 MG/ML IV SOLN: 5 MG/ML | INTRAVENOUS | Qty: 20

## 2020-11-10 MED FILL — ACEPHEN 650 MG RE SUPP: 650 mg | RECTAL | Qty: 1

## 2020-11-10 MED FILL — MAGNESIUM SULFATE 2 GM/50ML IV SOLN: 2 GM/50ML | INTRAVENOUS | Qty: 50

## 2020-11-10 MED FILL — PROPOFOL 500 MG/50ML IV EMUL: 500 MG/50ML | INTRAVENOUS | Qty: 50

## 2020-11-10 MED FILL — LEVETIRACETAM IN NACL 1000 MG/100ML IV SOLN: 1000 MG/100ML | INTRAVENOUS | Qty: 100

## 2020-11-10 MED FILL — BACITRACIN ZINC 500 UNIT/GM EX OINT: 500 UNIT/GM | CUTANEOUS | Qty: 28

## 2020-11-10 MED FILL — PHENYLEPHRINE HCL 1 MG/10ML IV SOSY: 1 MG/0ML | INTRAVENOUS | Qty: 10

## 2020-11-10 MED FILL — CEFAZOLIN 2000 MG IN 20 ML SWFI IV SYRINGE (PREMIX): Qty: 2000

## 2020-11-10 MED FILL — BD POSIFLUSH 0.9 % IV SOLN: 0.9 % | INTRAVENOUS | Qty: 30

## 2020-11-10 NOTE — Anesthesia Post-Procedure Evaluation (Signed)
Department of Anesthesiology  Postprocedure Note    Patient: Tyler Rhodes  MRN: [id number]  Birthdate: 07/26/1929  Date of evaluation: 11/10/2020  Time:  12:38 PM     Procedure Summary     Date: 11/10/20 Room / Location: [place] [facility]    Anesthesia Start: 4356 Anesthesia Stop: 1200    Procedure: LEFT CRANIOTOMY BURR HOLESFOR SUBDURAL (Left Head) Diagnosis: (/)    Surgeons: Larene Beach, MD Responsible Provider: Ivette Loyal, MD    Anesthesia Type: MAC ASA Status: 3 - Emergent          Anesthesia Type: MAC    Aldrete Phase I: Aldrete Score: 8    Aldrete Phase II:      Last vitals: Reviewed and per EMR flowsheets.       Anesthesia Post Evaluation    Patient location during evaluation: PACU  Patient participation: complete - patient participated  Level of consciousness: awake  Airway patency: patent  Nausea & Vomiting: no nausea and no vomiting  Complications: no  Cardiovascular status: hemodynamically stable and blood pressure returned to baseline  Respiratory status: acceptable  Hydration status: euvolemic

## 2020-11-10 NOTE — Progress Notes (Addendum)
Trauma Tertiary Survey    Admit Date: 11/09/2020  Hospital day 1    CC:  Fall after episode of weakness/aphasia    Alcohol pre-screening:  Men: How many times in the past year have you had 5 or more drinks in a day?  Unable to answer    How much do you drink on a daily basis? Unable to answer    Scheduled Meds:  ??? sodium chloride flush  5-40 mL IntraVENous 2 times per day   ??? ceFAZolin (ANCEF) IVPB  2,000 mg IntraVENous On Call to OR   ??? magnesium sulfate  2,000 mg IntraVENous Once   ??? sodium phosphate IVPB  10 mmol IntraVENous Once   ??? [Held by provider] isosorbide mononitrate  120 mg Oral Daily   ??? atorvastatin  20 mg Oral Daily   ??? sodium chloride flush  5-40 mL IntraVENous 2 times per day   ??? polyethylene glycol  17 g Oral Daily   ??? acetaminophen  650 mg Oral Q6H   ??? levetiracetam  500 mg IntraVENous Q12H     Continuous Infusions:  ??? sodium chloride     ??? sodium chloride     ??? sodium chloride 75 mL/hr at 11/09/20 2222     PRN Meds:sodium chloride flush, sodium chloride, sodium chloride flush, sodium chloride, ondansetron **OR** ondansetron, bisacodyl, hydrALAZINE, labetalol    Subjective:     Patient GCS 13, confused repetitive speech, moves all extremities without following commands. Saturating well without any supplemental oxygen, UOP appropriate.     Objective:     Patient Vitals for the past 8 hrs:   BP Temp Temp src Pulse Resp SpO2 Weight   11/10/20 0637 (!) 151/79 -- -- -- -- -- --   11/10/20 0630 (!) 151/79 -- -- 100 16 -- --   11/10/20 0600 123/79 97.9 ??F (36.6 ??C) Axillary 105 21 97 % --   11/10/20 0530 138/68 -- -- 103 19 -- --   11/10/20 0500 (!) 167/73 -- -- 101 21 98 % --   11/10/20 0459 (!) 163/68 -- -- -- -- -- --   11/10/20 0430 (!) 163/68 -- -- 101 21 -- --   11/10/20 0400 (!) 140/76 97 ??F (36.1 ??C) Axillary 99 (!) 32 99 % 212 lb 11.9 oz (96.5 kg)   11/10/20 0330 (!) 153/73 -- -- 101 (!) 34 -- --   11/10/20 0300 (!) 138/109 -- -- 99 24 97 % --   11/10/20 0230 (!) 142/65 -- -- 96 21 -- --    11/10/20 0220 (!) 146/75 -- -- -- -- -- --   11/10/20 0200 (!) 146/75 98 ??F (36.7 ??C) Axillary 107 27 96 % --   11/10/20 0130 134/66 -- -- 101 19 -- --   11/10/20 0107 (!) 153/64 -- -- -- -- -- --   11/10/20 0100 (!) 164/148 -- -- 90 24 95 % --   11/10/20 0030 (!) 125/95 -- -- 84 21 -- --   11/10/20 0014 (!) 177/79 -- -- -- -- -- --   11/10/20 0000 (!) 177/79 98 ??F (36.7 ??C) Axillary 88 20 99 % --   11/09/20 2330 (!) 152/65 -- -- 70 15 -- --       I/O last 3 completed shifts:  In: 672.5 [I.V.:572.5; IV Piggyback:100]  Out: -   No intake/output data recorded.    Past Medical History:   Diagnosis Date   ??? Acute MI (HCC)     pt had mi  1997   ??? CAD (coronary artery disease)    ??? Cholesterol serum increased    ??? Hyperlipidemia    ??? Prostate cancer (HCC)    ??? Prostate cancer (HCC)        @    Radiology:  CT HEAD WO CONTRAST   Final Result   No significant change in large left acute on chronic subdural hematoma which   demonstrates unchanged mass effect and up to 7 mm midline shift         CT HEAD WO CONTRAST   Final Result   1. Left subdural hemorrhage is predominantly chronic in nature with small   amount of acute hemorrhage.  The prior study from OSH is not available for   comparison.  This measures approximately 2.8 cm in thickness with 6-7 mm of   rightward midline shift.   2. Chronic small vessel ischemic disease and generalized cerebral volume loss.   3. 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         CT CERVICAL SPINE WO CONTRAST   Final Result   1. No fracture or joint dislocation is seen.   2. Degenerative changes, as described.      RECOMMENDATIONS:   Unavailable         CT CHEST W CONTRAST   Final Result   No acute traumatic injury in chest.      Patchy and nodular infiltrates and pleural effusion in the lung bases   concerning for pneumonia or edema.  Surveillance recommended to see   resolution.      Suggestion of subsegmental pulmonary embolism in the right lower lobe.      There is no  acute traumatic injury or acute inflammation in the abdomen   pelvis.      Hydronephrosis in the left kidney with the partially obstructing 7 mm stone   in the left distal ureter/UVJ.      Enlarged prostate gland with slightly thickened urinary bladder.  Please   correlate with urinalysis.      Distended gallbladder.  Ultrasonography is recommended for better assessment.      RECOMMENDATIONS:   Unavailable         CT ABDOMEN PELVIS W IV CONTRAST Additional Contrast? None   Final Result   No acute traumatic injury in chest.      Patchy and nodular infiltrates and pleural effusion in the lung bases   concerning for pneumonia or edema.  Surveillance recommended to see   resolution.      Suggestion of subsegmental pulmonary embolism in the right lower lobe.      There is no acute traumatic injury or acute inflammation in the abdomen   pelvis.      Hydronephrosis in the left kidney with the partially obstructing 7 mm stone   in the left distal ureter/UVJ.      Enlarged prostate gland with slightly thickened urinary bladder.  Please   correlate with urinalysis.      Distended gallbladder.  Ultrasonography is recommended for better assessment.      RECOMMENDATIONS:   Unavailable             PHYSICAL EXAM:     Central Nervous System  Loss of consciousness:  No    GCS:    Eye:  4 - Opens eyes on own  Motor:  5 - Pushes away noxious stimulus  Verbal:  4 - Seems confused, disoriented    Neuromuscular blockade: No  Pupil  size:  Left 4 mm    Right 4 mm  Pupil reaction: Yes    Wiggles fingers: Left Yes Right Yes  Wiggles toes: Left Yes   Right Yes    Hand grasp:   Not following commands  Plantar flexion: Not following commands     PHYSICAL EXAM  General: No apparent distress.   HEENT: Trachea midline, no masses, Pupils equal round reactive  Chest: Respiratory effort was normal with no retractions or use of accessory muscles. On room air  Cardiovascular: Extremities warm, well perfused. Regular rate and rhythm   Abdomen:  Soft and  non distended.  No tenderness, guarding, rebound, or rigidity   Extremities: Moves all 4 extremeties, No pedal edema     Spine:   Spine Tenderness ROM   Cervical 0/10 Normal   Thoracic 0 /10 Normal   Lumbar 0 /10 Normal     Musculoskeletal:    Joint Tenderness Swelling ROM   Right shoulder Absent absent normal   Left shoulder absent absent normal   Right elbow absent absent normal   Left elbow absent absent normal   Right wrist absent absent normal   Left wrist absent absent normal   Right hand grasp Absent absent normal   Left hand grasp absent absent normal   Right hip absent absent normal   Left hip absent absent normal   Right knee Absent absent normal   Left knee absent absent normal   Right ankle absent absent normal   Left ankle absent absent normal   Right foot Absent absent normal   Left foot absent absent normal       CONSULTS: Neurosurgery    PROCEDURES: none    INJURIES:      Principal Problem:    Acute on chronic intracranial subdural hematoma (HCC)  Active Problems:    Single subsegmental pulmonary embolism without acute cor pulmonale (HCC)    Delirium    Slurred speech    Hydronephrosis with urinary obstruction due to ureteral calculus    Pleural effusion  Resolved Problems:    * No resolved hospital problems. *        Assessment/Plan:       ?? Neuro  ?? Acute on chronic SDH  ?? OR with NSG 3/2 for Bur[place]oles  ?? Neuro checks q4hr, Maintain BP < 140  ?? Keppra BID x 7 days  ?? CV  ?? Hypertension  ?? Maintain <140 PRN labetalol and hydralazine   ?? Pulm  ?? Left Pleural Effusion  ?? Monitor, tolerating RA     ?? GI  ?? NPO  ?? Bowel regiman, Zofran PRN    ?? Renal  ?? Nephrolithiasis, hydronephrosis  ?? Urology consult, monitor UOP  ?? Monitor electrolytes, replace as needed    ?? ID: afebrile, no acute issues  ?? Rocephin for prophylaxis in setting of urinary obstruction     ?? Endocrine: no acute issues    ?? MSK: no acute issues, PT/OT    ?? Heme  ?? Subseqmental Pulm. Embolism  ?? IVC filter placement per Vascular surgery  3/3  ?? Holding anticoagulation   ?? Monitor H/H    Bowel regime: Dulcolax, Glycolax  Pain control/Sedation: Tylenol q6hr  DVT prophylaxis: SCDs  GI: NPO, swallow evaluation when able   Glucose protocol: None  Mouth/Eye care: per patient.   Foley: External catheter   Lines: PIV    Code status:    Limited    Patient/Family update:  As available  Disposition:  Continue ICU level care       Electronically signed by Sharlynn Oliphant, MD on 11/10/20 at 7:09 AM EST

## 2020-11-10 NOTE — Care Coordination-Inpatient (Addendum)
Pt was admitted from [facility] skilled unit where bed is being held. Received call from DON, Jaimie at [facility]. She would like it clarified that pt did not fall and hit his head. He did slide to the floor on Feb 21st, but did not his his head. Spoke with Hervey Ard. Plan is to return to [facility] when stable. Plan is for OR today for Burr hole crani  and then IVC filter by vascular surgery in am for subsegmental PE. Will need post op therapy evals, precert and covid within 24 hrs of discharge.

## 2020-11-10 NOTE — Progress Notes (Signed)
Spoke to patient's POA Merideth Abbey on the phone regarding IVC filter placement tomorrow.  Risks, benefits, and description of the procedure discussed with the family member.  Patient's POA elected to move forward with IVC filter  placement tomorrow in the operating room 11/11/20.    Electronically signed by Joseph Berkshire, MD on 11/10/2020 at 4:03 PM

## 2020-11-10 NOTE — Progress Notes (Signed)
Plan for IVC filter placement tomorrow 3/3    Electronically signed by Joseph Berkshire, MD on 11/10/2020 at 8:04 AM

## 2020-11-10 NOTE — Other (Signed)
Pt agitated, attempting to get out of bed, moaning, and pulling off external catheter, and pulling at IVs. Pt is unable to be redirected at this time, unsure of patients baseline, will continue to monitor.

## 2020-11-10 NOTE — Consults (Signed)
Vascular Surgery Consultation Note    Reason for Consult:  eval for IVC filter placement    Chief complaint:  Chief Complaint   Patient presents with   ??? Head Injury     transfer from [place], large SDH  change in mental status        HPI :    This is a 87 y.o. male who is admitted to the hospital for treatment of acute on chronic left sided subdural hemorrhage.     Vascular surgery is consulted for evaluation and treatment of possible IVC filter placement, because he was found to have possible subsegmental PE.  Patient is currently confused with expressive aphasia and says yes to all of my questions. No known peripheral vascular history in the chart.    ROS : unable to obtain 2/2 AMS    Past Medical History:   Diagnosis Date   ??? Acute MI (HCC)     pt had mi 1997   ??? CAD (coronary artery disease)    ??? Cholesterol serum increased    ??? Hyperlipidemia    ??? Prostate cancer (HCC)    ??? Prostate cancer [facility])         Past Surgical History:   Procedure Laterality Date   ??? CARDIAC SURGERY     ??? CORONARY ANGIOPLASTY      pt had a triple by pass 1997   ??? CORONARY ANGIOPLASTY      pt had cat 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       Current Medications:   ??? sodium chloride     ??? sodium chloride 75 mL/hr at 11/09/20 2222      sodium chloride flush, sodium chloride, ondansetron **OR** ondansetron, bisacodyl, hydrALAZINE, labetalol   ??? [Held by provider] isosorbide mononitrate  120 mg Oral Daily   ??? atorvastatin  20 mg Oral Daily   ??? sodium chloride flush  5-40 mL IntraVENous 2 times per day   ??? polyethylene glycol  17 g Oral Daily   ??? acetaminophen  650 mg Oral Q6H   ??? levetiracetam  500 mg IntraVENous Q12H        Home Medications:  Prior to Admission medications    Medication Sig Start Date End Date Taking? Authorizing Provider   bisacodyl (DULCOLAX) 10 MG suppository Place 10 mg rectally daily as needed for Constipation   Yes Historical Provider, MD   isosorbide mononitrate (IMDUR) 120 MG extended release tablet Take 120 mg by mouth daily    Yes Historical Provider, MD   magnesium hydroxide (MILK OF MAGNESIA) 400 MG/5ML suspension Take 30 mLs by mouth daily as needed for Constipation   Yes Historical Provider, MD   pantoprazole (PROTONIX) 40 MG tablet Take 40 mg by mouth daily   Yes Historical Provider, MD   acetaminophen (TYLENOL) 325 MG tablet Take 2 tablets by mouth every 4 hours as needed for Pain or Fever 05/11/17  Yes Mourad L Rostom, MD   aspirin 81 MG chewable tablet Take 81 mg by mouth daily    Yes Historical Provider, MD   clopidogrel (PLAVIX) 75 MG tablet Take 75 mg by mouth daily   Yes Historical Provider, MD   simvastatin (ZOCOR) 40 MG tablet Take 40 mg by mouth nightly    Yes Historical Provider, MD       Allergies:  Patient has no known allergies.    Social History     Socioeconomic History   ??? Marital status: Married     Spouse  name: Not on file   ??? Number of children: Not on file   ??? Years of education: Not on file   ??? Highest education level: Not on file   Occupational History   ??? Not on file   Tobacco Use   ??? Smoking status: Former Smoker     Packs/day: 1.00     Years: 10.00     Pack years: 10.00   ??? Smokeless tobacco: Former Neurosurgeon     Quit date: 05/30/1996   Substance and Sexual Activity   ??? Alcohol use: No   ??? Drug use: No   ??? Sexual activity: Not on file   Other Topics Concern   ??? Not on file   Social History Narrative   ??? Not on file     Social Determinants of Health     Financial Resource Strain:    ??? Difficulty of Paying Living Expenses: Not on file   Food Insecurity:    ??? Worried About Running Out of Food in the Last Year: Not on file   ??? Ran Out of Food in the Last Year: Not on file   Transportation Needs:    ??? Lack of Transportation (Medical): Not on file   ??? Lack of Transportation (Non-Medical): Not on file   Physical Activity:    ??? Days of Exercise per Week: Not on file   ??? Minutes of Exercise per Session: Not on file   Stress:    ??? Feeling of Stress : Not on file   Social Connections:    ??? Frequency of Communication with  Friends and Family: Not on file   ??? Frequency of Social Gatherings with Friends and Family: Not on file   ??? Attends Religious Services: Not on file   ??? Active Member of Clubs or Organizations: Not on file   ??? Attends Banker Meetings: Not on file   ??? Marital Status: Not on file   Intimate Partner Violence:    ??? Fear of Current or Ex-Partner: Not on file   ??? Emotionally Abused: Not on file   ??? Physically Abused: Not on file   ??? Sexually Abused: Not on file   Housing Stability:    ??? Unable to Pay for Housing in the Last Year: Not on file   ??? Number of Places Lived in the Last Year: Not on file   ??? Unstable Housing in the Last Year: Not on file        Family History   Problem Relation Age of Onset   ??? Stroke Mother    ??? Heart Disease Father        PHYSICAL EXAM:    BP (!) 146/75    Pulse 107    Temp 98 ??F (36.7 ??C) (Axillary)    Resp 27    Ht 6\' 2"  (1.88 m)    Wt 212 lb 1.3 oz (96.2 kg)    SpO2 99%    BMI 27.23 kg/m??   CONSTITUTIONAL:  awake, alert, constantly fidgeting, and appears stated age  NEURO:  Moves all extremities, strong and symmetric, pupils equal  EYES: anicteric, lids and lashes normal   HENT:  normocepalic, mucous membranes dry  NECK:  supple, symmetrical, trachea midline  LUNGS:  Nonlabored on room air, occasional cough  CARDIOVASCULAR:  Mildly tachy rate, regular rhythm  ABDOMEN:  soft, non-distended, non-tender  SKIN:  no bruising or bleeding  EXTREMITIES:   R UE Swelling absent   Incisions absent  5/5 Strength  L UE Swelling absent   Incisions absent         5/5 Strength  R LE Edema absent    Incisions absent    Varicose veins absent    Wounds absent   5/5 Strength  L LE Edema absent    Incisions absent    Varicose veins absent    Wounds absent   5/5 Strength  R brachial 2 L brachial 2   R radial 2 L radial 2   R femoral 2 L femoral 2   R popliteal  L popliteal    R posterior tibial  L posterior tibial    R dorsalis pedis 2 L dorsalis pedis Patient too squirmy   Above definitions:  2 = palpable, 1 = trace, 0 = nonpalpable    LABS:    Lab Results   Component Value Date    WBC 12.6 (H) 11/09/2020    HGB 11.7 (L) 11/09/2020    HCT 36.2 (L) 11/09/2020    PLT 264 11/09/2020    PROTIME 13.3 (H) 11/09/2020    INR 1.2 11/09/2020    K 4.2 11/09/2020    BUN 18 11/09/2020    CREATININE 1.0 11/09/2020       RADIOLOGY:  CT head 11/09/20: acute on chronic SDH  CT chest with IV 11/09/20: "Suggestion of subsegmental pulmonary embolism in the right lower lobe."    Assesment/Plan  Possible subsegmental PE with contraindication to anticoagulation (subdural)  - will discuss possible IVC filter placement    Electronically signed by Katharina Caper, MD on 11/10/2020 at 3:47 AM     Pt seen and examined    PE noted on imaging    Unable to anticoagulate    Plan for filter per request of icu     I reviewed the procedure with the patient and family (daughter on the phone)as available.  I discussed the procedure, risks, benefits, complications, and alternatives of the procedure.  They understand and consent.  All questions were answered    Louie Boston, MD

## 2020-11-10 NOTE — Consults (Signed)
Palliative Care Department  Palliative Care Initial Consult  Provider: Hulen Luster, APRN - CNP  [phone]    Hospital Day: 2  Date of Initial Consult: 11/09/20  Referring Provider: Dr. Orma Render  Palliative Medicine was consulted for assistance with: Geriatric Trauma    Chief Complaint: Tyler Rhodes is a 87 y.o. male with chief complaint of fall, ICH    HPI:   Tyler Rhodes is a 87 y.o. male with significant medical history of CAD, who was admitted on 11/09/2020 after he reportedly developed right-sided weakness and aphasia, followed by a fall during which he reportedly hit his head.  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  Seen by neurosurgery underwent bur hole craniotomy with subdural drain placement.  Incidentally was found to have possible subsegmental PE as well as left hydronephrosis with a left distal ureteral calculi.  He is being followed by vascular and urology and is planned to undergo IVC filter placement and cystoscopy.  Palliative service has been consulted as per the geriatric trauma protocol.    ASSESSMENT/PLAN:     Pertinent Hospital Diagnoses:  Current medical issues leading to Palliative Medicine involvement include   Active Hospital Problems    Diagnosis Date Noted   ??? Acute on chronic intracranial subdural hematoma (HCC) [I62.01, I62.03] 11/09/2020   ??? Single subsegmental pulmonary embolism without acute cor pulmonale (HCC) [I26.93] 11/09/2020   ??? Delirium [R41.0] 11/09/2020   ??? Slurred speech [R47.81] 11/09/2020   ??? Hydronephrosis with urinary obstruction due to ureteral calculus [N13.2] 11/09/2020   ??? Pleural effusion [J90] 11/09/2020     Palliative Care Encounter / Counseling Regarding Goals of Care:  Please see detailed goals of care discussion as below.  ??? At this time, Tyler Rhodes, Does Not have capacity for medical decision-making.  Capacity is time  limited and situation/question specific.  ??? During encounter patient's daughter Tyler Rhodes was surrogate medical decision-maker  ??? Outcome of goals of care meeting: continue current management and to be determined  ??? Code status: Limited DO NOT INTUBATE, yes for chest compressions, defibrillation, resuscitative medications   o The daughter states that he was a DNR CC at the skilled nursing facility at the recommendation of the provider there  o She is open to discussing CODE STATUS further following his procedures tomorrow  o For this hospitalization I would recommend DNR CCA status DO NOT INTUBATE status, the daughter will consider these recommendations  ??? Advanced Directives: Living Will and HC-POA  ??? Surrogate/Legal NOK:   Tyler Rhodes ([id number]) is a patient daughter  ??? Will follow and continued to discuss goals of care pending clinical progression.    Referrals: none today    SUBJECTIVE:   Events/Discussions:  Changes seen at the bedside, no family present.  He recently turned from his bur hole craniotomy, he is alert, however is confused and restless at this time.  I did speak with the patient's daughter by telephone, providing support, we discussed goals of care and CODE STATUS.  She states that he was a DNR CC at the skilled nursing facility, and this was done at the recommendation of the provider at the skilled nursing facility.  She states that she knows that he would not wish to be placed on life support, and thus he is a DO NOT INTUBATE.  She is open to further conversation regarding goals of care  and CODE STATUS pending his further clinical progression, would like to discuss this further after his procedures tomorrow.    Past Medical History:   Diagnosis Date   ??? Acute MI (HCC)     pt had mi 1997   ??? CAD (coronary artery disease)    ??? Cholesterol serum increased    ??? Hyperlipidemia    ??? Prostate cancer (HCC)    ??? Prostate cancer [facility])        Past Surgical History:   Procedure Laterality Date   ??? CARDIAC SURGERY      ??? CORONARY ANGIOPLASTY      pt had a triple by pass 1997   ??? CORONARY ANGIOPLASTY      pt had cat 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       Family History   Problem Relation Age of Onset   ??? Stroke Mother    ??? Heart Disease Father        No Known Allergies    ROS: UNLESS STATED ABOVE PATIENT DENIES:  CONSTITUTIONAL:  fever, chill, rigors, nausea, vomiting, fatigue.  HEENT: blurry vision, double vision, hearing problem, tinnitus, hoarseness, dysphagia, odynophagia  RESPIRATORY: cough, shortness of breath, sputum expectoration.  CARDIOVASCULAR:  Chest pain/pressure, palpitation, syncope, irregular beats  GASTROINTESTINAL:  abdominal or rectal pain, diarrhea, constipation, .  GENITOURINARY:  Burning, frequency, urgency, incontinence, discharge  INTEGUMENTARY: rash, wound, pruritis  HEMATOLOGIC/LYMPHATIC:  Swelling, sores, gum bleeding, easy bruising, pica.  MUSCULOSKELETAL:  pain, edema, joint swelling or redness  NEUROLOGICAL:  light headed, dizziness, loss of consciousness, weakness, change in memory, seizures, tremors    OBJECTIVE:   Prognosis: unknown    Physical Exam:  BP (!) 154/82    Pulse 82    Temp 98.6 ??F (37 ??C) (Axillary)    Resp 24    Ht 6\' 2"  (1.88 m)    Wt 212 lb 11.9 oz (96.5 kg)    SpO2 92%    BMI 27.31 kg/m??     Gen: Elderly, restless  HEENT:  Normocephalic, conjunctiva pink, no drainage, mucosa moist  Neck:  Supple  Lungs:  CTA bilaterally  Heart: RRR  Abd:  Soft, non tender, non distended, BS+  M/S/Ext:  Moving all extremities, no edema, pulses present  Skin:  Warm and dry  Neuro:  PERRL, Alert, confused, restless    Objective data reviewed: labs, images, records, medication use, vitals and chart    Time/Communication:  Greater than 50% of time spent, total 30 minutes in counseling and coordination of care at the bedside regarding goals of care and see above.    , APRN - CNP  Palliative Medicine    Patient and the plan of care discussed with the other IDT members of Palliative Care Team, and  with consultants, Primary Attending, patient, family and floor nurse, as appropriate and available.    Thank you for allowing Palliative Medicine to participate in the care of ANAY RATHE.    Note: This report was completed using computerize voiced recognition software.  Every effort has been made to ensure accuracy; however, inadvertent computerized transcription errors may be present.

## 2020-11-10 NOTE — Procedures (Signed)
Pt was moaning and even after pain med's given pt did not seem to have much relief.  Pt is not voiding bladder scan done and pt had 910.  Pt is going to surgery and must lay flat from surgery.  Cath inserted and pt voided 1000.  Sample sent that was ordered for this am.  Pt is comfortable and fell asleep.  Rn did not have any problem inserting catheter and urine very cloudy and thick

## 2020-11-10 NOTE — Progress Notes (Signed)
OCCUPATIONAL THERAPY    Date:11/10/2020  Patient Name: Tyler Rhodes  MRN: [id number]  DOB: 04-Oct-1928  Room: 3802/3802-A              Chart reviewed. Pt on hold; scheduled for surgery today per RN.  Will re-attempt at later time. Thank you for consult.    Charlie Pitter, OTR/L [phone]

## 2020-11-10 NOTE — Progress Notes (Signed)
Urology and Palliative Care were both consulted @ 08:24 am.

## 2020-11-10 NOTE — Brief Op Note (Signed)
Brief Postoperative Note      Patient: Tyler Rhodes  Date of Birth: July 01, 1929  MRN: [id number]    Date of Procedure: 11/10/2020    Pre-Op Diagnosis: Left subdural hematoma    Post-Op Diagnosis: Same       Procedure(s):  LEFT CRANIOTOMY BURR HOLESFOR SUBDURAL    Surgeon(s):  Rupert Stacks, MD    Assistant:  Physician Assistant: Duffy Bruce, PA    Anesthesia: Monitor Anesthesia Care    Estimated Blood Loss (mL): less than 100     Complications: None    Specimens:   * No specimens in log *    Implants:  Implant Name Type Inv. Item Serial No. Manufacturer Lot No. LRB No. Used Action   COVER BUR H DIA14MM CRANIOMAXILLOFACIAL PLT LO PROF W/ TAB - [id number]  COVER BUR H DIA14MM CRANIOMAXILLOFACIAL PLT LO PROF W/ TAB  STRYKER CRANIOMAXILLOFACIAL-WD  Left 1 Implanted   SCREW CRANIOMAXILLOFACIAL UNIVERSAL NEURO UN3 SLFTP 1.5X4MM - [id number]  SCREW CRANIOMAXILLOFACIAL UNIVERSAL NEURO UN3 SLFTP 1.5X4MM  STRYKER CRANIOMAXILLOFACIAL-WD  Left 9 Implanted   COVER BUR H L14MM W/ SHUNT - [id number]  COVER BUR H L14MM W/ SHUNT  [employer]WD  Left 1 Implanted         Drains:   External Urinary Catheter (Active)   Catheter changed  Yes 11/10/20 0630   Urine Color Yellow 11/10/20 0630   Suction 40 mmgHg continuous 11/10/20 0630   Placement Replaced 11/10/20 0630       Ventricular Device Ventricular drainage catheter Left (Active)       Findings: see dictated op note    Electronically signed by Halina Andreas, MD on 11/10/2020 at 12:02 PM

## 2020-11-10 NOTE — Progress Notes (Addendum)
[facility]  SURGICAL INTENSIVE CARE UNIT (SICU)  ATTENDING PHYSICIAN CRITICAL CARE PROGRESS NOTE     I have examined the patient, reviewed the record, and discussed the case with the APN/ resident. Please refer to the APN/ resident's note. I agree with the assessment and plan. I have reviewed all relevant labs and imaging data. The following summarizes my clinical findings and independent assessment.    CC:  Critical care management after fall    Hospital Course/Overnight Events:  3/1: Patient presented after sustaining a fall at home with increasing AMS, per history fall resulted from temporary weakness on R side. He was found to have acute on chronic SDH with 64mm shift and was admitted to ICU.   3/2: Imagining revealed hydronephrosis 2/2 ureteral obstruction, urology consulted. [place] with neurosurgery today. Vascular surgery consulted for IVC filter placement for subsegmental PE    Pt with mumbling speech.    Eyes to voice  Does not follow commands  Hrt:  Regular rate/rhythm; no murmur  Lungs:  Fairly clear bilaterally  Abd:  Soft; BS active; NT/ND  Skin:  Warm/dry  Ext:  Moving all 4 ext; no edema    Labs/films personally reviewed--subacute SDH with shift; left hydronephrosis from stone obstruction; bilateral renal cysts; left pleural effusion    Patient Active Problem List    Diagnosis Date Noted   ??? Acute on chronic intracranial subdural hematoma (HCC) 11/09/2020   ??? Single subsegmental pulmonary embolism without acute cor pulmonale (HCC) 11/09/2020   ??? Delirium 11/09/2020   ??? Slurred speech 11/09/2020   ??? Hydronephrosis with urinary obstruction due to ureteral calculus 11/09/2020   ??? Pleural effusion 11/09/2020   ??? Anemia 05/10/2017   ??? Sepsis (HCC) 05/09/2017   ??? Arthritis of knee, right 05/09/2017   ??? Elevated serum cholesterol    ??? Hyperlipidemia    ??? Acute MI (HCC)        S/p fall  Acute on chronic SDH--s/p burr hole crani  Hypoalbuminemia/moderate protein calorie  malnutrition--check swallow eval and start po as able or place [place] and start TF  Subsegmental PE--for IVC filter tomorrow  Left hydronephrosis from obstructing stone--for cysto with stent placement tomorrow  PT/OT evals  DVT risk--PCDs    Pt is at risk for neurologic/respiratory/metabolic deterioration and requires ICU care    Iven Finn, MD, FACS  11/10/2020  1:41 PM      Critical care time exclusive of teaching and procedures = 39 minutes

## 2020-11-10 NOTE — Progress Notes (Signed)
Physical Therapy  Physical Therapy Attempt    Name: Tyler Rhodes  DOB: 1928/12/05  MRN: [id number]      Date of Service: 11/10/2020  Chart reviewed.  Spoke with RN who reported pt is scheduled for surgery with neurosurgery today.  Will re-attempt as able.    Levada Dy, PT, DPT  [phone]

## 2020-11-10 NOTE — Discharge Summary (Signed)
Physician Discharge Summary     Patient ID:  Tyler Rhodes  [id number]  87 y.o.  [birth year]    Admit date: 11/09/2020    Discharge date and time: 11/23/2020  1:58 PM     Admitting Physician: Lorene Dy, MD     Admission Diagnoses: Acute on chronic intracranial subdural hematoma (HCC) [I62.01, I62.03]    Discharge Diagnoses: Principal Problem:    Acute on chronic intracranial subdural hematoma (HCC)  Active Problems:    Single subsegmental pulmonary embolism without acute cor pulmonale (HCC)    Delirium    Slurred speech    Hydronephrosis with urinary obstruction due to ureteral calculus    Pleural effusion    Subdural hemorrhage (HCC)    Palliative care by specialist  Resolved Problems:    * No resolved hospital problems. *      Admission Condition: stable    Discharged Condition: stable    Indication for Admission: Acute on chronic SDH    Hospital Course/Procedures/Operation/treatments:   3/1: Patient presented after sustaining a fall at home with increasing AMS, per history fall resulted from temporary weakness on R side. He was found to have acute on chronic SDH with 7mm shift and was admitted to ICU.   3/2: Imagining revealed hydronephrosis 2/2 ureteral obstruction, urology consulted. [place] with neurosurgery today. Vascular surgery consulted for IVC filter placement for subsegmental PE   3/3 OR with urology for cystoscopy and left ureteral stent placement and vascular surgery for IVC filter placement. SLP determined patient is to remain NPO.  Place PICC and discuss nutrition   3/4: Patient transferred out of SICU  3/5: 7/24 on PT yesterday. No acute events overnight.   3/6: Pulled out corpak overnight, will have speech reevaluate. No other issues. Foley remains in, Abx remain for UTI.  3/7: speech to reevaluate today, high likelhood will need another corpak placed versus a PEG  3/8: Evaluated by PT OT yesterday scored 9 out of 24, planning for [place] [facility] -plan for pre-CERT initiation, no acute  events overnight  3/9: same this morning resting comfortably; precert to [facility] - will need COVID test 24 hours prior to leaving  3/10 arouses follows simple commands, denies any pain this morning  3/15: No acute events overnight. Awaiting blood cultures and precert.    Consults:   IP CONSULT TO TRAUMA SURGERY  IP CONSULT TO NEUROSURGERY  IP CONSULT TO PALLIATIVE CARE  IP CONSULT TO VASCULAR SURGERY  IP CONSULT TO UROLOGY  IP CONSULT TO IV TEAM  IP CONSULT TO IV TEAM  IP CONSULT TO DIETITIAN  IP CONSULT TO PHARMACY  IP CONSULT TO HOSPICE    Significant Diagnostic Studies:   CT HEAD WO CONTRAST    Result Date: 11/09/2020  EXAMINATION: CT OF THE HEAD WITHOUT CONTRAST  11/09/2020 9:13 pm TECHNIQUE: CT of the head was performed without the administration of intravenous contrast. Dose modulation, iterative reconstruction, and/or weight based adjustment of the mA/kV was utilized to reduce the radiation dose to as low as reasonably achievable. COMPARISON: 11/09/2020 at 17:13 HISTORY: ORDERING SYSTEM PROVIDED HISTORY: follow up prior hemorrhage at 9pm (4 hrs after prior CT) TECHNOLOGIST PROVIDED HISTORY: Has a "code stroke" or "stroke alert" been called?->No Reason for exam:->follow up prior hemorrhage at 9pm (4 hrs after prior CT) What reading provider will be dictating this exam?->CRC FINDINGS: There is no significant change in the large acute on chronic left subdural hematoma.  This measures about 2.3 cm thickness.  There  is mass effect on the left cerebral convexity.  There is up to 7 mm midline shift.  There is no new or enlarging hemorrhage seen.  Chronic small vessel ischemic disease is unchanged.  Sinus mucosal thickening is unchanged.     No significant change in large left acute on chronic subdural hematoma which demonstrates unchanged mass effect and up to 7 mm midline shift     CT HEAD WO CONTRAST    Result Date: 11/09/2020  EXAMINATION: CT OF THE HEAD WITHOUT CONTRAST  11/09/2020 5:06 pm TECHNIQUE: CT of the head was  performed without the administration of intravenous contrast. Dose modulation, iterative reconstruction, and/or weight based adjustment of the mA/kV was utilized to reduce the radiation dose to as low as reasonably achievable. COMPARISON: CT head 05/09/2017 HISTORY: ORDERING SYSTEM PROVIDED HISTORY: fall, Subdural from [facility] TECHNOLOGIST PROVIDED HISTORY: Has a "code stroke" or "stroke alert" been called?->No Reason for exam:->fall, Subdural from OSH Decision Support Exception - unselect if not a suspected or confirmed emergency medical condition->Emergency Medical Condition (MA) What reading provider will be dictating this exam?->CRC FINDINGS: There is a left subdural hemorrhage which is predominantly chronic in nature. There is streaky hyperdense, acute hemorrhage within the collection.  This measures a proximally 2.8 cm in thickness.  There is approximately 6-7 mm of rightward midline shift.  There is effacement of the left cerebral sulci. There is mild effacement of the left lateral ventricle.  There is patchy hypoattenuation within the white matter of the bilateral cerebral hemispheres.  There is hypodensity at the head of the right caudate nucleus. There is hypodensity within the right cerebellar hemisphere.  There is encephalomalacia within the right temporal lobe.  There is enlargement of the right cerebral sulci.  There is no evidence of acute cortical ischemia.  The calvarium is intact.  There is patchy mucosal thickening within the paranasal sinuses, most pronounced within the left maxillary sinus.  There is bony remodeling of the walls of the bilateral maxillary sinuses.  There is evidence of prior sinus surgery.  The mastoid air cells are clear.  There are bilateral lens replacements.     1. Left subdural hemorrhage is predominantly chronic in nature with small amount of acute hemorrhage.  The prior study from OSH is not available for comparison.  This measures approximately 2.8 cm in thickness with 6-7  mm of rightward midline shift. 2. Chronic small vessel ischemic disease and generalized cerebral volume loss. 3. 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     CT CHEST W CONTRAST    Result Date: 11/09/2020  EXAMINATION: CT OF THE CHEST WITH CONTRAST; CT OF THE ABDOMEN AND PELVIS WITH CONTRAST 11/09/2020 5:06 pm TECHNIQUE: CT of the chest was performed with the administration of intravenous contrast. Multiplanar reformatted images are provided for review. Dose modulation, iterative reconstruction, and/or weight based adjustment of the mA/kV was utilized to reduce the radiation dose to as low as reasonably achievable.; CT of the abdomen and pelvis was performed with the administration of intravenous contrast. Multiplanar reformatted images are provided for review. Dose modulation, iterative reconstruction, and/or weight based adjustment of the mA/kV was utilized to reduce the radiation dose to as low as reasonably achievable. COMPARISON: None HISTORY: ORDERING SYSTEM PROVIDED HISTORY: fall , transfer from outside facility TECHNOLOGIST PROVIDED HISTORY: Reason for exam:->fall , transfer from outside facility Decision Support Exception - unselect if not a suspected or confirmed emergency medical condition->Emergency Medical Condition (MA) What reading provider will be dictating this exam?->CRC FINDINGS:  CT chest. There is borderline cardiac size with diffuse coronary artery calcification. Moderately enlarged mediastinal lymph nodes are present.  Trachea and major bronchi are patent.  There is suggestion of a subsegmental pulmonary embolism in the right lower lobe.  Patchy and nodular infiltrates and pleural effusions are identified in the lower lobes bilaterally with minimal atelectasis in the lingula likely CHF/edema and or pneumonia.  Surveillance recommended to see resolution and exclude developing malignancy in the left lung base.  Degenerative changes are identified in the thoracic spine with diffuse  degenerative changes of the shoulders with superior subluxation of the humeral head.  Healing multiple right rib fractures are noted.  There is gynecomastia. CT abdomen and pelvis. Comparison 01/06/2011. Findings The liver is of normal architecture.  The gallbladder is distended.  Consider ultrasonography.  Spleen, pancreas, and adrenals are normal.  Multiple cystic lesions are identified in the kidneys with somewhat atrophic left kidney. The cystic lesions measure up to 6.6 x 6 cm.  There is hydronephrosis in the left kidney.  There is vascular calcification aorta and degenerative changes in the thoracolumbar spine. Pelvis.  The bladder is partially distended with wall thickening.  The prostate gland is enlarged measuring 5.4 x 5.6 cm with mass effect on the bladder floor.  A 7 mm stone is identified in the distal left ureter which may be causing partial obstruction in the left kidney.  There is constipation.  The appendix is normal.     No acute traumatic injury in chest. Patchy and nodular infiltrates and pleural effusion in the lung bases concerning for pneumonia or edema.  Surveillance recommended to see resolution. Suggestion of subsegmental pulmonary embolism in the right lower lobe. There is no acute traumatic injury or acute inflammation in the abdomen pelvis. Hydronephrosis in the left kidney with the partially obstructing 7 mm stone in the left distal ureter/UVJ. Enlarged prostate gland with slightly thickened urinary bladder.  Please correlate with urinalysis. Distended gallbladder.  Ultrasonography is recommended for better assessment. RECOMMENDATIONS: Unavailable     CT CERVICAL SPINE WO CONTRAST    Result Date: 11/09/2020  EXAMINATION: CT OF THE CERVICAL SPINE WITHOUT CONTRAST 11/09/2020 5:06 pm TECHNIQUE: CT of the cervical spine was performed without the administration of intravenous contrast. Multiplanar reformatted images are provided for review. Dose modulation, iterative reconstruction, and/or  weight based adjustment of the mA/kV was utilized to reduce the radiation dose to as low as reasonably achievable. COMPARISON: None. HISTORY: ORDERING SYSTEM PROVIDED HISTORY: fall, ro ffx TECHNOLOGIST PROVIDED HISTORY: Reason for exam:->fall, ro ffx Decision Support Exception - unselect if not a suspected or confirmed emergency medical condition->Emergency Medical Condition (MA) What reading provider will be dictating this exam?->CRC FINDINGS: BONES/ALIGNMENT: There is no acute fracture or traumatic malalignment. DEGENERATIVE CHANGES: Prominent loss of disc heights with small disc osteophyte complexes are noted at the C5-6 and C6-7 levels.  Mild central canal stenoses are noted at these levels.  Multilevel neural foraminal stenoses, worst (severe) at the left C3-4 and bilateral C5-6 and C6-7 levels. SOFT TISSUES: There is no prevertebral soft tissue swelling.     1. No fracture or joint dislocation is seen. 2. Degenerative changes, as described. RECOMMENDATIONS: Unavailable     CT ABDOMEN PELVIS W IV CONTRAST Additional Contrast? None    Result Date: 11/09/2020  EXAMINATION: CT OF THE CHEST WITH CONTRAST; CT OF THE ABDOMEN AND PELVIS WITH CONTRAST 11/09/2020 5:06 pm TECHNIQUE: CT of the chest was performed with the administration of intravenous contrast. Multiplanar reformatted images  are provided for review. Dose modulation, iterative reconstruction, and/or weight based adjustment of the mA/kV was utilized to reduce the radiation dose to as low as reasonably achievable.; CT of the abdomen and pelvis was performed with the administration of intravenous contrast. Multiplanar reformatted images are provided for review. Dose modulation, iterative reconstruction, and/or weight based adjustment of the mA/kV was utilized to reduce the radiation dose to as low as reasonably achievable. COMPARISON: None HISTORY: ORDERING SYSTEM PROVIDED HISTORY: fall , transfer from outside facility TECHNOLOGIST PROVIDED HISTORY: Reason for  exam:->fall , transfer from outside facility Decision Support Exception - unselect if not a suspected or confirmed emergency medical condition->Emergency Medical Condition (MA) What reading provider will be dictating this exam?->CRC FINDINGS: CT chest. There is borderline cardiac size with diffuse coronary artery calcification. Moderately enlarged mediastinal lymph nodes are present.  Trachea and major bronchi are patent.  There is suggestion of a subsegmental pulmonary embolism in the right lower lobe.  Patchy and nodular infiltrates and pleural effusions are identified in the lower lobes bilaterally with minimal atelectasis in the lingula likely CHF/edema and or pneumonia.  Surveillance recommended to see resolution and exclude developing malignancy in the left lung base.  Degenerative changes are identified in the thoracic spine with diffuse degenerative changes of the shoulders with superior subluxation of the humeral head.  Healing multiple right rib fractures are noted.  There is gynecomastia. CT abdomen and pelvis. Comparison 01/06/2011. Findings The liver is of normal architecture.  The gallbladder is distended.  Consider ultrasonography.  Spleen, pancreas, and adrenals are normal.  Multiple cystic lesions are identified in the kidneys with somewhat atrophic left kidney. The cystic lesions measure up to 6.6 x 6 cm.  There is hydronephrosis in the left kidney.  There is vascular calcification aorta and degenerative changes in the thoracolumbar spine. Pelvis.  The bladder is partially distended with wall thickening.  The prostate gland is enlarged measuring 5.4 x 5.6 cm with mass effect on the bladder floor.  A 7 mm stone is identified in the distal left ureter which may be causing partial obstruction in the left kidney.  There is constipation.  The appendix is normal.     No acute traumatic injury in chest. Patchy and nodular infiltrates and pleural effusion in the lung bases concerning for pneumonia or  edema.  Surveillance recommended to see resolution. Suggestion of subsegmental pulmonary embolism in the right lower lobe. There is no acute traumatic injury or acute inflammation in the abdomen pelvis. Hydronephrosis in the left kidney with the partially obstructing 7 mm stone in the left distal ureter/UVJ. Enlarged prostate gland with slightly thickened urinary bladder.  Please correlate with urinalysis. Distended gallbladder.  Ultrasonography is recommended for better assessment. RECOMMENDATIONS: Unavailable       Discharge Exam:  @BP  (!) 146/71    Pulse 86    Temp 98 ??F (36.7 ??C)    Resp 24    Ht 6\' 2"  (1.88 m)    Wt 220 lb 3.2 oz (99.9 kg)    SpO2 100%    BMI 28.27 kg/m?? @  ??  Gen - non responsive,   Neuro - eyes closed, withdraws to pain, makes some groaning noises,     HEENT - PERRL 2 on left 3 on right L crani incision CDI,   Lungs - non labored, course b/s b/l    Heart - RR no extra heart sounds    Abdomen - flat soft  Ext- NVI    Disposition: SNF  with Hospice    In process/preliminary results:  Outstanding Order Results     Date and Time Order Name Status Description    11/21/2020  3:10 PM Culture, Blood 2 Preliminary     11/21/2020  3:00 PM Culture, Blood 1 Preliminary           Patient Instructions:   Discharge Medication List as of 11/23/2020  1:58 PM           Details   glycopyrrolate (ROBINUL) 2 MG tablet Take 1 tablet by mouth every 4 hours as needed (secreations), Disp-90 tablet, R-0Print      morphine 20 MG/5ML solution Take 1.25 mLs by mouth every 10 minutes as needed for Pain for up to 7 days., Disp-250 mL, R-0Print      LORazepam (ATIVAN) 1 MG tablet Take 1 tablet by mouth every 6 hours as needed for Anxiety for up to 30 days. May substitute with suppositories if available, Disp-30 tablet, R-1Print              Details   magnesium hydroxide (MILK OF MAGNESIA) 400 MG/5ML suspension Take 30 mLs by mouth daily as needed for ConstipationHistorical Med      simvastatin (ZOCOR) 40 MG tablet Take 40 mg by  mouth nightly Historical Med             TRAUMA SERVICES DISCHARGE INSTRUCTIONS    Call [phone], option 2, for any questions/concerns and for follow-up appointment in 2 week(s).    Please follow the instructions checked below:  Please follow-up with your primary care provider.    ACTIVITY INSTRUCTIONS  Increase activity as tolerated  No heavy lifting or strenuous activity  Take your incentive spirometer home and use 4-6 times/day     No driving until cleared by Neurosurgery    WOUND/DRESSING INSTRUCTIONS:  You may shower.  No sitting in bath tub, hot tub or swimming until cleared by physician.  Ice to areas of pain for first 24 hours.  Heat to areas of pain after that.    MEDICATION INSTRUCTIONS  Take medication as prescribed.  When taking pain medications, you may experience dizziness or drowsiness.  Do not drink alcohol or drive when taking these medications.  You may experience constipation while taking pain medication.  You may take over the counter stool softeners such as docusate (Colace), sennosides S (Senokot-S), or Miralax.     You may take acetaminophen (Tylenol) products.  Do NOT take more than  of Tylenol in 24h.    CALL 911 OR YOUR LOCAL EMERGENCY SERVICE:  --If you take too much medication  --If you have trouble breathing or shortness of breath  --A child has taken this medication.    WORK:  You may not return to work until you receive follow-up with the Trauma Clinic or clearance by all consultants.    Call the trauma clinic for any of the following or for questions/concerns;  --fever over 101F  --redness, swelling, hardness or warmth at the wound site(s).  --Unrelieved nausea/vomiting  --Foul smelling or cloudy drainage at the wound site(s)  --Unrelieved pain or increase in pain  --Increase in shortness of breath    Follow-up:  Trauma Clinic: [phone]--press option 2  [facility]  [address]  [place], [place]  [address]          MILD TRAUMATIC BRAIN  INJURY OR CONCUSSION  A mild traumatic brain injury (TBI) is one that causes some degree of injury to the  brain causing symptoms ranging from a brief period of confusion to loss of consciousness (being knocked out).   There is no major bruising or bleeding in the brain but symptoms can last from hours to months depending on the severity of the injury. Family or friends need to observe any change in behavior for the next 48 hours.  Delayed effects from head injury do occur occasionally and can be due to slow bleeding or swelling around the surface of the brain.     These effects may occur even if the x-rays/CT scans were normal.  Please observe the following symptoms during the next 24-48 hours.    CALL 911 if:  ??? Pupils (black part in the center of the eye) are unequal in size, and this is new.  ??? Seizure (convulsion).  ??? Not responding to others/won't wake up or very hard to wake up  ??? Faints (passes out)  ??? Vomiting more than 3 times    Notify the TRAUMA CLINIC if any of the following symptoms occur:  ??? Severe headache -- Mild headache may last for days. Report worsening pain or uncontrolled pain with prescribed medicine.  ??? Numbness, tingling or weakness -- Present in arms or legs; unsteady walking.  ??? Eye Changes/light sensation -- Vision problems; blurred or double-vision; unequal sized pupils.  ??? Nausea/Vomiting -- Episodes of vomiting may occur initially after a head injury.  Persistent vomiting or difficulty taking medication by mouth.  ??? Increased Sleepiness -- Difficulty waking from sleep with increased confusion.  ??? Dizziness -- Does not go away or occurs repeatedly.  Vomiting may accompany dizziness.  ??? Drainage -- Clear fluid or blood from the nose and ears.  ??? Fever -- Temperature over 101 degrees.  ??? Neck Pain.    The First Four Weeks  The symptoms below are common after a mild brain injury. They usually get better on their own within a few weeks:   ??? feeling tired or ?low???  ??? problems falling or  staying asleep  ??? feeling confused, poor concentration, or slow to answer questions  ??? feeling dizzy, poor balance, or poor coordination  ??? being sensitive to light  ??? being sensitive to sounds  ??? ringing in the ears  ??? a mild headache, sometimes with nausea and/or vomiting  ??? being irritable, having mood swings, or feeling somewhat sad or ???down???  Contact the TRAUMA CLINIC if your symptoms are affecting your everyday activities. Remember that letting yourself get too tired can make your symptoms worse. Listen to your body: if doing a certain activity increases your symptoms, take a break from that activity. Build up the amount of time you do an activity and stay under the threshold of symptoms.     Long term Effects (Post-Concussive Syndrome)    Notify physician if any of the following persists longer than 2-4 weeks.    ??? Difficulty with concentration or attention (easily distracted)  ??? Frequent headaches  ??? Memory problems   ??? Sensitivity to light   ??? Sleeping difficulties      There is a higher risk of having a more serious head injury if:  ??? Previous history of head injury or concussion  ??? Taking medicine that thins your blood, or have a bleeding disorder  ??? Have other neurologic (brain) problems  ??? Have difficulty walking or frequent falls  ??? Active in high impact contact sports, like soccer or football.    Activities  ??? Stay away from activities that could  cause another head injury (like sports), until the doctor says it's okay. A second blow to the head can cause more damage to the brain  ??? Limit reading, television, video games, etc. the first 48 hours. Your brain needs to rest so that it can recover. You may find that it helps to take time off school or work.   ??? Limit exposure to bright lights, loud noises, and crowds for the first 48 hours, as these can make your symptoms worse  ??? Limit use of screens, such as an IPad, computer, cellphone, TV, etc, as these can make symptoms, especially headaches,  worse.      Work/School  ??? It is recommended that you wait to return to work/school until after your follow-up appointment with trauma or your family doctor.  If you are having no symptoms, please call for an earlier appointment  ??? Some people find it hard to concentrate well so return to your normal activities slowly. Go back to work or school for half days at first, and increase as tolerated. Trauma services can help you with a graduated schedule.  ??? If you feel comfortable doing so, tell work or school about your concussion. You may have to adjust your activities, depending on your job or school demands.       Rest and Sleep  ??? Rest for the first 24 hours; it's one of the best things to help your brain recover.  It's okay to sleep if you want  ??? You don't have to be woken up every few hours.  If someone does wake you up, you should awaken easily.    ??? Do some light physical activity (housework) or light exercise (walking, stationary bike) as soon as you can tolerate the movement.  Strenuous exercise (such as jogging or weight lifting) can make your concussion symptoms worse or last longer.    Diet  ??? Return to a normal diet as tolerated, you may want to start with liquids first  ??? Eat healthy meals (including breakfast) and snacks throughout the day as your brain heals.    Managing Pain  ??? Tylenol or Ibuprofen are the best meds to take for headaches.  ??? Your doctor may have prescribed you medications if your headaches were severe or you have other injuries.  Please take as directed.  Driving  ??? Your ability to concentrate and react quickly might be affected by the concussion.  Please contact trauma services for advice on when to resume driving.  ??? Do not drive if you're concerned about vision problems, slowed thinking, slowed reaction time, reduced attention or poor judgment.  ??? Wear sunglasses even during winter if sunny while driving.  The bright light may induce a headache.     Alcohol use/Drug use  ??? Don't  drink alcohol or use recreational drugs as they may make you feel worse and/or hide the warning signs.        Follow-up:  Trauma Clinic: [phone] -- press option 2   [facility]  [address]  [place], [place]  [address]      SPECIAL CONSIDERATIONS FOR OUR PATIENTS OVER THE AGE OF 65Y    Getting around your home safely can be a challenge if you have injuries or health problems that make it easy for you to fall. Loose rugs and furniture in walkways are among the dangers for many older people who have problems walking or who have poor eyesight. People who have conditions such  as arthritis, osteoporosis, or dementia also must be careful not to fall.    You can make your home safer with a few simple measures.    Follow-up care is a key part of your treatment and safety. Be sure to make and go to all appointments, and call your doctor or nurse call line if you are having problems. It's also a good idea to know your test results and keep a list of the medicines you take.    How can you care for yourself at home?  Taking care of yourself  ??? You may get dizzy if you do not drink enough water. To prevent dehydration, drink plenty of fluids, enough so that your urine is light yellow or clear like water. Choose water and other caffeine-free clear liquids. If you have kidney, heart, or liver disease and have to limit fluids, talk with your doctor before you increase the amount of fluids you drink.  ??? Exercise regularly to improve your strength, muscle tone, and balance. Walk if you can. Swimming may be a good choice if you cannot walk easily.  ??? Have your vision and hearing checked each year or any time you notice a change. If you have trouble seeing and hearing, you might not be able to avoid objects and could lose your balance.  ??? Know the side effects of the medicines you take. Ask your doctor or pharmacist whether the medicines you take can affect your balance. Sleeping pills or sedatives can  affect your balance.  ??? Limit the amount of alcohol you drink. Alcohol can impair your balance and other senses.  ??? Ask your doctor whether calluses or corns on your feet need to be removed. If you wear loose-fitting shoes because of calluses or corns, you can lose your balance and fall.  ??? Talk to your doctor if you have numbness in your feet.    Preventing falls at home  ??? Remove raised doorway thresholds, throw rugs, and clutter. Repair loose carpet or raised areas in the floor.  ??? Move furniture and electrical cords to keep them out of walking paths.  ??? Use non-skid floor wax, and wipe up spills right away, especially on ceramic tile floors.  ??? If you use a walker or cane, put rubber tips on it. If you use crutches, clean the bottoms of them regularly with an abrasive pad, such as steel wool.  ??? Keep your house well lit, especially stairways, porches, and outside walkways. Use night-lights in areas such as hallways and washrooms. Add extra light switches or use remote switches (such as switches that go on or off when you clap your hands) to make it easier to turn lights on if you have to get up during the night.  ??? Install sturdy handrails on stairways.  ??? Move items in your cabinets so that the things you use a lot are on the lower shelves (about waist level).  ??? Keep a cordless phone and a flashlight with new batteries by your bed. If possible, put a phone in each of the main rooms of your house, or carry a cell phone in case you fall and cannot reach a phone. Or, you can wear a device around your neck or wrist. You push a button that sends a signal for help.  ??? Wear low-heeled shoes that fit well and give your feet good support. Use footwear with non-skid soles. Check the heels and soles of your shoes for wear. Repair or replace worn heels  or soles.  ??? Do not wear socks without shoes on wood floors.  ??? Walk on the grass when the sidewalks are slippery. If you live in an area that gets snow and ice in the  winter, sprinkle salt on slippery steps and sidewalks.    Preventing falls in the bath  ??? Install grab bars and non-skid mats inside and outside your shower or tub and near the toilet and sinks.  ??? Use shower chairs and bath benches.  ??? Use a hand-held shower head that will allow you to sit while showering.  ??? Get into a tub or shower by putting the weaker leg in first. Get out of a tub or shower with your strong side first.  ??? Repair loose toilet seats and consider installing a raised toilet seat to make getting on and off the toilet easier.  ??? Keep your washroom door unlocked while you are in the shower.    Follow-up:  Trauma Clinic: [phone]--press option 2  [facility]  [address]  [place], [place]  [address]           A ureteral stent was inserted during your recent procedure.  Unlike a heart "stent" which is metal, short, and permanent, this ureteral stent plastic, and temporary.  It spans from your kidney, down the ureter, and into your bladder.  This will need to be removed, so it is very important that you follow-up with your doctor.    IMPORTANT - This ureteral stent will likely cause you to experience frequent urination, urgency to urinate, back/flank pain with urination, and/or blood in the urine.  These things are very normal.  Taking the pain medications and/or anti-inflammatories will help to manage this discomfort if present.  If you have any questions or concerns you can contact [facility] office at [phone].     Ureteral Stent Placement: What to Expect at Home  Your Recovery     A ureteral (say "you-REE-ter-ul") stent is a thin, hollow tube that is placed in the ureter to help urine pass from the kidney into the bladder. Ureters are the tubes that connect the kidneys to the bladder.  You may have a small amount of blood in your urine for 1 to 3 days after the procedure.  While the stent is in place, you may have to urinate more often, feel a sudden need to  urinate, or feel like you can't completely empty your bladder. You may feel some pain when you urinate or do strenuous activity. You also may notice a small amount of blood in your urine after strenuous activities. These side effects usually don't prevent people from doing their normal daily activities.  You may have a thin string coming out of your urethra. Your urethra is the tube that carries urine from your bladder to outside your body. This string is attached to the stent. Try not to pull on the string. It will be used to pull out the stent when you no longer need it.  After the procedure, urine may flow better from your kidneys to your bladder. A ureteral stent may be left in place for several days or for as long as several months. Your doctor will take it out when you no longer need it. Or, in some cases, it may be taken out at home.  This care sheet gives you a general idea about how long it will take for you to recover. But each person recovers at  a different pace. Follow the steps below to get better as quickly as possible.  How can you care for yourself at home?  Activity    ?? Rest when you feel tired. Getting enough sleep will help you recover.     ?? Avoid strenuous activities, such as bicycle riding, jogging, weight lifting, or aerobic exercise, until your doctor says it is okay.     ?? Ask your doctor when you can drive again.     ?? Most people are able to return to work the day after the procedure. If your work requires intense activity, you may feel pain in your kidney area or get tired easily. If this happens, you may need to do less strenuous activities while the stent is in.     ?? Ask your doctor when it is okay for you to have sex.   Diet    ?? You can eat your normal diet. If your stomach is upset, try bland, low-fat foods like plain rice, broiled chicken, toast, and yogurt.     ?? Drink plenty of fluids (unless your doctor tells you not to).   Medicines    ?? Your doctor will tell you if and when  you can restart your medicines. You will also get instructions about taking any new medicines.     ?? If you take aspirin or some other blood thinner, ask your doctor if and when to start taking it again. Make sure that you understand exactly what your doctor wants you to do.     1. Be safe with medicines. Take pain medicines exactly as directed.  ? If the doctor gave you a prescription medicine for pain, take it as prescribed.  ? If you are not taking a prescription pain medicine, ask your doctor if you can take an over-the-counter medicine.     1. If you think your pain medicine is making you sick to your stomach:  ? Take your medicine after meals (unless your doctor has told you not to).  ? Ask your doctor for a different pain medicine.     ?? If your doctor prescribed antibiotics, take them as directed. Do not stop taking them just because you feel better. You need to take the full course of antibiotics.   Follow-up care is a key part of your treatment and safety. Be sure to make and go to all appointments, and call your doctor if you are having problems. It's also a good idea to know your test results and keep a list of the medicines you take.  When should you call for help?   Call 911 anytime you think you may need emergency care. For example, call if:    ?? You passed out (lost consciousness).     ?? You have severe trouble breathing.     ?? You have sudden chest pain and shortness of breath, or you cough up blood.     ?? You have severe belly pain.   Call your doctor now or seek immediate medical care if:    ?? Part or all of the stent comes out of your urethra.     ?? You have pain that does not get better after you take pain medicine.     1. You have symptoms of a urinary infection. For example:  ? You have blood or pus in your urine.  ? You have pain in your back just below your rib cage. This is called flank pain.  ?  You have a fever, chills, or body aches.  ? It hurts to urinate.  ? You have groin or belly  pain.     ?? You cannot control when you urinate, or you leak urine.   Watch closely for changes in your health, and be sure to contact your doctor if you have any problems.  Where can you learn more?  Go to [email or web address] and sign in to your MyChart account. Enter [phone] in the Search Health Information box to learn more about "Ureteral Stent Placement: What to Expect at Home."     If you do not have an account, please click on the "Sign Up Now" link.  Current as of: October 22, 2019               Content Version: 12.9  ?? 2006-2021 [employer].   Care instructions adapted under license by [facility]. If you have questions about a medical condition or this instruction, always ask your healthcare professional. [employer] disclaims any warranty or liability for your use of this information.         Follow up:   Fidel Levy, MD  [address]  [place] [address]  [phone]    Schedule an appointment as soon as possible for a visit in 2 weeks  For follow up after hospital/rehab discharge.     Rupert Stacks, MD  [address]  [place] [address]  [phone]    In 4 weeks      [facility]  [address] [place] [address]  [phone]    As needed    Jacki Cones, MD  [address]  [place] [address]  [phone]    Schedule an appointment as soon as possible for a visit in 2 weeks  For follow up       Signed:  Carlean Purl, APRN - CNP  11/23/2020  2:53 PM

## 2020-11-10 NOTE — Other (Signed)
Pt remains in 2 point soft restraints due to he will pull off his external foley and pull at all iv lines.  Pt needs to be in restraints for safety and rn will continue to monitor for need.

## 2020-11-10 NOTE — Progress Notes (Signed)
[facility]      NAME:  Tyler Rhodes  DOB:  [birth year]  DATE: 11/10/2020  ROOM:  3802/3802-A    Order received. Chart reviewed.   Pt unavailable at this time due to:  [x]  HOLD per RN, Pt for OR today.  []  Off unit for testing/ procedure    []  With medical staff   []  Declined intervention  []  Sleeping/ Lethargic   []  Other:     Will re-attempt later this date as able. Thank you.       Acute on chronic intracranial subdural hematoma (HCC) [I62.01, I62.03]

## 2020-11-10 NOTE — Consults (Signed)
11/10/2020 8:19 AM  Service: Urology  Group: [facility] (Carnelius Hammitt/Ricchiuti/Drevna)    Tyler Rhodes  [id number]     Chief Complaint:    Left hydronephrosis, left distal ureteral calculi    History of Present Illness:      The patient is a 87 y.o. male patient who presented to the hospital yesterday as a transfer from [facility] for neurosurgery evaluation after he was found to have left subacute on chronic subdural hematoma.  He is currently confused, restrained, and admitted in the ICU.  He is scheduled to undergo burr hole drainage of the subdural hematoma today.    He had a CT abdomen pelvis performed that showed a 80mm left distal ureteral calculi with left hydroureteronephrosis. His creatinine is currently stable at 1.0. No urine studies available for review.     He is a poor historian and unable to provide any medical history at this time.       Past Medical History:   Diagnosis Date   ??? Acute MI (HCC)     pt had mi 1997   ??? CAD (coronary artery disease)    ??? Cholesterol serum increased    ??? Hyperlipidemia    ??? Prostate cancer (HCC)    ??? Prostate cancer [facility])          Past Surgical History:   Procedure Laterality Date   ??? CARDIAC SURGERY     ??? CORONARY ANGIOPLASTY      pt had a triple by pass 1997   ??? CORONARY ANGIOPLASTY      pt had cat 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       Medications Prior to Admission:    Medications Prior to Admission: bisacodyl (DULCOLAX) 10 MG suppository, Place 10 mg rectally daily as needed for Constipation  isosorbide mononitrate (IMDUR) 120 MG extended release tablet, Take 120 mg by mouth daily  magnesium hydroxide (MILK OF MAGNESIA) 400 MG/5ML suspension, Take 30 mLs by mouth daily as needed for Constipation  pantoprazole (PROTONIX) 40 MG tablet, Take 40 mg by mouth daily  acetaminophen (TYLENOL) 325 MG tablet, Take 2 tablets by mouth every 4 hours as needed for Pain or Fever  aspirin 81 MG chewable tablet, Take 81 mg by mouth daily   clopidogrel (PLAVIX) 75 MG tablet, Take 75 mg  by mouth daily  simvastatin (ZOCOR) 40 MG tablet, Take 40 mg by mouth nightly     Allergies:    Patient has no known allergies.    Social History:    reports that he has quit smoking. He has a 10.00 pack-year smoking history. He quit smokeless tobacco use about 24 years ago. He reports that he does not drink alcohol and does not use drugs.    Family History:   Non-contributory to this Urological problem  family history includes Heart Disease in his father; Stroke in his mother.    Review of Systems:  Unable to obtain due to confusion    Physical Exam:     Vitals:  BP (!) 147/62    Pulse 97    Temp 97.9 ??F (36.6 ??C) (Axillary)    Resp 16    Ht 6\' 2"  (1.88 m)    Wt 212 lb 11.9 oz (96.5 kg)    SpO2 97%    BMI 27.31 kg/m??     General: Confused, restrained, does not follow commands  HEENT:  Normocephalic, atraumatic.  Lungs:  Respirations symmetric and non-labored.  Abdomen:  soft, nontender, no masses  Extremities:  No clubbing, cyanosis, or edema  Skin:  Warm and dry, no open lesions or rashes  Neuro: There are no motor or sensory deficits in the 4 quadrant extremities   Rectal: deferred  Genitourinary: Male external catheter with yellow urine in tubing    Labs:     Recent Labs     11/09/20  1654 11/10/20  0500   WBC 12.6* 12.9*   RBC 4.11 4.43   HGB 11.7* 12.7   HCT 36.2* 38.9   MCV 88.1 87.8   MCH 28.5 28.7   MCHC 32.3 32.6   RDW 15.1* 14.9   PLT 264 265   MPV 12.0 11.5         Recent Labs     11/09/20  1559 11/10/20  0500   CREATININE 1.0 0.9       Lab Results   Component Value Date    PSA 5.73 (H) 12/01/2014       Imaging:     Impression   No acute traumatic injury in chest.   ??   Patchy and nodular infiltrates and pleural effusion in the lung bases   concerning for pneumonia or edema. ??Surveillance recommended to see   resolution.   ??   Suggestion of subsegmental pulmonary embolism in the right lower lobe.   ??   There is no acute traumatic injury or acute inflammation in the abdomen   pelvis.   ??   Hydronephrosis  in the left kidney with the partially obstructing 7 mm stone   in the left distal ureter/UVJ.   ??   Enlarged prostate gland with slightly thickened urinary bladder. ??Please   correlate with urinalysis.   ??   Distended gallbladder. ??Ultrasonography is recommended for better assessment.   ??   RECOMMENDATIONS:   Unavailable                         Assessment/plan:  6mm left distal ureteral calculi   Left hydronephrosis  Bilateral renal cysts   Left subdural hematoma    Continue the ICU care  Neurosurgery following and planning for burr hole drainage today  Creatinine currently stable  Urinalysis and urine culture ordered  Antibiotics per primary  CTAP reviewed, there is a 13mm left distal ureteral calculi and left hydronephrosis  Keep NPO  He is going for burr holes today  We will plan on cystoscopy, retrograde pyelogram, left stent insertion tomorrow  Parameters will be placed in the meantime  Spoke with ICU resident regarding plan as well  Will follow         Electronically signed by Vivia Birmingham, APRN - CNP on 11/10/2020 at 8:19 AM   I have reviewed the patient's x-rays reviewed his chart discussed case with the nurse practitioner I have examined the patient.  We are not available at the time of his subdural hematoma evacuation to place a stent we will place him on the schedule for tomorrow.  Appears as though he has bacteremia associated with obstructing stone he will be stabilized with hydration and antibiotics until his procedure tomorrow when he will have a cystoscopy retrograde pyelogram stent insertion left.  We will have to clear this through his family or power of attorney

## 2020-11-10 NOTE — Plan of Care (Signed)
Problem: Non-Violent Restraints  Goal: No harm/injury to patient while restraints in use  Outcome: Met This Shift  Goal: Patient's dignity will be maintained  Outcome: Met This Shift     Problem: Pain:  Goal: Pain level will decrease  Description: Pain level will decrease  Outcome: Met This Shift  Goal: Control of acute pain  Description: Control of acute pain  Outcome: Met This Shift  Goal: Control of chronic pain  Description: Control of chronic pain  Outcome: Met This Shift     Problem: Non-Violent Restraints  Goal: Removal from restraints as soon as assessed to be safe  Outcome: Not Met This Shift

## 2020-11-10 NOTE — Anesthesia Pre-Procedure Evaluation (Signed)
Department of Anesthesiology  Preprocedure Note       Name:  Tyler Rhodes   Age:  87 y.o.  DOB:  January 09, 1929                                          MRN:  [id number]         Date:  11/10/2020      Surgeon: Juliann Mule):  Larene Beach, MD    Procedure: Procedure(s):  LEFT CRANIOTOMY BURR HOLESFOR SUBDURAL    Medications prior to admission:   Prior to Admission medications    Medication Sig Start Date End Date Taking? Authorizing Provider   bisacodyl (DULCOLAX) 10 MG suppository Place 10 mg rectally daily as needed for Constipation   Yes Historical Provider, MD   isosorbide mononitrate (IMDUR) 120 MG extended release tablet Take 120 mg by mouth daily   Yes Historical Provider, MD   magnesium hydroxide (MILK OF MAGNESIA) 400 MG/5ML suspension Take 30 mLs by mouth daily as needed for Constipation   Yes Historical Provider, MD   pantoprazole (PROTONIX) 40 MG tablet Take 40 mg by mouth daily   Yes Historical Provider, MD   acetaminophen (TYLENOL) 325 MG tablet Take 2 tablets by mouth every 4 hours as needed for Pain or Fever 05/11/17  Yes Mourad L Rostom, MD   aspirin 81 MG chewable tablet Take 81 mg by mouth daily    Yes Historical Provider, MD   clopidogrel (PLAVIX) 75 MG tablet Take 75 mg by mouth daily   Yes Historical Provider, MD   simvastatin (ZOCOR) 40 MG tablet Take 40 mg by mouth nightly    Yes Historical Provider, MD       Current medications:    Current Facility-Administered Medications   Medication Dose Route Frequency Provider Last Rate Last Admin   ??? sodium chloride flush 0.9 % injection 5-40 mL  5-40 mL IntraVENous 2 times per day Larene Beach, MD   10 mL at 11/10/20 0849   ??? sodium chloride flush 0.9 % injection 5-40 mL  5-40 mL IntraVENous PRN Larene Beach, MD       ??? 0.9 % sodium chloride infusion  25 mL IntraVENous PRN Larene Beach, MD       ??? ceFAZolin (ANCEF) 2000 mg in sterile water 20 mL IV syringe  2,000 mg IntraVENous On Call to OR Larene Beach, MD       ??? magnesium sulfate 2000 mg in 50 mL IVPB premix  2,000  mg IntraVENous Once Christena Flake, MD       ??? sodium phosphate 10 mmol in dextrose 5 % 250 mL IVPB  10 mmol IntraVENous Once Christena Flake, MD 62.5 mL/hr at 11/10/20 0844 10 mmol at 11/10/20 0844   ??? ceFAZolin (ANCEF) 2000 mg in sterile water 20 mL IV syringe  2,000 mg IntraVENous On Call to OR Vladimir Crofts, MD       ??? [Held by provider] isosorbide mononitrate (IMDUR) extended release tablet 120 mg  120 mg Oral Daily Cleon Gustin, MD       ??? atorvastatin (LIPITOR) tablet 20 mg  20 mg Oral Daily Cleon Gustin, MD       ??? sodium chloride flush 0.9 % injection 5-40 mL  5-40 mL IntraVENous 2 times per day Cleon Gustin, MD   10 mL at 11/10/20 0849   ??? sodium chloride  flush 0.9 % injection 5-40 mL  5-40 mL IntraVENous PRN Cleon Gustin, MD       ??? 0.9 % sodium chloride infusion  25 mL IntraVENous PRN Cleon Gustin, MD       ??? ondansetron (ZOFRAN-ODT) disintegrating tablet 4 mg  4 mg Oral Q8H PRN Cleon Gustin, MD        Or   ??? ondansetron [facility]) injection 4 mg  4 mg IntraVENous Q6H PRN Cleon Gustin, MD       ??? polyethylene glycol (GLYCOLAX) packet 17 g  17 g Oral Daily Cleon Gustin, MD       ??? acetaminophen (TYLENOL) tablet 650 mg  650 mg Oral Q6H Cleon Gustin, MD       ??? bisacodyl (DULCOLAX) EC tablet 5 mg  5 mg Oral Daily PRN Cleon Gustin, MD       ??? levETIRAcetam (KEPPRA) 500 mg/100 mL IVPB  500 mg IntraVENous Q12H Cleon Gustin, MD 400 mL/hr at 11/10/20 1004 500 mg at 11/10/20 1004   ??? 0.9 % sodium chloride infusion   IntraVENous Continuous Cleon Gustin, MD 75 mL/hr at 11/09/20 2222 New Bag at 11/09/20 2222   ??? hydrALAZINE (APRESOLINE) injection 10 mg  10 mg IntraVENous Q30 Min PRN Cleon Gustin, MD   10 mg at 11/10/20 1005   ??? labetalol (NORMODYNE;TRANDATE) injection 10 mg  10 mg IntraVENous Q30 Min PRN Cleon Gustin, MD   10 mg at 11/10/20 1015       Allergies:  No Known Allergies    Problem List:    Patient Active Problem List   Diagnosis Code   ??? Elevated serum cholesterol E78.9   ???  Hyperlipidemia E78.5   ??? Acute MI ([place]) I21.9   ??? Sepsis (HCC) A41.9   ??? 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 Anemia D64.9   ??? Acute on chronic intracranial subdural hematoma (HCC) I62.01, I62.03   ??? Single subsegmental pulmonary embolism without acute cor pulmonale (HCC) I26.93   ??? Delirium R41.0   ??? Slurred speech R47.81   ??? Hydronephrosis with urinary obstruction due to ureteral calculus N13.2   ??? Pleural effusion J90       Past Medical History:        Diagnosis Date   ??? Acute MI ([place])     pt had mi 1997   ??? CAD (coronary artery disease)    ??? Cholesterol serum increased    ??? Hyperlipidemia    ??? Prostate cancer ([place])    ??? Prostate cancer [facility])        Past Surgical History:        Procedure Laterality Date   ??? CARDIAC SURGERY     ??? CORONARY ANGIOPLASTY      pt had a triple by pass 1997   ??? CORONARY ANGIOPLASTY      pt had cat 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       Social History:    Social History     Tobacco Use   ??? Smoking status: Former Smoker     Packs/day: 1.00     Years: 10.00     Pack years: 10.00   ??? Smokeless tobacco: Former Systems developer     Quit date: 05/30/1996   Substance Use Topics   ??? Alcohol use: No                                Counseling given:  Not Answered      Vital Signs (Current):   Vitals:    11/10/20 0700 11/10/20 0800 11/10/20 0900 11/10/20 1000   BP: (!) 147/62 (!) 157/66 (!) 132/55 (!) 146/64   Pulse: 97 96 86 89   Resp: _0 Temp:  37.6 ??C (99.7 ??F)  37.8 ??C (100 ??F)   TempSrc:  Axillary  Axillary   SpO2:  98% 95% 94%   Weight:       Height:                                                  BP Readings from Last 3 Encounters:   11/10/20 (!) 146/64   11/10/20 132/64   05/11/17 135/67       NPO Status:  > 12 hours                                                                                BMI:   Wt Readings from Last 3 Encounters:   11/10/20 212 lb 11.9 oz (96.5 kg)   06/19/17 225 lb (102.1 kg)   05/09/17 224 lb 6.4 oz (101.8 kg)     Body mass index is 27.31 kg/m??.    CBC:   Lab Results    Component Value Date    WBC 12.9 11/10/2020    RBC 4.43 11/10/2020    HGB 12.7 11/10/2020    HCT 38.9 11/10/2020    MCV 87.8 11/10/2020    RDW 14.9 11/10/2020    PLT 265 11/10/2020       CMP:   Lab Results   Component Value Date    NA 137 11/10/2020    K 4.5 11/10/2020    CL 103 11/10/2020    CO2 23 11/10/2020    BUN 15 11/10/2020    CREATININE 0.9 11/10/2020    GFRAA >60 11/10/2020    LABGLOM >60 11/10/2020    GLUCOSE 109 11/10/2020    GLUCOSE 107 01/08/2011    PROT 6.4 11/10/2020    CALCIUM 8.4 11/10/2020    BILITOT 0.5 11/10/2020    ALKPHOS 82 11/10/2020    AST 20 11/10/2020    ALT 12 11/10/2020       POC Tests: No results for input(s): POCGLU, POCNA, POCK, POCCL, POCBUN, POCHEMO, POCHCT in the last 72 hours.    Coags:   Lab Results   Component Value Date    PROTIME 13.3 11/09/2020    PROTIME 13.0 01/06/2011    INR 1.2 11/09/2020    APTT 27.9 11/09/2020       HCG (If Applicable): No results found for: PREGTESTUR, PREGSERUM, HCG, HCGQUANT     ABGs: No results found for: PHART, PO2ART, PCO2ART, HCO3ART, BEART, O2SATART     Type & Screen (If Applicable):  No results found for: LABABO, LABRH    Drug/Infectious Status (If Applicable):  No results found for: HIV, HEPCAB    COVID-19 Screening (If Applicable):   Lab Results   Component Value Date    COVID19  Not Detected 11/09/2020           Anesthesia Evaluation  Patient summary reviewed and Nursing notes reviewed no history of anesthetic complications:   Airway: Mallampati: Unable to assess / NA  TM distance: >3 FB   Neck ROM: limited  Mouth opening: < 3 FB Dental:      Comment: Unable to assess d/t patient confusion     Pulmonary:                              Cardiovascular:    (+) past MI:, CAD:,       ECG reviewed                        Neuro/Psych:   (+) psychiatric history:             ROS comment: Fall, SDH  confusion  GI/Hepatic/Renal:   (+) GERD:,           Endo/Other:    (+) blood dyscrasia: anticoagulation therapy, arthritis:., .                 Abdominal:              Vascular:          Other Findings:             Anesthesia Plan      MAC     ASA 3 - emergent       Induction: intravenous.      Anesthetic plan and risks discussed with patient and child/children.      Plan discussed with attending.                  Huel Coventry, APRN - CRNA   11/10/2020

## 2020-11-10 NOTE — Op Note (Signed)
[facility]                  [address] [place], [place] [address]                                OPERATIVE REPORT    PATIENT NAME: Tyler Rhodes, Tyler Rhodes                       DOB:        [birth year]  MED REC NO:   [id number]                            ROOM:       3802  ACCOUNT NO:   [id number]                           ADMIT DATE: 11/09/2020  PROVIDER:     Rupert Stacks, MD    DATE OF PROCEDURE:  11/10/2020    PREOPERATIVE DIAGNOSIS:  Left frontotemporal subacute on chronic  subdural hematoma.    POSTOPERATIVE DIAGNOSIS:  Left frontotemporal subacute on chronic  subdural hematoma.    OPERATIVE PROCEDURES:  1.  Left frontotemporal bur hole drainage of subdural hematoma.  2.  Placement of subdural drain.  3.  AS modifier for Altamease Oiler, PA-C who assisted with primary  exposure and primary closure.    ANESTHESIA:  Monitored anesthesia care.    SURGEON:  Rupert Stacks, M.D.    ASSISTANT:  Altamease Oiler, PA-C    COMPLICATIONS:  None.    ESTIMATED BLOOD LOSS:  100 mL.    SPECIMEN:  None.    OPERATIVE INDICATIONS:  The patient is a 87 year old gentleman who  presented to the hospital yesterday after a fall.  He had some altered  mental status and expressive aphasia, was found to have left  frontotemporal subdural hematoma that was subacute on chronic and after  risks, benefits, and alternatives were discussed with the patient and  his family, it was determined that he would undergo the above-listed  procedure.  Please note Altamease Oiler, PA-C's services were required as  he was the only qualified assistant to assist with primary exposure and  primary closure.    DESCRIPTION OF PROCEDURE:  The patient was brought into the operating  room.  A time-out was performed where he was identified by his name,  medical record number and the operative procedure which he was about to  undergo.  Next, induction of monitored anesthesia care was then  commenced.  Upon completion of induction of  monitored anesthesia care,  he received preoperative antibiotics.  He was then positioned on the  operating table with a shoulder roll underneath his left shoulder.  His  head was then turned towards the right side.  Next, his hair was clipped  and after this was done, two incisions were marked out in the  left  frontal region, one in the anterior frontal fossa, the other one in the  posterior frontal fossa.  These incisions were then prepped and draped  in the usual sterile fashion.  After this was done, I then proceeded to  infiltrate skin with lidocaine with epinephrine 1:200,000.  I used a  #10-blade to open up both incisions.  Monopolar cautery was used to  dissect through the five  layers of the scalp, down to the pericranium.   I placed self-retaining Weitlaner retractors into both incisions.  I  used a high-speed bur with a perforated bit to drill two bur holes.   After bur holes were drilled, I used a small straight curette to detach  the small piece of periosteum that was left in place.  I then proceeded  to then coagulate the dura.  I opened the dura with a 15-blade in a  cruciate fashion at both incisions.  I then coagulated the dura, there  was egress of some dark fluid consistent with subacute on chronic  subdural hematoma.  After spontaneous egress had ceased, I then inserted  a red rubber catheter into the subdural space, and I irrigated the  subdural space and subsequently proceeded to then place a subdural drain  that was tunneled out anteriorly.  I was able to anchor it down to the  skin using 3-0 nylon suture.  I then proceeded to close both bur holes  with Stryker Leibinger cranial fixation system.  I closed the incision  in layers using 2-0 Vicryl for the galea and staples for the skin.  A  dry sterile dressing was placed over this.  The patient was then  subsequently transported to the surgical intensive care unit in stable  condition.  There were no complications.  Counts were correct.  I  was  present for the entire case.        Rupert Stacks, MD    D: 11/10/2020 16:19:07       T: 11/10/2020 16:21:21     [id number]  Job#: [id number]     Doc#: [id number]    CC:

## 2020-11-11 ENCOUNTER — Inpatient Hospital Stay: Admit: 2020-11-11 | Payer: MEDICARE | Primary: Internal Medicine

## 2020-11-11 LAB — MICROSCOPIC URINALYSIS: WBC, UA: >20 /HPF — AB (ref 0–5)

## 2020-11-11 LAB — CBC WITH AUTO DIFFERENTIAL
Basophils %: 0.2 % (ref 0.0–2.0)
Basophils Absolute: 0 E9/L (ref 0.00–0.20)
Eosinophils %: 0 % (ref 0.0–6.0)
Eosinophils Absolute: 0 E9/L — ABNORMAL LOW (ref 0.05–0.50)
Hematocrit: 39.5 % (ref 37.0–54.0)
Hemoglobin: 12.8 g/dL (ref 12.5–16.5)
Lymphocytes %: 9.6 % — ABNORMAL LOW (ref 20.0–42.0)
Lymphocytes Absolute: 2.43 E9/L (ref 1.50–4.00)
MCH: 29 pg (ref 26.0–35.0)
MCHC: 32.4 % (ref 32.0–34.5)
MCV: 89.4 fL (ref 80.0–99.9)
MPV: 11.2 fL (ref 7.0–12.0)
Monocytes %: 10.4 % (ref 2.0–12.0)
Monocytes Absolute: 2.43 E9/L — ABNORMAL HIGH (ref 0.10–0.95)
Myelocyte Percent: 0.9 % (ref 0–0)
Neutrophils %: 79.1 % (ref 43.0–80.0)
Neutrophils Absolute: 19.44 E9/L — ABNORMAL HIGH (ref 1.80–7.30)
Platelets: 266 E9/L (ref 130–450)
RBC: 4.42 E12/L (ref 3.80–5.80)
RDW: 15.3 fL — ABNORMAL HIGH (ref 11.5–15.0)
WBC: 24.3 E9/L — ABNORMAL HIGH (ref 4.5–11.5)

## 2020-11-11 LAB — URINALYSIS
Bilirubin Urine: NEGATIVE
Glucose, Ur: NEGATIVE mg/dL
Ketones, Urine: NEGATIVE mg/dL
Nitrite, Urine: POSITIVE — AB
Protein, UA: 30 mg/dL — AB
Specific Gravity, UA: 1.02 (ref 1.005–1.030)
Urobilinogen, Urine: 0.2 E.U./dL (ref <2.0)
pH, UA: 6 (ref 5.0–9.0)

## 2020-11-11 LAB — COMPREHENSIVE METABOLIC PANEL W/ REFLEX TO MG FOR LOW K
ALT: 10 U/L (ref 0–40)
AST: 21 U/L (ref 0–39)
Albumin: 2.9 g/dL — ABNORMAL LOW (ref 3.5–5.2)
Alkaline Phosphatase: 80 U/L (ref 40–129)
Anion Gap: 11 mmol/L (ref 7–16)
BUN: 20 mg/dL (ref 6–23)
CO2: 21 mmol/L — ABNORMAL LOW (ref 22–29)
Calcium: 8.2 mg/dL — ABNORMAL LOW (ref 8.6–10.2)
Chloride: 107 mmol/L (ref 98–107)
Creatinine: 1.2 mg/dL (ref 0.7–1.2)
GFR African American: >60
GFR Non-African American: 57 mL/min/{1.73_m2} (ref >=60)
Glucose: 126 mg/dL — ABNORMAL HIGH (ref 74–99)
Potassium reflex Magnesium: 3.9 mmol/L (ref 3.5–5.0)
Sodium: 139 mmol/L (ref 132–146)
Total Bilirubin: 0.6 mg/dL (ref 0.0–1.2)
Total Protein: 6.2 g/dL — ABNORMAL LOW (ref 6.4–8.3)

## 2020-11-11 LAB — TYPE AND SCREEN
ABO/Rh: O POS
Antibody Screen: NEGATIVE

## 2020-11-11 LAB — MAGNESIUM: Magnesium: 2.4 mg/dL (ref 1.6–2.6)

## 2020-11-11 LAB — CALCIUM, IONIZED: Calcium, Ionized: 1.23 mmol/L (ref 1.15–1.33)

## 2020-11-11 LAB — CULTURE, MRSA, SCREENING

## 2020-11-11 LAB — PHOSPHORUS: Phosphorus: 3.3 mg/dL (ref 2.5–4.5)

## 2020-11-11 MED ORDER — PROPOFOL 200 MG/20ML IV EMUL
20020 MG/20ML | INTRAVENOUS | Status: DC | PRN
Start: 2020-11-11 — End: 2020-11-11
  Administered 2020-11-11: 16:00:00 30 via INTRAVENOUS
  Administered 2020-11-11: 16:00:00 15 via INTRAVENOUS
  Administered 2020-11-11: 16:00:00 20 via INTRAVENOUS

## 2020-11-11 MED ORDER — HEPARIN SODIUM (PORCINE) 1000 UNIT/ML IJ SOLN
1000 UNIT/ML | INTRAMUSCULAR | Status: DC | PRN
Start: 2020-11-11 — End: 2020-11-11
  Administered 2020-11-11: 16:00:00

## 2020-11-11 MED ORDER — LIDOCAINE HCL (CARDIAC) PF 100 MG/5ML IV SOLN
1005 MG/5ML | INTRAVENOUS | Status: DC | PRN
Start: 2020-11-11 — End: 2020-11-11
  Administered 2020-11-11: 16:00:00 40 via INTRAVENOUS

## 2020-11-11 MED ORDER — BUPIVACAINE HCL (PF) 0.25 % IJ SOLN
0.25 | INTRAMUSCULAR | Status: AC
Start: 2020-11-11 — End: 2020-11-11

## 2020-11-11 MED ORDER — LIDOCAINE HCL 1 % IJ SOLN
1 | INTRAMUSCULAR | Status: AC
Start: 2020-11-11 — End: 2020-11-11

## 2020-11-11 MED ORDER — FENTANYL CITRATE (PF) 100 MCG/2ML IJ SOLN
100 MCG/2ML | INTRAMUSCULAR | Status: DC | PRN
Start: 2020-11-11 — End: 2020-11-12

## 2020-11-11 MED ORDER — PHENYLEPHRINE HCL 1 MG/10ML IV SOSY
1 MG/0ML | INTRAVENOUS | Status: DC | PRN
Start: 2020-11-11 — End: 2020-11-11
  Administered 2020-11-11: 16:00:00 50 via INTRAVENOUS

## 2020-11-11 MED ORDER — PROPOFOL 200 MG/20ML IV EMUL
200 | INTRAVENOUS | Status: AC
Start: 2020-11-11 — End: 2020-11-11

## 2020-11-11 MED ORDER — HYDRALAZINE HCL 20 MG/ML IJ SOLN
20 MG/ML | INTRAMUSCULAR | Status: DC | PRN
Start: 2020-11-11 — End: 2020-11-12

## 2020-11-11 MED ORDER — BUPIVACAINE HCL 0.25 % IJ SOLN (MIXTURES ONLY)
0.25 % | INTRAMUSCULAR | Status: DC | PRN
Start: 2020-11-11 — End: 2020-11-11
  Administered 2020-11-11: 16:00:00 via INTRADERMAL

## 2020-11-11 MED ORDER — MORPHINE SULFATE (PF) 2 MG/ML IV SOLN
2 MG/ML | INTRAVENOUS | Status: DC | PRN
Start: 2020-11-11 — End: 2020-11-11

## 2020-11-11 MED ORDER — PANTOPRAZOLE SODIUM 40 MG IV SOLR
40 MG | Freq: Every day | INTRAVENOUS | Status: DC
Start: 2020-11-11 — End: 2020-11-12
  Administered 2020-11-11 – 2020-11-12 (×2): via INTRAVENOUS

## 2020-11-11 MED ORDER — NORMAL SALINE FLUSH 0.9 % IV SOLN
0.9 % | Freq: Two times a day (BID) | INTRAVENOUS | Status: DC
Start: 2020-11-11 — End: 2020-11-18
  Administered 2020-11-11 – 2020-11-18 (×12): via INTRAVENOUS

## 2020-11-11 MED ORDER — HEPARIN SODIUM (PORCINE) 1000 UNIT/ML IJ SOLN
1000 | INTRAMUSCULAR | Status: AC
Start: 2020-11-11 — End: 2020-11-11

## 2020-11-11 MED ORDER — SODIUM CHLORIDE 0.9 % IV SOLN
0.9 % | INTRAVENOUS | Status: DC | PRN
Start: 2020-11-11 — End: 2020-11-11
  Administered 2020-11-11 (×2): via INTRAVENOUS

## 2020-11-11 MED ORDER — SODIUM CHLORIDE 0.9 % IV SOLN
0.9 % | INTRAVENOUS | Status: DC | PRN
Start: 2020-11-11 — End: 2020-11-12

## 2020-11-11 MED ORDER — CEFAZOLIN SODIUM 1 G IJ SOLR
1 | INTRAMUSCULAR | Status: AC
Start: 2020-11-11 — End: 2020-11-11

## 2020-11-11 MED ORDER — LIDOCAINE HCL (CARDIAC) PF 100 MG/5ML IV SOLN
100 | INTRAVENOUS | Status: AC
Start: 2020-11-11 — End: 2020-11-11

## 2020-11-11 MED ORDER — CEFAZOLIN SODIUM 1 G IJ SOLR
1 g | INTRAMUSCULAR | Status: DC | PRN
Start: 2020-11-11 — End: 2020-11-11
  Administered 2020-11-11: 16:00:00 2000 via INTRAVENOUS

## 2020-11-11 MED ORDER — LACTATED RINGERS IV SOLN
INTRAVENOUS | Status: DC
Start: 2020-11-11 — End: 2020-11-12
  Administered 2020-11-11 – 2020-11-12 (×2): via INTRAVENOUS

## 2020-11-11 MED ORDER — PHENYLEPHRINE HCL 1 MG/10ML IV SOSY
1 | INTRAVENOUS | Status: AC
Start: 2020-11-11 — End: 2020-11-11

## 2020-11-11 MED ORDER — NORMAL SALINE FLUSH 0.9 % IV SOLN
0.9 % | INTRAVENOUS | Status: DC | PRN
Start: 2020-11-11 — End: 2020-11-12

## 2020-11-11 MED ORDER — SODIUM CHLORIDE (PF) 0.9 % IJ SOLN
0.9 % | Freq: Every day | INTRAMUSCULAR | Status: DC
Start: 2020-11-11 — End: 2020-11-12
  Administered 2020-11-11: 15:00:00 via INTRAVENOUS

## 2020-11-11 MED FILL — PROPOFOL 200 MG/20ML IV EMUL: 200 MG/20ML | INTRAVENOUS | Qty: 20

## 2020-11-11 MED FILL — CEFTRIAXONE SODIUM 1 G IJ SOLR: 1 g | INTRAMUSCULAR | Qty: 1000

## 2020-11-11 MED FILL — HEPARIN SODIUM (PORCINE) 1000 UNIT/ML IJ SOLN: 1000 [IU]/mL | INTRAMUSCULAR | Qty: 1

## 2020-11-11 MED FILL — BD POSIFLUSH 0.9 % IV SOLN: 0.9 % | INTRAVENOUS | Qty: 40

## 2020-11-11 MED FILL — CEFAZOLIN SODIUM 1 G IJ SOLR: 1 g | INTRAMUSCULAR | Qty: 1000

## 2020-11-11 MED FILL — LIDOCAINE HCL (CARDIAC) PF 100 MG/5ML IV SOLN: 100 MG/5ML | INTRAVENOUS | Qty: 5

## 2020-11-11 MED FILL — LEVETIRACETAM IN NACL 500 MG/100ML IV SOLN: 500 MG/100ML | INTRAVENOUS | Qty: 100

## 2020-11-11 MED FILL — PROTONIX 40 MG IV SOLR: 40 mg | INTRAVENOUS | Qty: 40

## 2020-11-11 MED FILL — PHENYLEPHRINE HCL 1 MG/10ML IV SOSY: 1 MG/0ML | INTRAVENOUS | Qty: 10

## 2020-11-11 MED FILL — MAPAP 325 MG PO TABS: 325 MG | ORAL | Qty: 2

## 2020-11-11 MED FILL — BUPIVACAINE HCL (PF) 0.25 % IJ SOLN: 0.25 % | INTRAMUSCULAR | Qty: 10

## 2020-11-11 MED FILL — XYLOCAINE 1 % IJ SOLN: 1 % | INTRAMUSCULAR | Qty: 20

## 2020-11-11 MED FILL — FENTANYL CITRATE (PF) 100 MCG/2ML IJ SOLN: 100 MCG/2ML | INTRAMUSCULAR | Qty: 2

## 2020-11-11 NOTE — Progress Notes (Signed)
Danford Bad of the Urology group was called. She stated that the family was contacted the day before on 11-10-20, and the procedure was explained to their satisfaction.

## 2020-11-11 NOTE — Brief Op Note (Signed)
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 Item Serial No. Manufacturer Lot No. LRB No. Used Action   Renard Matter [facility] PERCFLX FIRM DUROMETER DBL Mancel Parsons [id number]  Charlyne Petrin 6FR [facility] PERCFLX FIRM DUROMETER DBL Omer Jack [employer] [id number] Left 1 Implanted         Drains:   Ventriculostomy (Active)   Drain Status Open 11/11/20 0500   Dressing Status Clean;Intact 11/11/20 0000   Output (ml) 0 ml 11/11/20 0500       Urethral Catheter Latex 16 fr (Active)       Ventricular Device Ventricular drainage catheter Left (Active)       [REMOVED] Urethral Catheter 16 fr (Removed)   $ Urethral catheter insertion $ Not inserted for procedure 11/10/20 2200   Catheter Indications Need for fluid volume management of the critically ill patient in a critical care setting 11/11/20 0800   Urine Color Yellow 11/11/20 0800   Urine Appearance Cloudy 11/11/20 0800   Output (mL) 10 mL 11/11/20 1000       [REMOVED] External Urinary Catheter (Removed)   Catheter changed  Yes 11/10/20 0630   Urine Color Yellow 11/10/20 0630   Output (mL) 100 mL 11/10/20 0630   Suction 40 mmgHg continuous 11/10/20 1400   Placement Replaced 11/10/20 0630       Findings:     Electronically signed by Jacki Cones, MD on 11/11/2020 at 11:58 AM

## 2020-11-11 NOTE — Other (Signed)
Patient thrashes in bed, and reaches for and pulls on vital lines and drain during assessment despite redirection and education. Bilateral soft wrist restraints will be maintained for patient safety. Will continue to monitor.

## 2020-11-11 NOTE — Progress Notes (Signed)
Ok to use [employer] per Dr. Pilar Plate.

## 2020-11-11 NOTE — Anesthesia Post-Procedure Evaluation (Signed)
Department of Anesthesiology  Postprocedure Note    Patient: Tyler Rhodes  MRN: [id number]  Birthdate: 03-Apr-1929  Date of evaluation: 11/11/2020  Time:  2:32 PM     Procedure Summary     Date: 11/11/20 Room / Location: [facility] OR 11 / [facility]    Anesthesia Start: 1021 Anesthesia Stop: 1210    Procedure: CYSTOSCOPY RETROGRADE PYELOGRAM LEFT STENT INSERTION (N/A Bladder) Diagnosis: (ANYTIME AFTER ONE)    Surgeons: Trena Platt, MD Responsible Provider: Oscar La, DO    Anesthesia Type: MAC ASA Status: 4          Anesthesia Type: MAC    Aldrete Phase I: Aldrete Score: 10    Aldrete Phase II:      Last vitals: Reviewed and per EMR flowsheets.       Anesthesia Post Evaluation    Patient location during evaluation: PACU  Patient participation: complete - patient participated  Level of consciousness: awake and alert  Pain score: 1  Airway patency: patent  Nausea & Vomiting: no nausea and no vomiting  Complications: no  Cardiovascular status: hemodynamically stable  Respiratory status: acceptable  Hydration status: euvolemic

## 2020-11-11 NOTE — Progress Notes (Addendum)
SPEECH/LANGUAGE PATHOLOGY  SPEECH/LANGUAGE/COGNITIVE EVALUATION   and PLAN OF CARE      PATIENT NAME:  Tyler Rhodes  (male)     MRN:  [id number]    DOB:  Aug 12, 1929  (87 y.o.)  STATUS:  Inpatient: Room 3802/3802-A    TODAY'S DATE:  11/11/2020  11/09/20 2200   Speech language pathology evaluation Start: 11/09/20 2200, End: 11/09/20 2200, ONE TIME, Standing Count: 1 Occurrences, R    Last continued at transfer on Wed Nov 10, 2020 4:47 PM     REASON FOR REFERRAL:  sdh S/p drain  EVALUATING THERAPIST: Tora Duck, SLP    ADMITTING DIAGNOSIS: Acute on chronic intracranial subdural hematoma ([place]) [I62.01, I62.03]    VISIT DIAGNOSIS:   Visit Diagnoses       Codes    Subdural hemorrhage ([place])    -  Primary I62.00           SPEECH THERAPY  PLAN OF CARE   The speech therapy  POC is established based on physician order, speech pathology diagnosis and results of clinical assessment     SPEECH PATHOLOGY DIAGNOSIS:    Severe Cognitive and Language Deficits     Speech Pathology intervention is recommended up to 6 times per week for LOS or when goals are met with emphasis on the following:      Conditions Requiring Skilled Therapeutic Intervention for speech, language and/or cognition    Receptive Aphasia  Expressive Aphasia   Dysarthria   Cognitive linguistic impairment    Specific Speech Therapy Interventions to Include:   Receptive language training   Expressive language training   Therapeutic tasks for Cognition    Specific instructions for next treatment:     To initiate POC    SHORT/LONG TERM GOALS  Pt will improve orientation to spatial and temporal surroundings with use of external memory aides.  Pt will Improve receptive language skills for comprehension of simple level yes/no questions, basic commands (auditory/written format), and for comprehension of basic conversation with 50% accuracy  Pt will improve expressive language skills to improve accuracy of completion of automatic speech tasks, object/picture naming, phrase  completion, and phrasal expression of basic wants and needs with 50% accuracy    Patient goals: Patient/family involved in developing goals and treatment plan:   Treatment goals discussed with Patient    The Patient did not demonstrate complete understanding of the diagnosis, prognosis and plan of care   The patient/family Did not state,     This plan may be re-evaluated and revised as warranted.        Rehabilitation Potential/Prognosis: guarded                CLINICAL ASSESSMENT:  MOTOR SPEECH       Oral Peripheral Examination   Could not test    Parameters of Speech Production  Respiration:  Adequate for speech production  Articulation:  Within functional limits  Resonance:  [place]  Quality:   Harsh  Pitch:    Within functional limits  Intensity: Loud  Fluency:  Intact  Prosody Monotone    RECEPTIVE LANGUAGE    Comprehension of Yes/No Questions:   Impaired    Process  Simple Verbal Commands:   Inconsistent  Process Intermediate Verbal Commands:   Could not test  Process Complex Verbal Commands:     Could not test    Comprehension of Conversation:      Inconsistent and Impaired      EXPRESSIVE LANGUAGE  Serials: Impaired    Imitation:  Words   To be assessed   Sentences To be assessed    Naming:  (Modality used:  Verbal)  Confrontation Naming  To be assessed  Functional Description  To be assessed  Response Naming: To be assessed    Conversation:      Anomia was present, Neologistic errors were noted and Running jargon was present    COGNITION     Attention/Orientation  Attention: Sustained attention   Orientation:  Oriented to Person    Memory   Immediate Recall: TBA    Delayed Recall:   TBA    Long Term Recall:   Unable to recall age/ address/ family    Organization/Problem Solving/Reasoning   Verbal Sequencing:   To be assessed        Verbal Problem solving:   To be assessed          CLINICAL OBSERVATIONS NOTED DURING THE EVALUATION  Inconsistent responses, Anomic errors, Paraphasic errors, Cueing was  required, Flat affect and Reduced eye contact                  EDUCATION:   The Psychologist, clinical (SLP) completed education regarding results of evaluation and that intervention is warranted at this time.  Learner: Patient  Education: Reviewed results and recommendations of this evaluation  Evaluation of Education:  No evidence of learning    Evaluation Time includes thorough review of current medical information, gathering information on past medical history/social history and prior level of function, completion of standardized testing/informal observation of tasks, assessment of data and education on plan of care and goals.      CPT code:    92523  eval speech sound lang comprehension    INTERVENTION  CPT Code: 83151  speech/language tx    Intervention provided to assist Pt in expressive lang production of auto speech tasks with poor outcome. Pt unable to initiate indep as well as unable to repeat or produce in unison with SLP. Orientation provided however Pt unaware of errors. Will cont as able.     The admitting diagnosis and active problem list, as listed below have been reviewed prior to initiation of this evaluation.        ACTIVE PROBLEM LIST:   Patient Active Problem List   Diagnosis   ??? Elevated serum cholesterol   ??? Hyperlipidemia   ??? Acute MI ([place])   ??? Sepsis ([place])   ??? Arthritis of knee, right   ??? Anemia   ??? Acute on chronic intracranial subdural hematoma (HCC)   ??? Single subsegmental pulmonary embolism without acute cor pulmonale ([place])   ??? Delirium   ??? Slurred speech   ??? Hydronephrosis with urinary obstruction due to ureteral calculus   ??? Pleural effusion       Kerrie Buffalo [place]  [id number]  11/11/2020

## 2020-11-11 NOTE — Op Note (Signed)
Operative Note      Patient: Tyler Rhodes  Date of Birth: 1929-08-23  MRN: [id number]    Date of Procedure: 11/11/2020    Pre-Op Diagnosis: PE    Post-Op Diagnosis: Same       Procedure(s):  INFERIOR VENA CAVA FILTER PLACEMENT    Surgeon(s):  Louie Boston, MD    Assistant:   * No surgical staff found *    Anesthesia: Monitor Anesthesia Care    Estimated Blood Loss (mL): Minimal    Complications: None    Specimens:   * No specimens in log *    Implants:  * No implants in log *      Drains:   Ventriculostomy (Active)   Drain Status Open 11/11/20 0500   Dressing Status Clean;Intact 11/11/20 0000   Output (ml) 0 ml 11/11/20 0500       Urethral Catheter 16 fr (Active)   $ Urethral catheter insertion $ Not inserted for procedure 11/10/20 2200   Catheter Indications Need for fluid volume management of the critically ill patient in a critical care setting 11/11/20 0800   Urine Color Yellow 11/11/20 0800   Urine Appearance Cloudy 11/11/20 0800   Output (mL) 10 mL 11/11/20 1000       Ventricular Device Ventricular drainage catheter Left (Active)       [REMOVED] External Urinary Catheter (Removed)   Catheter changed  Yes 11/10/20 0630   Urine Color Yellow 11/10/20 0630   Output (mL) 100 mL 11/10/20 0630   Suction 40 mmgHg continuous 11/10/20 1400   Placement Replaced 11/10/20 0630     DESCRIPTION OF OPERATION:    The patient was identified and the procedure confirmed.  The groin regions were prepped and draped in the usual sterile fashion.  Lidocaine 1% mixed with marcaine 0.25% were used for local anesthesia.      The right femoral vein was identified under [place], noted to be patent and was percutaneously entered under US guidance.  A printer was not available to print an image.  The wire was advanced into the inferior vena cava under fluoroscopic guidance.  A small incision was made around the wire.  The dilator was passed over the wire followed by the introducer, which was advanced into the inferior vena cava.  The filter  was advanced through the introducer then positioned and deployed at the level of the 3rd vertebra.  The filter deployed properly.      The introducer was removed and pressure was held for hemostasis.  A sterile pressure bandage was applied to the puncture site in the operating room.  Needle, sponge, and instrument counts were reported as correct.  The patient tolerated the procedure and was transferred to the floor in satisfactory condition.    Louie Boston, MD    CC : Fidel Levy, MD  Electronically signed by Louie Boston, MD on 11/11/2020 at 10:50 AM

## 2020-11-11 NOTE — Op Note (Signed)
[facility]                  [address] [place], [place] [address]                                OPERATIVE REPORT    PATIENT NAME: Tyler Rhodes, Tyler Rhodes                       DOB:        [birth year]  MED REC NO:   [id number]                            ROOM:       3802  ACCOUNT NO:   [id number]                           ADMIT DATE: 11/09/2020  PROVIDER:     Benjamine Sprague, MD    DATE OF PROCEDURE:  11/11/2020    PREOPERATIVE DIAGNOSES:  Left distal ureteral calculus with obstruction,  urinary tract infection and sepsis.    POSTOPERATIVE DIAGNOSES:  Left distal ureteral calculus with  obstruction, urinary tract infection and sepsis.    OPERATIONS:  Cystopanendoscopy, stent insertion on the left.    ANESTHESIA:  LMAC.    ESTIMATED BLOOD LOSS:  Less than 50.    SURGEON:  Benjamine Sprague, MD    DESCRIPTION OF PROCEDURE:  Operation was done in conjunction with a  procedure by Dr. Demetrius Charity. K.  After the vena cava filter had been placed, the  patient was placed in the lithotomy position and was prepped and draped  in a sterile manner.  He was found to have a phimotic prepuce which was  dilated with a hemostat allowing placement of a panendoscope into his  urethra.  There were no strictures or lesions seen.  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  Upon entering the  bladder, the bladder was terribly trabeculated with cellule and  diverticular formation in most areas.  The trigone was badly distorted.   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  An attempt  to place the panendoscope within the [place] diverticulum was not totally  successful.  I spent a great deal of time looking for a second orifice.   I was able to catheterize what appeared to be the lower pole segment of  the left kidney.  An open-ended catheter was passed up to the kidney.   The ureter was terribly tortuous and dilated.  Aspiration of the lower  pole of the kidney revealed  purulent urine following which a 28 cm  6-French double-J stent was placed.  There appeared to be a drooping  lily sign.  I hunted for a second orifice, but could not locate the  second orifice which may have been located in the diverticulum.  The  procedure was terminated after the stent was put in place and the  bladder was drained with a 16-French Foley catheter.  The plan is to  observe the patient for any further signs of sepsis.  If he continues to  have a sepsis and obstruction, a repeat CAT scan and possibly a  percutaneous nephrostomy of what I believe may be an upper pole segment,  although it certainly was not  really seen on the initial CAT scan.        Benjamine Sprague, MD    D: 11/11/2020 12:11:17       T: 11/11/2020 12:14:27     [id number]  Job#: [id number]     Doc#: [id number]    CC:

## 2020-11-11 NOTE — Plan of Care (Signed)
Problem: Non-Violent Restraints  Goal: Removal from restraints as soon as assessed to be safe  11/11/2020 2150 by Virgia Land, RN  Outcome: Ongoing     Problem: Non-Violent Restraints  Goal: Patient's dignity will be maintained  11/11/2020 2150 by Virgia Land, RN  Outcome: Met This Shift     Problem: Pain:  Goal: Pain level will decrease  Description: Pain level will decrease  Outcome: Met This Shift     Problem: Pain:  Goal: Control of acute pain  Description: Control of acute pain  11/11/2020 2150 by Virgia Land, RN  Outcome: Met This Shift     Problem: Pain:  Goal: Control of chronic pain  Description: Control of chronic pain  Outcome: Met This Shift     Problem: Skin Integrity:  Goal: Will show no infection signs and symptoms  Description: Will show no infection signs and symptoms  11/11/2020 2150 by Virgia Land, RN  Outcome: Met This Shift     Problem: Skin Integrity:  Goal: Absence of new skin breakdown  Description: Absence of new skin breakdown  11/11/2020 2150 by Virgia Land, RN  Outcome: Met This Shift

## 2020-11-11 NOTE — Progress Notes (Signed)
[facility]  SURGICAL INTENSIVE CARE UNIT (SICU)  ATTENDING PHYSICIAN CRITICAL CARE PROGRESS NOTE     I have examined the patient, reviewed the record, and discussed the case with the APN/ resident. Please refer to the APN/ resident's note. I agree with the assessment and plan. I have reviewed all relevant labs and imaging data. The following summarizes my clinical findings and independent assessment.    CC:  Critical care management after fall    Hospital Course/Overnight Events:  3/1: Patient presented after sustaining a fall at home with increasing AMS, per history fall resulted from temporary weakness on R side. He was found to have acute on chronic SDH with 33mm shift and was admitted to ICU.   3/2: Imagining revealed hydronephrosis 2/2 ureteral obstruction, urology consulted. [place] with neurosurgery today. Vascular surgery consulted for IVC filter placement for subsegmental PE  3/3--started on Abx yesterday for positive U/A    Pt confused; mumbling.    Eyes to voice  Oriented to self only  Does not follow commands  Hrt:  Regular rate/rhythm; no murmur  Lungs:  Fairly clear bilaterally  Abd:  Soft; BS active; NT/ND  Skin:  Warm/dry  Ext:  Moving all 4 ext; no edema    Labs personally reviewed    Patient Active Problem List    Diagnosis Date Noted   ??? Acute on chronic intracranial subdural hematoma (HCC) 11/09/2020   ??? Single subsegmental pulmonary embolism without acute cor pulmonale (HCC) 11/09/2020   ??? Delirium 11/09/2020   ??? Slurred speech 11/09/2020   ??? Hydronephrosis with urinary obstruction due to ureteral calculus 11/09/2020   ??? Pleural effusion 11/09/2020   ??? Anemia 05/10/2017   ??? Sepsis (HCC) 05/09/2017   ??? Arthritis of knee, right 05/09/2017   ??? Elevated serum cholesterol    ??? Hyperlipidemia    ??? Acute MI (HCC)        S/p fall  Acute on chronic SDH--s/p burr hole crani--monitor neuro exam; monitor drain output  Hypoalbuminemia/moderate protein calorie malnutrition--check swallow  eval and start po as able or place [place] and start TF  Subsegmental PE--for IVC filter today  Left hydronephrosis from obstructing stone--for cysto with stent placement today  Positive U/A--on Rocephin--await final cultures  PT/OT evals  DVT risk--PCDs    Discussed with and care coordinated with Dr. Priscella Mann    Pt is at risk for neurologic/respiratory/metabolic deterioration and requires ICU care    Iven Finn, MD, [facility]  11/11/2020  7:48 AM      Critical care time exclusive of teaching and procedures = 37 minutes

## 2020-11-11 NOTE — Progress Notes (Signed)
Speech Language Pathology      NAME:  Tyler Rhodes  DOB:  [birth year]  DATE: 11/11/2020  ROOM:  3802/3802-A    Pt NPO for IVC placement. Will hold swallow assessment. To complete as able.     Acute on chronic intracranial subdural hematoma (HCC) [I62.01, I62.03]

## 2020-11-11 NOTE — Progress Notes (Signed)
Physical Therapy  Physical Therapy Attempt    Name: Tyler Rhodes  DOB: 12-05-1928  MRN: [id number]      Date of Service: 11/11/2020  Chart reviewed.  Spoke with RN - pt scheduled for surgery today and does not follow commands per critical care note.  Will re-attempt as able.    Levada Dy, PT, DPT  [phone]

## 2020-11-11 NOTE — Progress Notes (Signed)
SPEECH/LANGUAGE PATHOLOGY  CLINICAL ASSESSMENT OF SWALLOWING FUNCTION   and PLAN OF CARE    PATIENT NAME:  Tyler Rhodes  (male)     MRN:  [id number]    DOB:  [birth year]  (87 y.o.)  STATUS:  Inpatient: Room 3802/3802-A    TODAY'S DATE:  11/11/2020  11/10/20 0400   SLP eval and treat Start: 11/10/20 0400, End: 11/10/20 0400, ONE TIME, Standing Count: 1 Occurrences, R       Duffy Bruce, PA      REASON FOR REFERRAL: Assessment of oropharyngeal swallow function   EVALUATING THERAPIST: Drinda Butts                 RESULTS:    DYSPHAGIA DIAGNOSIS:   limited intake on exam, not able to determine type or severity of dysphagia      Patient was unable to follow directions or answer simple questions. Pt orally defensive during oral care and when presented with a dry spoon. Will continue to follow and assess as cognition and medical status improve.      DIET RECOMMENDATIONS:  NPO       FEEDING RECOMMENDATIONS:     Assistance level:  Not applicable      Compensatory strategies recommended: Not applicable      Discussed recommendations with nursing and/or faxed report to referring provider: Yes    SPEECH THERAPY  PLAN OF CARE   The dysphagia POC is established based on physician order, dysphagia diagnosis and results of clinical assessment     Will continue to follow and assess pt for PO intake tolerance as patient continues to improve cognitively and medically.     Conditions Requiring Skilled Therapeutic Intervention for dysphagia:    Multiple medical issues     Specific dysphagia interventions to include:     ongoing skilled PO analysis to determine if PO diet can be initiated     Specific instructions for next treatment:  ongoing skilled PO analysis to determine if PO diet can be initiated   Patient Treatment Goals:    Short Term Goals:  Pt will participate in ongoing evaluation of swallow function to determine when PO diet can be safely initiated    Long Term Goals:   Pt will maintain adequate nutrition/hydration via PO  intake of the least restrictive oral diet with implementation of safe swallow/ compensatory strategies and decrease signs/symptoms of aspiration to less than 1 x/day.      Patient/family Goal:    Did not state.  Will further assess during treatment.    Plan of care discussed with Patient   The Patient did not demonstrate complete understanding of the diagnosis, prognosis and plan of care     Rehabilitation Potential/Prognosis: guarded                     ADMITTING DIAGNOSIS: Acute on chronic intracranial subdural hematoma (HCC) [I62.01, I62.03]    VISIT DIAGNOSIS:   Visit Diagnoses         Codes    Subdural hemorrhage (HCC)    -  Primary I62.00             PATIENT REPORT/COMPLAINT: patient currently NPO pending results of this evaluation  RN cleared patient for participation in assessment     yes     PRIOR LEVEL OF SWALLOW FUNCTION:    PAST HISTORY OF DYSPHAGIA?: none reported    Home diet: Regular consistency solids (IDDSI level 7) with  thin liquids (  IDDSI level 0)  Current Diet Order:  Diet NPO Exceptions are: Sips of Water with Meds    PROCEDURE:  Consistencies Administered During the Evaluation   Liquids: NA  Solids:  NA      Method of Intake:   NA- attempted dry spoon. Pt orally defensive, biting spoon and not allowing it to enter oral cavity      Position:   Seated, reclined     CLINICAL ASSESSMENT:  Oral Stage:      TBA     Pharyngeal Stage:    TBA    Cognition:   Did not follow commands and Confusion noted    Oral Peripheral Examination   Could not test    Current Respiratory Status    room air     Parameters of Speech Production  Respiration:  Inadequate for speech production  Quality:   Breathy  Intensity: Quiet    Volitional Swallow: not able to elicit     Volitional Cough:   not able to elicit     Pain: No pain reported.    EDUCATION:   The Warehouse manager (SLP) completed education regarding results of evaluation and that intervention is warranted at this time.  Learner: Patient  Education:  Reviewed results and recommendations of this evaluation  Evaluation of Education:  No evidence of learning    This plan may be re-evaluated and revised as warranted.      Evaluation Time includes thorough review of current medical information, gathering information on past medical history/social history and prior level of function, completion of standardized testing/informal observation of tasks, assessment of data and education on plan of care and goals.    [x] The admitting diagnosis and active problem list, have been reviewed prior to initiation of this evaluation.        ACTIVE PROBLEM LIST:   Patient Active Problem List   Diagnosis    Elevated serum cholesterol    Hyperlipidemia    Acute MI (HCC)    Sepsis (HCC)    Arthritis of knee, right    Anemia    Acute on chronic intracranial subdural hematoma (HCC)    Single subsegmental pulmonary embolism without acute cor pulmonale (HCC)    Delirium    Slurred speech    Hydronephrosis with urinary obstruction due to ureteral calculus    Pleural effusion         CPT code:   bedside swallow eval    INTERVENTION  CPT Code: 93716  dysphagia tx    Oral stimulation completed. Pt orally defensive- biting spoon, tightening lips, turning head away, and stating "No!" repeatedly. SLP was able to conduct extensive oral care; copious, thick secretions removed from soft palate, base of tongue and PPW. RN present to assist.         [id number] B.S.  Speech Language Pathology Graduate Student

## 2020-11-11 NOTE — Progress Notes (Signed)
Inserted a small bowel feeding tube 95cm using Envue guidance system. Bridled and xray ordered

## 2020-11-11 NOTE — Other (Signed)
Patient thrashes in bed, and reaches for and pulls on vital lines and drain during assessment despite redirection and education. Bilateral soft wrist restraints will be maintained for patient safety. Will continue to monitor.

## 2020-11-11 NOTE — Progress Notes (Signed)
OCCUPATIONAL THERAPY    Date:11/11/2020  Patient Name: Tyler Rhodes  MRN: [id number]  DOB: [birth year]  Room: 3802/3802-A              Chart reviewed. Pt scheduled for surgery today; not following commands.   Will re-attempt at later time. Thank you for consult.    Charlie Pitter, OTR/L [phone]

## 2020-11-11 NOTE — Anesthesia Pre-Procedure Evaluation (Signed)
Department of Anesthesiology  Preprocedure Note       Name:  Tyler Rhodes   Age:  87 y.o.  DOB:  [birth year]                                          MRN:  [id number]         Date:  11/11/2020      Surgeon: Juliann Mule):  Trena Platt, MD    Procedure: Procedure(s):  CYSTOSCOPY RETROGRADE PYELOGRAM LEFT STENT INSERTION    Medications prior to admission:   Prior to Admission medications    Medication Sig Start Date End Date Taking? Authorizing Provider   bisacodyl (DULCOLAX) 10 MG suppository Place 10 mg rectally daily as needed for Constipation    Historical Provider, MD   isosorbide mononitrate (IMDUR) 120 MG extended release tablet Take 120 mg by mouth daily    Historical Provider, MD   magnesium hydroxide (MILK OF MAGNESIA) 400 MG/5ML suspension Take 30 mLs by mouth daily as needed for Constipation    Historical Provider, MD   pantoprazole (PROTONIX) 40 MG tablet Take 40 mg by mouth daily    Historical Provider, MD   acetaminophen (TYLENOL) 325 MG tablet Take 2 tablets by mouth every 4 hours as needed for Pain or Fever 05/11/17   Kristopher Glee Rostom, MD   aspirin 81 MG chewable tablet Take 81 mg by mouth daily     Historical Provider, MD   clopidogrel (PLAVIX) 75 MG tablet Take 75 mg by mouth daily    Historical Provider, MD   simvastatin (ZOCOR) 40 MG tablet Take 40 mg by mouth nightly     Historical Provider, MD       Current medications:    No current facility-administered medications for this visit.     No current outpatient medications on file.     Facility-Administered Medications Ordered in Other Visits   Medication Dose Route Frequency Provider Last Rate Last Admin   ??? hydrALAZINE (APRESOLINE) injection 10 mg  10 mg IntraVENous Q30 Min PRN Susa Loffler, MD       ??? pantoprazole (PROTONIX) injection 40 mg  40 mg IntraVENous Daily Susa Loffler, MD   40 mg at 11/11/20 0951    And   ??? sodium chloride (PF) 0.9 % injection 10 mL  10 mL IntraVENous Daily Susa Loffler, MD   10 mL at 11/11/20 0947   ??? cefTRIAXone  (ROCEPHIN) 1,000 mg in sterile water 10 mL IV syringe  1,000 mg IntraVENous Q24H Constance Holster, PA   1,000 mg at 11/10/20 1250   ??? sodium chloride flush 0.9 % injection 5-40 mL  5-40 mL IntraVENous 2 times per day Constance Holster, PA   10 mL at 11/11/20 0947   ??? sodium chloride flush 0.9 % injection 5-40 mL  5-40 mL IntraVENous PRN Constance Holster, PA       ??? 0.9 % sodium chloride infusion  25 mL IntraVENous PRN Constance Holster, PA       ??? acetaminophen (TYLENOL) suppository 650 mg  650 mg Rectal Q4H PRN Sophronia Simas, MD   650 mg at 11/10/20 1624   ??? fentaNYL (SUBLIMAZE) injection 25 mcg  25 mcg IntraVENous Q1H PRN Sophronia Simas, MD   25 mcg at 11/10/20 1813   ??? [Held by provider] isosorbide mononitrate (IMDUR) extended release tablet 120  mg  120 mg Oral Daily Constance Holster, [place]       ??? atorvastatin (LIPITOR) tablet 20 mg  20 mg Oral Daily Constance Holster, [place]       ??? ondansetron (ZOFRAN-ODT) disintegrating tablet 4 mg  4 mg Oral Q8H PRN Constance Holster, PA        Or   ??? ondansetron (ZOFRAN) injection 4 mg  4 mg IntraVENous Q6H PRN Constance Holster, PA       ??? polyethylene glycol (GLYCOLAX) packet 17 g  17 g Oral Daily Constance Holster, PA       ??? acetaminophen (TYLENOL) tablet 650 mg  650 mg Oral Q6H Constance Holster, PA       ??? bisacodyl (DULCOLAX) EC tablet 5 mg  5 mg Oral Daily PRN Constance Holster, PA       ??? levETIRAcetam (KEPPRA) 500 mg/100 mL IVPB  500 mg IntraVENous Q12H Constance Holster, PA 400 mL/hr at 11/11/20 0930 500 mg at 11/11/20 0930   ??? 0.9 % sodium chloride infusion   IntraVENous Continuous Constance Holster, PA 75 mL/hr at 11/10/20 2014 New Bag at 11/10/20 2014   ??? labetalol (NORMODYNE;TRANDATE) injection 10 mg  10 mg IntraVENous Q30 Min PRN Constance Holster, PA   10 mg at 11/10/20 2000       Allergies:  No Known Allergies    Problem List:    Patient Active Problem List   Diagnosis Code   ??? Elevated serum cholesterol E78.9   ??? Hyperlipidemia E78.5   ??? Acute MI ([place]) I21.9   ???  Sepsis (HCC) A41.9   ??? 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 Anemia D64.9   ??? Acute on chronic intracranial subdural hematoma (HCC) I62.01, I62.03   ??? Single subsegmental pulmonary embolism without acute cor pulmonale (HCC) I26.93   ??? Delirium R41.0   ??? Slurred speech R47.81   ??? Hydronephrosis with urinary obstruction due to ureteral calculus N13.2   ??? Pleural effusion J90       Past Medical History:        Diagnosis Date   ??? Acute MI ([place])     pt had mi 1997   ??? CAD (coronary artery disease)    ??? Cholesterol serum increased    ??? Hyperlipidemia    ??? Prostate cancer ([place])    ??? Prostate cancer [facility])        Past Surgical History:        Procedure Laterality Date   ??? CARDIAC SURGERY     ??? CORONARY ANGIOPLASTY      pt had a triple by pass 1997   ??? CORONARY ANGIOPLASTY      pt had cat 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   ??? CRANIOTOMY Left 11/10/2020    LEFT CRANIOTOMY BURR HOLESFOR SUBDURAL performed by Larene Beach, MD at [place] History:    Social History     Tobacco Use   ??? Smoking status: Former Smoker     Packs/day: 1.00     Years: 10.00     Pack years: 10.00   ??? Smokeless tobacco: Former Systems developer     Quit date: 05/30/1996   Substance Use Topics   ??? Alcohol use: No  Counseling given: Not Answered      Vital Signs (Current):   There were no vitals filed for this visit.                                           BP Readings from Last 3 Encounters:   11/11/20 (!) 156/64   11/10/20 (!) 98/39   05/11/17 135/67       NPO Status:  > 12 hours                                                                                BMI:   Wt Readings from Last 3 Encounters:   11/10/20 212 lb 11.9 oz (96.5 kg)   06/19/17 225 lb (102.1 kg)   05/09/17 224 lb 6.4 oz (101.8 kg)     There is no height or weight on file to calculate BMI.    CBC:   Lab Results   Component Value Date    WBC 24.3 11/11/2020    RBC 4.42 11/11/2020    HGB 12.8 11/11/2020    HCT 39.5 11/11/2020    MCV 89.4 11/11/2020    RDW 15.3 11/11/2020     PLT 266 11/11/2020       CMP:   Lab Results   Component Value Date    NA 139 11/11/2020    K 3.9 11/11/2020    CL 107 11/11/2020    CO2 21 11/11/2020    BUN 20 11/11/2020    CREATININE 1.2 11/11/2020    GFRAA >60 11/11/2020    LABGLOM 57 11/11/2020    GLUCOSE 126 11/11/2020    GLUCOSE 107 01/08/2011    PROT 6.2 11/11/2020    CALCIUM 8.2 11/11/2020    BILITOT 0.6 11/11/2020    ALKPHOS 80 11/11/2020    AST 21 11/11/2020    ALT 10 11/11/2020       POC Tests: No results for input(s): POCGLU, POCNA, POCK, POCCL, POCBUN, POCHEMO, POCHCT in the last 72 hours.    Coags:   Lab Results   Component Value Date    PROTIME 13.3 11/09/2020    PROTIME 13.0 01/06/2011    INR 1.2 11/09/2020    APTT 27.9 11/09/2020       HCG (If Applicable): No results found for: PREGTESTUR, PREGSERUM, HCG, HCGQUANT     ABGs: No results found for: PHART, PO2ART, PCO2ART, HCO3ART, BEART, O2SATART     Type & Screen (If Applicable):  No results found for: LABABO, LABRH    Drug/Infectious Status (If Applicable):  No results found for: HIV, HEPCAB    COVID-19 Screening (If Applicable):   Lab Results   Component Value Date    COVID19 Not Detected 11/09/2020           Anesthesia Evaluation  Patient summary reviewed and Nursing notes reviewed no history of anesthetic complications:   Airway: Mallampati: Unable to assess / NA  TM distance: >3 FB   Neck ROM: limited  Mouth opening: < 3 FB Dental:      Comment: Unable to assess d/t patient  confusion     Pulmonary:Negative Pulmonary ROS breath sounds clear to auscultation                             Cardiovascular:    (+) past MI:, CAD:,       ECG reviewed  Rhythm: regular  Rate: normal                    Neuro/Psych:   (+) psychiatric history:             ROS comment: Fall, SDH  confusion   S/P Burr hole  Still confused GI/Hepatic/Renal:   (+) GERD:, renal disease: kidney stones,          ROS comment: Probably septic from stone.   Endo/Other:    (+) blood dyscrasia: anticoagulation therapy, arthritis:.,  .                 Abdominal:             Vascular:          Other Findings:               Anesthesia Plan      MAC     ASA 4       Induction: intravenous.      Anesthetic plan and risks discussed with child/children (Telephone consent obtained from daughter).      Plan discussed with attending and CRNA.                  Oscar La, DO   11/11/2020    Patient seen and examined, chart reviewed, agree with above findings.  Anesthetic plan, risks, benefits, alternatives, and personnel involved discussed with patient's daughter by phone.  Patient's daughter verbalized an understanding and agreed to proceed.  NPO status confirmed. Pt. Is confused.    Anesthetic plan discussed with care team members and agreed upon.    Oscar La, DO   11/11/2020  9:58 AM

## 2020-11-11 NOTE — Progress Notes (Signed)
Charlynne Pander RN was given report on room 4523. All questions were answered to her satisfaction. Will call daughter Harriett Sine to update of transfer and room number.

## 2020-11-11 NOTE — Plan of Care (Signed)
Problem: Non-Violent Restraints  Goal: No harm/injury to patient while restraints in use  11/11/2020 0758 by Mickle Plumb, RN  Outcome: Met This Shift     Problem: Non-Violent Restraints  Goal: Patient's dignity will be maintained  11/11/2020 0758 by Mickle Plumb, RN  Outcome: Met This Shift     Problem: Pain:  Goal: Control of acute pain  Description: Control of acute pain  11/11/2020 0758 by Mickle Plumb, RN  Outcome: Met This Shift     Problem: Skin Integrity:  Goal: Will show no infection signs and symptoms  Description: Will show no infection signs and symptoms  11/11/2020 0758 by Mickle Plumb, RN  Outcome: Met This Shift     Problem: Skin Integrity:  Goal: Absence of new skin breakdown  Description: Absence of new skin breakdown  11/11/2020 0758 by Mickle Plumb, RN  Outcome: Met This Shift     Problem: Non-Violent Restraints  Goal: Removal from restraints as soon as assessed to be safe  11/11/2020 0758 by Mickle Plumb, RN  Outcome: Not Met This Shift

## 2020-11-11 NOTE — Progress Notes (Signed)
Department of Neurosurgery  Progress Note    CHIEF COMPLAINT: s/p left burr hole 3/2    SUBJECTIVE:  Awake and alert. C/o headache.     REVIEW OF SYSTEMS :  Constitutional: Negative for chills and fever.    Neurological: Negative for dizziness, tremors and speech change.     OBJECTIVE:   VITALS:  BP (!) 130/55    Pulse 66    Temp 98.8 ??F (37.1 ??C) (Axillary)    Resp 21    Ht 6\' 2"  (1.88 m)    Wt 212 lb 11.9 oz (96.5 kg)    SpO2 95%    BMI 27.31 kg/m??     PHYSICAL:  Neurologic: Alert and oriented to self only; confused and mumbling words; PERRL  Motor Exam:  Following commands, Motor exam 4/5 on right, 5/5 on left  Sensory:  Sensory intact  Incision c/d/i      DATA:  CBC:   Lab Results   Component Value Date    WBC 24.3 11/11/2020    RBC 4.42 11/11/2020    HGB 12.8 11/11/2020    HCT 39.5 11/11/2020    MCV 89.4 11/11/2020    MCH 29.0 11/11/2020    MCHC 32.4 11/11/2020    RDW 15.3 11/11/2020    PLT 266 11/11/2020    MPV 11.2 11/11/2020     BMP:    Lab Results   Component Value Date    NA 139 11/11/2020    K 3.9 11/11/2020    CL 107 11/11/2020    CO2 21 11/11/2020    BUN 20 11/11/2020    LABALBU 2.9 11/11/2020    LABALBU 4.5 01/06/2011    CREATININE 1.2 11/11/2020    CALCIUM 8.2 11/11/2020    GFRAA >60 11/11/2020    LABGLOM 57 11/11/2020    GLUCOSE 126 11/11/2020    GLUCOSE 107 01/08/2011     PT/INR:    Lab Results   Component Value Date    PROTIME 13.3 11/09/2020    PROTIME 13.0 01/06/2011    INR 1.2 11/09/2020     PTT:    Lab Results   Component Value Date    APTT 27.9 11/09/2020   [APTT}    Current Inpatient Medications  Current Facility-Administered Medications: hydrALAZINE (APRESOLINE) injection 10 mg, 10 mg, IntraVENous, Q30 Min PRN  pantoprazole (PROTONIX) injection 40 mg, 40 mg, IntraVENous, Daily **AND** sodium chloride (PF) 0.9 % injection 10 mL, 10 mL, IntraVENous, Daily  cefTRIAXone (ROCEPHIN) 1,000 mg in sterile water 10 mL IV syringe, 1,000 mg, IntraVENous, Q24H  sodium chloride flush 0.9 % injection 5-40  mL, 5-40 mL, IntraVENous, 2 times per day  sodium chloride flush 0.9 % injection 5-40 mL, 5-40 mL, IntraVENous, PRN  0.9 % sodium chloride infusion, 25 mL, IntraVENous, PRN  acetaminophen (TYLENOL) suppository 650 mg, 650 mg, Rectal, Q4H PRN  fentaNYL (SUBLIMAZE) injection 25 mcg, 25 mcg, IntraVENous, Q1H PRN  [Held by provider] isosorbide mononitrate (IMDUR) extended release tablet 120 mg, 120 mg, Oral, Daily  atorvastatin (LIPITOR) tablet 20 mg, 20 mg, Oral, Daily  ondansetron (ZOFRAN-ODT) disintegrating tablet 4 mg, 4 mg, Oral, Q8H PRN **OR** ondansetron (ZOFRAN) injection 4 mg, 4 mg, IntraVENous, Q6H PRN  polyethylene glycol (GLYCOLAX) packet 17 g, 17 g, Oral, Daily  acetaminophen (TYLENOL) tablet 650 mg, 650 mg, Oral, Q6H  bisacodyl (DULCOLAX) EC tablet 5 mg, 5 mg, Oral, Daily PRN  levETIRAcetam (KEPPRA) 500 mg/100 mL IVPB, 500 mg, IntraVENous, Q12H  0.9 % sodium chloride infusion, ,  IntraVENous, Continuous  labetalol (NORMODYNE;TRANDATE) injection 10 mg, 10 mg, IntraVENous, Q30 Min PRN    ASSESSMENT:   s/p left burr hole 3/2  UTI    PLAN:  -Continue ICU care  -Head CT scan this AM  -Serial neurological exams  -No AC or AP      Electronically signed by Wenda Low, PA-C on 11/11/2020 at 7:55 AM

## 2020-11-12 ENCOUNTER — Encounter

## 2020-11-12 LAB — CBC WITH AUTO DIFFERENTIAL
Basophils %: 0.2 % (ref 0.0–2.0)
Basophils Absolute: 0 E9/L (ref 0.00–0.20)
Eosinophils %: 0.2 % (ref 0.0–6.0)
Eosinophils Absolute: 0 E9/L — ABNORMAL LOW (ref 0.05–0.50)
Hematocrit: 38.4 % (ref 37.0–54.0)
Hemoglobin: 12.1 g/dL — ABNORMAL LOW (ref 12.5–16.5)
Lymphocytes %: 15.6 % — ABNORMAL LOW (ref 20.0–42.0)
Lymphocytes Absolute: 3.52 E9/L (ref 1.50–4.00)
MCH: 28.6 pg (ref 26.0–35.0)
MCHC: 31.5 % — ABNORMAL LOW (ref 32.0–34.5)
MCV: 90.8 fL (ref 80.0–99.9)
MPV: 11.8 fL (ref 7.0–12.0)
Metamyelocytes Relative: 0.9 % (ref 0.0–1.0)
Monocytes %: 5.2 % (ref 2.0–12.0)
Monocytes Absolute: 1.1 E9/L — ABNORMAL HIGH (ref 0.10–0.95)
Neutrophils %: 78.3 % (ref 43.0–80.0)
Neutrophils Absolute: 17.38 E9/L — ABNORMAL HIGH (ref 1.80–7.30)
Platelets: 219 E9/L (ref 130–450)
RBC: 4.23 E12/L (ref 3.80–5.80)
RDW: 15.8 fL — ABNORMAL HIGH (ref 11.5–15.0)
WBC: 22 E9/L — ABNORMAL HIGH (ref 4.5–11.5)

## 2020-11-12 LAB — COMPREHENSIVE METABOLIC PANEL W/ REFLEX TO MG FOR LOW K
ALT: 7 U/L (ref 0–40)
AST: 14 U/L (ref 0–39)
Albumin: 2.9 g/dL — ABNORMAL LOW (ref 3.5–5.2)
Alkaline Phosphatase: 84 U/L (ref 40–129)
Anion Gap: 9 mmol/L (ref 7–16)
BUN: 23 mg/dL (ref 6–23)
CO2: 25 mmol/L (ref 22–29)
Calcium: 8 mg/dL — ABNORMAL LOW (ref 8.6–10.2)
Chloride: 107 mmol/L (ref 98–107)
Creatinine: 1.1 mg/dL (ref 0.7–1.2)
GFR African American: >60
GFR Non-African American: >60 mL/min/{1.73_m2} (ref >=60)
Glucose: 101 mg/dL — ABNORMAL HIGH (ref 74–99)
Potassium reflex Magnesium: 3.8 mmol/L (ref 3.5–5.0)
Sodium: 141 mmol/L (ref 132–146)
Total Bilirubin: 0.3 mg/dL (ref 0.0–1.2)
Total Protein: 6 g/dL — ABNORMAL LOW (ref 6.4–8.3)

## 2020-11-12 LAB — PHOSPHORUS: Phosphorus: 3 mg/dL (ref 2.5–4.5)

## 2020-11-12 LAB — CALCIUM, IONIZED: Calcium, Ionized: 1.24 mmol/L (ref 1.15–1.33)

## 2020-11-12 LAB — MAGNESIUM: Magnesium: 2.4 mg/dL (ref 1.6–2.6)

## 2020-11-12 MED ORDER — POLYETHYLENE GLYCOL 3350 17 G PO PACK
17 g | Freq: Every day | ORAL | Status: DC
Start: 2020-11-12 — End: 2020-11-15

## 2020-11-12 MED ORDER — LEVETIRACETAM 100 MG/ML PO SOLN
100 MG/ML | Freq: Two times a day (BID) | ORAL | Status: DC
Start: 2020-11-12 — End: 2020-11-15
  Administered 2020-11-13 – 2020-11-15 (×5): via ORAL

## 2020-11-12 MED ORDER — LABETALOL HCL 5 MG/ML IV SOLN
5 MG/ML | INTRAVENOUS | Status: DC | PRN
Start: 2020-11-12 — End: 2020-11-12

## 2020-11-12 MED ORDER — HYDRALAZINE HCL 20 MG/ML IJ SOLN
20 MG/ML | INTRAMUSCULAR | Status: DC | PRN
Start: 2020-11-12 — End: 2020-11-12
  Administered 2020-11-12: 16:00:00 via INTRAVENOUS

## 2020-11-12 MED ORDER — ATORVASTATIN CALCIUM 20 MG PO TABS
20 MG | Freq: Every day | ORAL | Status: DC
Start: 2020-11-12 — End: 2020-11-15
  Administered 2020-11-13 – 2020-11-14 (×2): via NASOGASTRIC

## 2020-11-12 MED ORDER — LANSOPRAZOLE 3 MG/ML PO SUSP
3 MG/ML | Freq: Every day | ORAL | Status: DC
Start: 2020-11-12 — End: 2020-11-15
  Administered 2020-11-13 – 2020-11-15 (×2): via NASOGASTRIC

## 2020-11-12 MED ORDER — BISACODYL 10 MG RE SUPP
10 MG | Freq: Every day | RECTAL | Status: DC | PRN
Start: 2020-11-12 — End: 2020-11-23

## 2020-11-12 MED ORDER — ACETAMINOPHEN 325 MG PO TABS
325 MG | Freq: Four times a day (QID) | ORAL | Status: DC
Start: 2020-11-12 — End: 2020-11-12
  Administered 2020-11-12: 22:00:00 via NASOGASTRIC

## 2020-11-12 MED FILL — MAPAP 325 MG PO TABS: 325 mg | ORAL | Qty: 2

## 2020-11-12 MED FILL — LEVETIRACETAM IN NACL 500 MG/100ML IV SOLN: 500 MG/100ML | INTRAVENOUS | Qty: 100

## 2020-11-12 MED FILL — ATORVASTATIN CALCIUM 20 MG PO TABS: 20 MG | ORAL | Qty: 1

## 2020-11-12 MED FILL — PANTOPRAZOLE SODIUM 40 MG IV SOLR: 40 mg | INTRAVENOUS | Qty: 40

## 2020-11-12 MED FILL — BD POSIFLUSH 0.9 % IV SOLN: 0.9 % | INTRAVENOUS | Qty: 30

## 2020-11-12 MED FILL — CEFTRIAXONE SODIUM 1 G IJ SOLR: 1 g | INTRAMUSCULAR | Qty: 1000

## 2020-11-12 MED FILL — MAPAP 325 MG PO TABS: 325 MG | ORAL | Qty: 2

## 2020-11-12 MED FILL — LANSOPRAZOLE 3 MG/ML PO SUSP: 3 mg/mL | ORAL | Qty: 90

## 2020-11-12 MED FILL — HYDRALAZINE HCL 20 MG/ML IJ SOLN: 20 mg/mL | INTRAMUSCULAR | Qty: 1

## 2020-11-12 NOTE — Progress Notes (Signed)
[facility]  SURGICAL INTENSIVE CARE UNIT (SICU)  ATTENDING PHYSICIAN CRITICAL CARE PROGRESS NOTE     I have examined the patient, reviewed the record, and discussed the case with the APN/ resident. Please refer to the APN/ resident's note. I agree with the assessment and plan. I have reviewed all relevant labs and imaging data. The following summarizes my clinical findings and independent assessment.    CC:  Critical care management after fall    Hospital Course/Overnight Events:  3/1: Patient presented after sustaining a fall at home with increasing AMS, per history fall resulted from temporary weakness on R side. He was found to have acute on chronic SDH with 73mm shift and was admitted to ICU.   3/2: Imagining revealed hydronephrosis 2/2 ureteral obstruction, urology consulted. Burrholes with neurosurgery today. Vascular surgery consulted for IVC filter placement for subsegmental PE  3/3--started on Abx yesterday for positive U/A    Pt confused.    Eyes to voice  Oriented to self only  Does not follow commands  Hrt:  Regular rate/rhythm; no murmur  Lungs:  Fairly clear bilaterally  Abd:  Soft; BS active; NT/ND  Skin:  Warm/dry  Ext:  Moving all 4 ext; no edema    Labs personally reviewed    Patient Active Problem List    Diagnosis Date Noted   ??? Subdural hemorrhage (HCC)    ??? Palliative care by specialist    ??? Acute on chronic intracranial subdural hematoma (HCC) 11/09/2020   ??? Single subsegmental pulmonary embolism without acute cor pulmonale (HCC) 11/09/2020   ??? Delirium 11/09/2020   ??? Slurred speech 11/09/2020   ??? Hydronephrosis with urinary obstruction due to ureteral calculus 11/09/2020   ??? Pleural effusion 11/09/2020   ??? Anemia 05/10/2017   ??? Sepsis (HCC) 05/09/2017   ??? Arthritis of knee, right 05/09/2017   ??? Elevated serum cholesterol    ??? Hyperlipidemia    ??? Acute MI (HCC)        S/p fall  Acute on chronic SDH--s/p burr hole crani--monitor neuro exam; monitor drain  output  Hypoalbuminemia/moderate protein calorie malnutrition--cont TF  Subsegmental PE--s/p IVC filter   Left hydronephrosis from obstructing stone--s/p cysto with stent placement today  Positive U/A--on Rocephin--await final cultures  PT/OT evals  DVT risk--PCDs    Discussed with and care coordinated with Dr. Priscella Mann    Pt is at risk for neurologic/respiratory/metabolic deterioration and requires ICU care    Iven Finn, MD, FACS  11/12/2020  2:03 PM      Critical care time exclusive of teaching and procedures = 36 minutes

## 2020-11-12 NOTE — Progress Notes (Signed)
When removing bilateral wrist restraints for PROM, patient continues to pull at lines and corpak. Unable to redirect patient. Bilateral soft wrist restraints reapplied for patient's safety at this time.

## 2020-11-12 NOTE — Care Coordination-Inpatient (Signed)
11/12/2020 - admitted for acute on chronic subdural hematoma. S/P 3/2 Left fronto/temporal burr hole and subdural drain placement. S/P 3/3 vena cava filter placement. S/P cysto with left stent insertion. Neurosurgery, urologyfollowing.  IV rocephin q24h, IV LR @ 125 cc/hr. Envue feeding tube with tube feedings ordered. On 2L NC. Discharge plan is to return to [facility] [place]. Will need updated therapy evals, precert and covid test with in 24 hours of discharge. Ambulance form completed and placed in envelope on soft chart. SW/CM will follow.

## 2020-11-12 NOTE — Plan of Care (Signed)
Problem: Non-Violent Restraints  Goal: No harm/injury to patient while restraints in use  11/12/2020 1733 by Sherry Ruffing, RN  Outcome: Met This Shift     Problem: Non-Violent Restraints  Goal: Patient's dignity will be maintained  11/12/2020 1733 by Sherry Ruffing, RN  Outcome: Met This Shift     Problem: Non-Violent Restraints  Goal: Removal from restraints as soon as assessed to be safe  11/12/2020 1733 by Sherry Ruffing, RN  Outcome: Not Met This Shift

## 2020-11-12 NOTE — Progress Notes (Addendum)
Palliative Care Department  Progress Note  Provider: Louellen Molder, APRN - CNS  [phone]    Hospital Day: 4  Date of Initial Consult: 11/09/20  Referring Provider: Dr. Orma Render  Palliative Medicine was consulted for assistance with: Geriatric Trauma    Chief Complaint: Tyler Rhodes is a 87 y.o. male with chief complaint of fall, ICH    HPI:   Tyler Rhodes is a 87 y.o. male with significant medical history of CAD, who was admitted on 11/09/2020 after he reportedly developed right-sided weakness and aphasia, followed by a fall during which he reportedly hit his head.  He was originally seen at [facility], upper was transferred to [facility] after he was found to have a subacute on chronic subdural hematoma.  Seen by neurosurgery underwent bur hole craniotomy with subdural drain placement.  Incidentally was found to have possible subsegmental PE as well as left hydronephrosis with a left distal ureteral calculi.  He is being followed by vascular and urology and is planned to undergo IVC filter placement and cystoscopy.  Palliative service has been consulted as per the geriatric trauma protocol.    ASSESSMENT/PLAN:     Pertinent Hospital Diagnoses:  Current medical issues leading to Palliative Medicine involvement include   Active Hospital Problems    Diagnosis Date Noted   ??? Acute on chronic intracranial subdural hematoma (HCC) [I62.01, I62.03] 11/09/2020   ??? Single subsegmental pulmonary embolism without acute cor pulmonale (HCC) [I26.93] 11/09/2020   ??? Delirium [R41.0] 11/09/2020   ??? Slurred speech [R47.81] 11/09/2020   ??? Hydronephrosis with urinary obstruction due to ureteral calculus [N13.2] 11/09/2020   ??? Pleural effusion [J90] 11/09/2020     Palliative Care Encounter / Counseling Regarding Goals of Care:  Please see detailed goals of care discussion as below.  ??? At this time, Tyler Rhodes, Does Not have capacity for medical decision-making.  Capacity is time limited and  situation/question specific.  ??? During encounter patient's daughter Tyler Rhodes was surrogate medical decision-maker  ??? Outcome of goals of care meeting: continue current care; return to [facility]; awaiting therapy results  ??? Code status: Limited DO NOT INTUBATE, yes for chest compressions, defibrillation, resuscitative medications   o Per notes: daughter reported pt DNR CC at the skilled nursing facility   o Would discuss further if needed;  o For this hospitalization recommend DNR CCA status DO NOT INTUBATE status, the daughter will consider these recommendations  ??? Advanced Directives: Living Will and HC-POA  ??? Surrogate/Legal NOK:   Tyler Rhodes ([id number])  patient's daughter  ??? Will follow and continued to discuss goals of care pending clinical progression.    Referrals: none today    SUBJECTIVE:   Events/Discussions:  3/4 Pt seen. Chart reviewed. Discussed with APRN. Pt awake "saying ok" to all questions. NG in place. SPO2 99%. Has subdural drain in place. No new acute events overnight. Moving all extremities and follows commands. Feeding tube in place. Continues on keppra. Had IVC filter placed 3/3-no issues noted. Attempted to make contact with daughter, Tyler Rhodes x 2 (message-out of service). Informed staff to contact palliative medicine for any needs. Goal at present-continue current post crani care.     3/2 per Ancil Linsey  Changes seen at the bedside, no family present.  He recently turned from his bur hole craniotomy, he is alert, however is confused and restless at this time.  I did speak with the patient's daughter by telephone, providing support, we discussed goals of  care and CODE STATUS.  She states that he was a DNR CC at the skilled nursing facility, and this was done at the recommendation of the provider at the skilled nursing facility.  She states that she knows that he would not wish to be placed on life support, and thus he is a DO NOT INTUBATE.  She is open to further conversation regarding goals of  care and CODE STATUS pending his further clinical progression, would like to discuss this further after his procedures tomorrow.    No Known Allergies      OBJECTIVE:   Prognosis: unknown    Physical Exam:  BP (!) 148/94    Pulse 65    Temp 97.4 ??F (36.3 ??C) (Axillary)    Resp 21    Ht 6\' 2"  (1.88 m)    Wt 220 lb 3.2 oz (99.9 kg)    SpO2 99%    BMI 28.27 kg/m??     Gen: Elderly, eyes open; aphasic, follows commands  HEENT:  Normocephalic, conjunctiva pink, burr hole drain in place  Neck:  Supple  Lungs:  CTA bilaterally but diminished;   Heart: RRR  Abd:  Soft, non tender, non distended, BS+  M/S/Ext:  Moving all extremities, no edema, pulses present  GU: Foley catheter in place  Skin:  Warm and dry  Neuro:  PERRL; following commands; MAE    Objective data reviewed: labs, images, records, medication use, vitals and chart    Time/Communication:  Greater than 50% of time spent, total 15 minutes in counseling and coordination of care at the bedside regarding goals of care and see above.    , APRN - CNS  Palliative Medicine    Patient and the plan of care discussed with the other IDT members of Palliative Care Team, and with consultants, Primary Attending, patient, family and floor nurse, as appropriate and available.    Thank you for allowing Palliative Medicine to participate in the care of Tyler Rhodes.    Note: This report was completed using computerize voiced recognition software.  Every effort has been made to ensure accuracy; however, inadvertent computerized transcription errors may be present.

## 2020-11-12 NOTE — Plan of Care (Signed)
Problem: Non-Violent Restraints  Goal: No harm/injury to patient while restraints in use  11/12/2020 2256 by Darlyn Chamber, RN  Outcome: Met This Shift  11/12/2020 1733 by Sherry Ruffing, RN  Outcome: Met This Shift  11/12/2020 0930 by Sherry Ruffing, RN  Outcome: Met This Shift  Goal: Patient's dignity will be maintained  11/12/2020 2256 by Darlyn Chamber, RN  Outcome: Met This Shift  11/12/2020 1733 by Sherry Ruffing, RN  Outcome: Met This Shift  11/12/2020 0930 by Sherry Ruffing, RN  Outcome: Met This Shift     Problem: Non-Violent Restraints  Goal: Removal from restraints as soon as assessed to be safe  11/12/2020 2256 by Darlyn Chamber, RN  Outcome: Not Met This Shift  11/12/2020 1733 by Sherry Ruffing, RN  Outcome: Not Met This Shift  11/12/2020 0930 by Sherry Ruffing, RN  Outcome: Not Met This Shift

## 2020-11-12 NOTE — Progress Notes (Signed)
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 Unable to obtain subjective history due to cognition.     OBJECTIVE:   Initial Evaluation  Date: 11/12/20 Treatment Short Term/ Long Term   Goals   AM-PAC 6 Clicks 0/16     Was pt agreeable to Eval/treatment? Yes     Does pt have pain? No signs of pain      Bed Mobility  Rolling: NT  Supine to sit: Dep x 2 with HOB elevated   Sit to supine: Dep x 2  Scooting: Dep   ModA   Transfers Sit to stand: MaxA  Stand to sit: MaxA  Stand pivot: NT  ModA   Ambulation   A few sidesteps with ModA x 2 and HHA  >50 feet with ModA and AAD    Stair negotiation: ascended and descended NT  >2 steps with 1 rail ModA   ROM BUE:  Defer to OT note  BLE:  WFL     Strength BUE:  Defer to OT note  B knee extension: 4-/5  B Plantarflexion: 4/5  Increase by 1/3 MMT grade   Balance Sitting EOB:  ModA dynamic, MinA static   Dynamic Standing:  ModA x 2 and HHA   Sitting EOB:  SBA  Dynamic Standing:  ModA with AAD     Pt is A & O x 1, self   CAM-ICU: NT  RASS: -1  Sensation:  Unable to formally assess   Edema:  None     Vitals:  Heart Rate at rest 66 bpm Heart Rate post session 69 bpm   SpO2 at rest -% SpO2 post session -%   Blood Pressure at rest 125/57 mmHg Blood Pressure post session 131/91 mmHg       Functional Status  Score-Intensive Care Unit (FSS-ICU)   Rolling -/7   Supine to sit transfer 1/7   Unsupported sitting  3/7   Sit to stand transfers 2/7   Ambulation 1/7   Total  7/35       Therapeutic Exercises:  NA    Patient education  Pt educated on safety and role of PT     Patient response to education:   Pt verbalized understanding Pt demonstrated skill Pt requires further education in this area   Partial  Partial  Yes     ASSESSMENT:    Conditions Requiring Skilled Therapeutic Intervention:    _0 Decreased strength     _1 Decreased ROM  _2 Decreased functional mobility  _3 Decreased balance   _4 Decreased endurance   _5 Decreased posture  _6 Decreased sensation  _7 Decreased coordination   _8 Decreased vision  _9 Decreased safety awareness   _10 Increased pain       Comments:  RN reported pt was medically stable.  Pt was in  bed upon arrival, agreeable to initial evaluation. Pt was lethargic and was able to follow most commands throughout evaluation. Pt responded best to simple commands. Total assistance was provided to reach EOB. Increased assistance provided through hip to initiate sit to stand transfer from bed. Pt had a forward flexed posture that improved with verbal and tactile cueing. Pt was able to take a few short, unsteady side steps. Fatigue limited activity and pt was returned to bed.  Pt was left in bed with all needs met and call light in reach.  All lines remained intact and bed alarm on.    Treatment:  Patient practiced and was instructed in the following treatment:    ??? Bed mobility training - pt given verbal and tactile cues to facilitate proper sequencing and safety during  supine>sit as well as provided with physical assistance.  ??? Sitting EOB for >8 minutes for upright tolerance, postural awareness and BLE ROM  ??? Transfer training - pt was given verbal and tactile cues to facilitate proper hand placement, technique and safety during sit to stand, stand to sit  as well as provided with physical assistance.  ??? Gait  training- pt was given verbal and tactile cues to facilitate safety and balance during ambulation as well as provided with physical assistance.    Pt's/ family goals   1. Return home     Prognosis is good for reaching above PT goals.    Patient and or family understand(s) diagnosis, prognosis, and plan of care.  Yes    PHYSICAL THERAPY PLAN OF CARE:    PT POC is established based on physician order and patient diagnosis     Referring provider/PT Order:  Constance Holster, PA /11/09/20 2200 PT eval and treat  Diagnosis:  Acute on chronic intracranial subdural hematoma (HCC) [I62.01, I62.03]  Specific instructions for next treatment:  Progress activity    Current Treatment Recommendations:     _0  Strengthening to improve independence with functional mobility   _1  ROM to improve independence with functional mobility   _2  Balance Training to improve static/dynamic balance and to reduce fall risk  _3  Endurance Training to improve activity tolerance during functional mobility   _4  Transfer Training to improve safety and independence with all functional transfers   _5  Gait Training to improve gait mechanics, endurance and asses need for appropriate assistive device  _6  Stair Training in preparation for safe discharge home and/or into the community   _7  Positioning to prevent skin breakdown and contractures  _8  Safety and Education Training   _9  Patient/Caregiver Education   _10  HEP  _11  Other     PT long term treatment goals are located in above grid    Frequency of treatments: 2-5x/week x 1-2 weeks.    Time in  1335  Time out  1415    Total Treatment Time  25 minutes     Evaluation Time includes thorough review of current medical information, gathering information on past medical history/social history and prior level of function, completion of standardized testing/informal observation of tasks, assessment of data and education on plan of care and goals.    CPT codes:  _12  Low Complexity PT evaluation 97162  _13   Moderate Complexity PT evaluation 97162  _14  High Complexity PT evaluation 97163  _15  PT Re-evaluation 97164  _16  Gait training 97116 - minutes  _17  Manual therapy 97140 - minutes  _18  Therapeutic activities 97530 25 minutes  _19  Therapeutic exercises 97110 - minutes  _20  Neuromuscular reeducation  02637 - minutes     Audie Box, SPT   Hilbert Corrigan, [place], [place]  [id number]

## 2020-11-12 NOTE — Other (Signed)
Patient attempting to pull on lines and tubes, unable to redirect at this time. Soft bilateral wrist restraints remain in place for patients safety.

## 2020-11-12 NOTE — Progress Notes (Signed)
11/12/2020 2:52 PM  Service: Urology  Group: [facility] ([facility])    MAC DOWDELL  [id number]    Subjective:    He is awake and alert today  Answers questions   Has some confusion   In bilateral soft wrist restraints  Foley draining yellow urine     Review of Systems  Unable to obtain due to confusion       Scheduled Meds:  ??? [START ON 11/13/2020] atorvastatin  20 mg Per NG tube Daily   ??? levETIRAcetam  500 mg Oral BID   ??? [START ON 11/13/2020] lansoprazole  15 mg Per NG tube QAM AC   ??? [START ON 11/13/2020] polyethylene glycol  17 g Per NG tube Daily   ??? acetaminophen  650 mg Per NG tube Q6H   ??? sodium chloride flush  5-40 mL IntraVENous 2 times per day   ??? cefTRIAXone (ROCEPHIN) IV  1,000 mg IntraVENous Q24H   ??? [Held by provider] isosorbide mononitrate  120 mg Oral Daily       Objective:  Vitals:    11/12/20 1304   BP:    Pulse: 63   Resp: 16   Temp:    SpO2:          Allergies: Patient has no known allergies.    General Appearance: awake and alert, confused, no acute distress   Skin: no rash or erythema  Head: normocephalic, sutures intact   Pulmonary/Chest: normal air movement, no respiratory distress  Abdomen: soft, non-tender, non-distended  Genitourinary: foley draining yellow urine   Extremities: no cyanosis, clubbing or edema         Labs:     Recent Labs     11/12/20  0505   NA 141   K 3.8   CL 107   CO2 25   BUN 23   CREATININE 1.1   GLUCOSE 101*   CALCIUM 8.0*       Lab Results   Component Value Date    HGB 12.1 11/12/2020    HCT 38.4 11/12/2020       Lab Results   Component Value Date    PSA 5.73 (H) 12/01/2014               Assessment/Plan:  POD#1 cystoscopy, left stent insertion   Left distal ureteral calculi   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

## 2020-11-12 NOTE — Progress Notes (Signed)
Intensive Care Unit  Critical Care   Daily Progress Note 11/12/2020    Date of Admission: 11/09/2020     CC: Critical Care management s/p burr hole and subdural drain placement     Hospital Course/ Overnight Events:  87 year old male presented from [facility] with concern for SDH. Working with PT in am patient developed R sided weakness and aphasia. Reportedly fell at the nursing facility. Hx: ASA and Plavix for CAD. CT scan at [facility] showed Left-sided subacute on chronic subdural hematoma.  3/2 Left fronto/temporal burr hole and subdural drain placement   Urology consulted for Left hydronephrosis, left distal ureteral 7 mm calculi. Vascular consulted for IVF filter placement d/t possible subsegmental PE.   3/3 Cystoscopy and left stent insertion. Vascular placed IVC filter.   3/4 No acute events overnight. Speech worked with patient, NPO/Tube feed. NSGY going to remove drain today d/t minimal output. Spoke with Urology;  Rocephin x 7 days.     PHYSICAL EXAM:    BP (!) 148/94    Pulse 65    Temp 97.4 ??F (36.3 ??C) (Axillary)    Resp 21    Ht 6\' 2"  (1.88 m)    Wt 220 lb 3.2 oz (99.9 kg)    SpO2 99%    BMI 28.27 kg/m??     Intake/Output Summary (Last 24 hours) at 11/12/2020 1005  Last data filed at 11/12/2020 0825  Gross per 24 hour   Intake 2535 ml   Output 1118 ml   Net 1417 ml      General appearance:  Comfortable.   GCS:  13  3- Opened eyes per voice, command   6 - Follows simple motor commands  4 - Seems confused, disoriented- self     Pupil size: Left 3 mm  Right 3 mm  Pupil reaction: Yes  Wiggles fingers: Left No Right No  Hand grasp:   Left decreased     Right decreased  Wiggles toes: Left No   Right No  Plantar flexion: Left decreased   Right decreased    CONSTITUTIONAL: No acute distress, lying in hospital bed.   NEUROLOGIC: PERRL, oriented to self   CARDIOVASCULAR: S1 S2, regular rate, regular rhythm, no murmur/gallop/rub. Monitor: SR  PULMONARY: Bilaterally clear, diminished. No rhonchi/rales/wheezes, no use of  accessory muscles . 2 L NC  RENAL: Foley to gravity, cloudy yellow urine.  ABDOMEN: soft, nontender, nondistended, nontympanic, normal bowel sounds; 3/3 BM x 3    MUSCULOSKELETAL: moves all extremities purposefully, intermittent to command   SKIN/EXTREMITIES: no rashes/ecchymosis, no edema/clubbing, warm/dry, good capillary refill     LINES: PIV    CONSULTS:   Medicine  Neurosurgery  Vascular  Urology    Past Medical History:   Diagnosis Date   ??? Acute MI (HCC)     pt had mi 1997   ??? CAD (coronary artery disease)    ??? Cholesterol serum increased    ??? Hyperlipidemia    ??? Prostate cancer (HCC)    ??? Prostate cancer (HCC)        ASSESSMENT/PLAN:       ?? Neuro:  Hx: Monitor neuro status, Neurosurgery following, Keppra for seizure prophylaxis.   ?? CV: .No acute issues.  Hx of Hyperlipidemia, CAD and MI. Monitor hemodynamics.  BP goal less than 140 mm Hg. PRN hydralazine & labetalol. Statin.    ?? Pulm: No acute issues. Monitor RR & SpO2.   NC oxygen   ?? GI: Dysphasia. Nasogastric.  NPO.  Tube feed  Monitor bowel function. Speech following  ?? Renal: Left hydronephrosis, left distal ureteral calculi s/p Cystoscopy and L stent insertion. Positive UA.  Monitor BUN & Cr. Monitor electrolytes & replace as needed.  Monitor urine output. Urology following.   ?? GQ:[id number]. Monitor WBC. Rocephin x 7 days.   ?? Endocrine: No acute issues. Monitor BS.  ?? MSK: No acute issues. ROM. turn & reposition. PT/OT  ?? Heme: No acute issues.  Hx: Prostate Ca. Monitor CBC.        Bowel regimen: Glycolax  Pain control/Sedation: Fentanyl PRN and Tylenol  DVT prophylaxis: SCD  GI prophylaxis: Tube feeding  Glucose protocol:  ISS  Mouth care: as needed  Foley: Keep in place for critical care monitoring of fluid balance-Urology.    Ancillary consults:  Neurosurgery, Vascular surgery and Urology, Critical Care, Palliative  Patient/Family update:  Questions answered.  Support given.  Will update when available. Spoke with Palliative, family did not  answer phone x 2.   Code status:  Limited no intubation, Yes to CPR, Yes to shock, Yes to resuscitative medications    Disposition: Continue Neuro ICU    Electronically signed by Carlyle Lipa, APRN - CNP on 11/12/2020 at 10:05 AM

## 2020-11-12 NOTE — Progress Notes (Signed)
Department of Neurosurgery  Progress Note    CHIEF COMPLAINT: s/p left burr hole 3/2    SUBJECTIVE:  Awake and alert. No headache    REVIEW OF SYSTEMS :  Constitutional: Negative for chills and fever.    Neurological: Negative for dizziness, tremors and speech change.     OBJECTIVE:   VITALS:  BP (!) 101/45    Pulse 63    Temp 97.9 ??F (36.6 ??C) (Axillary)    Resp 16    Ht 6\' 2"  (1.88 m)    Wt 220 lb 3.2 oz (99.9 kg)    SpO2 95%    BMI 28.27 kg/m??     PHYSICAL:  Neurologic: Alert and oriented to self only and month;   PERRL  Motor Exam:  Following commands, Motor exam 4/5 on right, 5/5 on left  Sensory:  Sensory intact  Incision c/d/i      DATA:  CBC:   Lab Results   Component Value Date    WBC 22.0 11/12/2020    RBC 4.23 11/12/2020    HGB 12.1 11/12/2020    HCT 38.4 11/12/2020    MCV 90.8 11/12/2020    MCH 28.6 11/12/2020    MCHC 31.5 11/12/2020    RDW 15.8 11/12/2020    PLT 219 11/12/2020    MPV 11.8 11/12/2020     BMP:    Lab Results   Component Value Date    NA 141 11/12/2020    K 3.8 11/12/2020    CL 107 11/12/2020    CO2 25 11/12/2020    BUN 23 11/12/2020    LABALBU 2.9 11/12/2020    LABALBU 4.5 01/06/2011    CREATININE 1.1 11/12/2020    CALCIUM 8.0 11/12/2020    GFRAA >60 11/12/2020    LABGLOM >60 11/12/2020    GLUCOSE 101 11/12/2020    GLUCOSE 107 01/08/2011     PT/INR:    Lab Results   Component Value Date    PROTIME 13.3 11/09/2020    PROTIME 13.0 01/06/2011    INR 1.2 11/09/2020     PTT:    Lab Results   Component Value Date    APTT 27.9 11/09/2020   [APTT}    Current Inpatient Medications  Current Facility-Administered Medications: hydrALAZINE (APRESOLINE) injection 10 mg, 10 mg, IntraVENous, Q10 Min PRN  labetalol (NORMODYNE;TRANDATE) injection 10 mg, 10 mg, IntraVENous, Q10 Min PRN  [START ON 11/13/2020] atorvastatin (LIPITOR) tablet 20 mg, 20 mg, Per NG tube, Daily  levETIRAcetam (KEPPRA) 100 MG/ML solution 500 mg, 500 mg, Oral, BID  [START ON 11/13/2020] lansoprazole suspension SUSP 15 mg, 15 mg, Per NG  tube, QAM AC  [START ON 11/13/2020] polyethylene glycol (GLYCOLAX) packet 17 g, 17 g, Per NG tube, Daily  acetaminophen (TYLENOL) tablet 650 mg, 650 mg, Per NG tube, Q6H  bisacodyl (DULCOLAX) suppository 10 mg, 10 mg, Rectal, Daily PRN  sodium chloride flush 0.9 % injection 5-40 mL, 5-40 mL, IntraVENous, 2 times per day  lactated ringers infusion, , IntraVENous, Continuous  cefTRIAXone (ROCEPHIN) 1,000 mg in sterile water 10 mL IV syringe, 1,000 mg, IntraVENous, Q24H  sodium chloride flush 0.9 % injection 5-40 mL, 5-40 mL, IntraVENous, PRN  acetaminophen (TYLENOL) suppository 650 mg, 650 mg, Rectal, Q4H PRN  fentaNYL (SUBLIMAZE) injection 25 mcg, 25 mcg, IntraVENous, Q1H PRN  [Held by provider] isosorbide mononitrate (IMDUR) extended release tablet 120 mg, 120 mg, Oral, Daily  ondansetron (ZOFRAN-ODT) disintegrating tablet 4 mg, 4 mg, Oral, Q8H PRN **OR** ondansetron (ZOFRAN) injection  4 mg, 4 mg, IntraVENous, Q6H PRN    ASSESSMENT:   s/p left burr hole 3/2,   UTI    PLAN:  -Continue ICU care  -subdural drain removed  -ok to raise HOB to comfort  -Serial neurological exams  -No AC or AP  - ok to begin proph lovenox on 3/5      Electronically signed by Duffy Bruce, PA on 11/12/2020 at 2:59 PM

## 2020-11-12 NOTE — Plan of Care (Signed)
Problem: Non-Violent Restraints  Goal: No harm/injury to patient while restraints in use  11/12/2020 0930 by Sherry Ruffing, RN  Outcome: Met This Shift     Problem: Non-Violent Restraints  Goal: Patient's dignity will be maintained  11/12/2020 0930 by Sherry Ruffing, RN  Outcome: Met This Shift     Problem: Non-Violent Restraints  Goal: Removal from restraints as soon as assessed to be safe  11/12/2020 0930 by Sherry Ruffing, RN  Outcome: Not Met This Shift

## 2020-11-12 NOTE — Other (Signed)
Patients daughter, Nancy,notified of new bed placement on 4500.

## 2020-11-12 NOTE — Progress Notes (Signed)
OCCUPATIONAL THERAPY INITIAL EVALUATION    [facility]  [facility]  [address], [place], [place]       Date:11/12/2020                                                               Patient Name: Tyler Rhodes  MRN: [id number]  DOB: 07-06-1929  Room: 4523/4523-A    Evaluating OT: Charlie PitterMaria Makenzi Bannister, OTR/L [phone]    Referring Provider: Renee RivalStephanie Gao, MD   Specific Provider Orders/Date: OT eval and treat (11/09/20)          Diagnosis: Left subdural hematoma  ??            UTI             PE  Reason for admission: s/p fall at nursing facility with concern for SDH; AMS and R sided weakness    Surgery/Procedures:        3/2  LEFT CRANIOTOMY BURR HOLES FOR SUBDURAL  ??3/3  Cystopanendoscopy, stent insertion on the left   3/3   INFERIOR VENA CAVA FILTER PLACEMENT    Pertinent Medical History: Acute MI, CAD, HLD, HTN, Prostate Ca     *Precautions:  Fall Risk, bed alarm, B restraints, NGT, R UE weakness,   L subdural drain, HOH    Assessment of current deficits   [x]  Functional mobility  [x] ADLs  [x]  Strength               [x] Cognition   [x]  Functional transfers   [x]  IADLs         [x]  Safety Awareness   [x] Endurance   [x]  Fine Coordination        [x]  ROM     [x]  Vision/perception   [] Sensation    [x] Gross Motor Coordination [x]  Balance   []  Delirium                  [] Motor Control     [x]  Communication    OT PLAN OF CARE   OT POC based on physician orders, patient diagnosis and results of clinical assessment.       Frequency/Duration: 1-3 days/wk for 1-2 weeks PRN    Specific OT Treatment to include:   ADL retraining/adapted techniques and AE recommendations to increase functional independence within precautions                    Energy conservation techniques to improve tolerance for selfcare routine   Functional transfer/mobility training/DME recommendations for increased independence, safety and fall prevention         Patient/family education to increase safety and functional  independence within precautions              Environmental modifications for safe mobility and completion of ADLs                           Cognitive retraining ex's to improve problem solving skills & safe participation in ADLs/IADLs  Sensory re-education techniques to improve extremity awareness, maintain skin integrity and improve hand function  Visual/Perceptual retraining  to improve body awareness and safety during transfers and ADLs  Splinting/positioning needs to maintain joint/skin integrity and prevent contractures  Therapeutic activity to improve functional performance during ADLs/IADLs                                         Therapeutic exercise to improve tolerance and functional strength for ADLs   Balance retraining exercises/tasks for facilitation of postural control with dynamic challenges during ADLs .  Positioning to improve functional independence  Neuromuscular re-education: facilitation of righting/equilibrium reactions, normalization muscle tone/facilitation active functional movement                      Delirium prevention/treatment    Modified Rankin Scale   Score     Description  0             No symptoms  1             No significant disability despite symptoms  2             Slight disability; able to look after own affairs  3             Moderate disability; able to ambulate without assist/ requires assist with ADLs  4             Moderate/Severe disability;requires assist to ambulate/assist with ADLs  5             Severe disability;bedridden/incontinent   6             Deceased  Score:   5    Recommended Adaptive Equipment: TBA: AD, ADL AE pending progress     Home Living: Pt admitted from  [facility] nursing facility. Pt is a poor historian and not able to report PLOF due to cognitive deficits.   Prior Level of Function: Pt not able to report.   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   Memory: poor   Comprehension fair- simple instruction   Problem solving: fair-   Judgement/safety: poor               Communication skills: simple responses; pt not able to express basic needs           Vision: visual tracking grossly intact               Glasses:none present                                                   Hearing: HOH     RASS: 0  CAM-ICU: (NT) Delirium    UE Assessment:  Hand Dominance: Right [x]   Left []      ROM Strength STM goal: PRN   RUE  Limited shoulder flexion; tightness.   A/AROM: grossly 45; WFL distal with impaired grasp and FMC 2-/5 proximal  3-/5 distal WFL for ADLS; 3+/5     LUE A/AROM  grossly 90; WFL distal with impaired and FMC 3+/5 WFL for ADLS; 4-/5       Sensation: No c/o numbness/tingling in extremities. *contnue to assess  Tone: WNL   Edema: [facility]     Functional Assessment:  AM-PAC Daily Activity Raw Score: 6/24   Initial Eval Status  Date: 3/4 Treatment Status  Date: STGs = LTGs  Time frame: 7-14 days   Feeding Dep/NGT     Re-assess when indicated   Grooming Dep  (HOH assist to initiate tasks- hand washing and holding onto wash cloth to wash face)  Pt required Max A to bring B UE's to midline                        Mod A   while seated supported and using B UE's as fair functional assists during light tasks     UB dressing/bathing Dep                        Mod A       LB dressing/bathing Dep                        Max A   using AE as needed for safe reach/ energy conservation       Toileting Dep                        Max A     Bed Mobility  Supine to sit:   Dep+2    Sit to supine:   Dep+2                        Mod A  in prep of ADL tasks & transfers   Functional Transfers Sit to stand:   Max A  (2nd person to monitor medical lines and guide hand placement)  Stand to sit:   Max A                        Mod A  sit<>stand/functional  bathroom transfers using AD/DME as needed for balance and safety   Functional Mobility Mod A+2   side step to HOB                       Mod A   functional/bathroom mobility using AD as needed & demonstrating G safety     Balance Sitting:     Static:  Min A    Dynamic:Mod A  Standing: Max A+ 1-2  SBA dynamic sitting balance;   Mod A dynamic standing balance  during ADL tasks & transfers   Endurance/Activity Tolerance   F tolerance with light to moderate activity.   G   tolerance with moderate activity/self care routine   Visual/  Perceptual Impaired: visual awareness; body awareness                       Vitals:   HR at rest: 70 bpm HR at end of session: 71 bpm   Spo2 at rest:-% Spo2 at end of session -%   BP at rest:125/57 mmHg BP at end of session 131/91 mmHg       Treatment: OT treatment provided this date includes:  Bed mobility: Instruction on precautions prior to bed mobility to facilitate safe transfers and ADLS. Pt required  2 person assist for safe mobility due to complexity of medical condition, medical lines and deconditioning.    Balance retraining: Performed sitting/standing balance ex's with instruction to facilitate righting reactions with postural changes during ADLS.       ROM/exercise: B UE ROM ex's to increase body awareness and jt mobility for participation in ADLs    Delirium Prevention: Environmental and sensory modifications assessed and implemented to decrease ICU acquired delirium and to improve overall orientation, mentation and pt interaction with family/staff.     Line management and environmental modifications made prior to and end of session to ensure patient safety and to increase efficiency of session.  Skilled monitoring of HR, O2 saturation, blood pressure and patient's response to activity performed throughout session.    Comments: OK from RN to see patient. Upon arrival, patient supine in bed, responding to questions and agreeable to session.  Pt demo fair tolerance with fair-  understanding of education/techniques.  At end of session, patient returned to bed and bed placed in chair position to increase activity tolerance. Pillows placed under B UE for comfort & edema control.Restraints intact. Call light within reach, all lines and tubes intact. Pt instructed on use of call light for assistance and fall prevention. .    Patient presents with decreased ROM/strength,activity tolerance, dynamic balance, functional mobility and cognitive deficits limiting completion of ADLs and safety.  Pt can benefit from continued skilled OT to increase safety, functional independence and quality of life.     Rehab Potential: good for established goals    Patient / Family Goal: pt cannot report    Patient and/or family were instructed/educated on diagnosis, prognosis/goals and plan of care. Pt demonstrated poor understanding.      Evaluation Complexity: Moderate     ?? History: Expanded chart review of consults, imaging, and psychosocial history related to current functional performance.   ?? Exam: 5+ performance deficits identified limiting functional independence and safe return home   ?? Assistance/Modification: Min/mod assistance or modifications required to perform tasks. May have comorbidities that affect occupational performance.    []  Malnutrition indicators have been identified and nursing has been notified to ensure a dietitian consult is ordered.      Time In:1345             Time Out: 1323         Total Treatment time: 23   Min Units   OT Eval Low 97165     OT Eval Medium 97166 X    OT Eval High     OT Re-Eval 42595     Therapeutic Ex [id number]     Therapeutic Activities 97530 23    ADL/Self Care 97535     Orthotic Management 97760     Neuro Re-Ed 97112     Non-Billable Time        Evaluation time includes thorough review of current medical information, gathering information on past medical history/social history and prior level of function, completion of standardized testing/informal observation  of tasks, assessment of data and development of POC/Goals.     [id number], OTR/L [phone]

## 2020-11-12 NOTE — Progress Notes (Signed)
Vascular Surgery Progress Note    Pt is being seen in f/u today regarding IVC filter placement 3/3    Subjective  Pt s/e.  Groin looks good, no evidence of hematoma    Current Medications:   ??? sodium chloride     ??? lactated ringers 125 mL/hr at 11/12/20 0540   ??? sodium chloride     ??? sodium chloride 75 mL/hr at 11/10/20 2014      hydrALAZINE, sodium chloride flush, sodium chloride, fentanNYL, sodium chloride flush, sodium chloride, acetaminophen, fentanNYL, ondansetron **OR** ondansetron, bisacodyl, labetalol   ??? pantoprazole  40 mg IntraVENous Daily    And   ??? sodium chloride (PF)  10 mL IntraVENous Daily   ??? sodium chloride flush  5-40 mL IntraVENous 2 times per day   ??? cefTRIAXone (ROCEPHIN) IV  1,000 mg IntraVENous Q24H   ??? sodium chloride flush  5-40 mL IntraVENous 2 times per day   ??? [Held by provider] isosorbide mononitrate  120 mg Oral Daily   ??? atorvastatin  20 mg Oral Daily   ??? polyethylene glycol  17 g Oral Daily   ??? acetaminophen  650 mg Oral Q6H   ??? levetiracetam  500 mg IntraVENous Q12H        PHYSICAL EXAM:    BP 136/60    Pulse 67    Temp 98.7 ??F (37.1 ??C) (Oral)    Resp 16    Ht 6\' 2"  (1.88 m)    Wt 220 lb 3.2 oz (99.9 kg)    SpO2 99%    BMI 28.27 kg/m??     Intake/Output Summary (Last 24 hours) at 11/12/2020 0705  Last data filed at 11/12/2020 0600  Gross per 24 hour   Intake 2635 ml   Output 1186 ml   Net 1449 ml          Gen: awake, alert and oriented x3, no apparent distress  CVS: RRR  Resp: No increased work of breathing  Abd: Soft, non-tender, non-distended  R LE: No evidence of groin hematoma  L LE: Warm, good cap refill    LABS:    Lab Results   Component Value Date    WBC 22.0 (H) 11/12/2020    HGB 12.1 (L) 11/12/2020    HCT 38.4 11/12/2020    PLT 219 11/12/2020    PROTIME 13.3 (H) 11/09/2020    INR 1.2 11/09/2020    APTT 27.9 11/09/2020    K 3.8 11/12/2020    BUN 23 11/12/2020    CREATININE 1.1 11/12/2020       A/P  87 y.o. male polytrauma with head bleed.  Evidence of PE on CT chest.  Status  post IVC filter placement 3/3    Vascular surgery will sign off.  Please call with any questions or concerns      95, MD

## 2020-11-12 NOTE — Plan of Care (Signed)
Problem: Non-Violent Restraints  Goal: Removal from restraints as soon as assessed to be safe  11/12/2020 0028 by Virgia Land, RN  Outcome: Not Met This Shift     Problem: Non-Violent Restraints  Goal: No harm/injury to patient while restraints in use  11/12/2020 0028 by Virgia Land, RN  Outcome: Met This Shift     Problem: Non-Violent Restraints  Goal: Patient's dignity will be maintained  11/12/2020 0028 by Virgia Land, RN  Outcome: Met This Shift     Problem: Pain:  Goal: Pain level will decrease  Description: Pain level will decrease  11/12/2020 0028 by Virgia Land, RN  Outcome: Met This Shift     Problem: Pain:  Goal: Control of acute pain  Description: Control of acute pain  11/12/2020 0028 by Virgia Land, RN  Outcome: Met This Shift     Problem: Pain:  Goal: Control of chronic pain  Description: Control of chronic pain  11/12/2020 0028 by Virgia Land, RN  Outcome: Met This Shift     Problem: Skin Integrity:  Goal: Will show no infection signs and symptoms  Description: Will show no infection signs and symptoms  11/12/2020 0028 by Virgia Land, RN  Outcome: Met This Shift     Problem: Skin Integrity:  Goal: Absence of new skin breakdown  Description: Absence of new skin breakdown  11/12/2020 0028 by Virgia Land, RN  Outcome: Met This Shift

## 2020-11-12 NOTE — Other (Signed)
Report called to 4500.

## 2020-11-12 NOTE — Progress Notes (Signed)
SPEECH LANGUAGE PATHOLOGY  DAILY PROGRESS NOTE        PATIENT NAME:  Tyler Rhodes      DOB:  1928-11-22          TODAY'S DATE:  11/12/2020 ROOM:  4523/4523-A    Pt seen for ongoing speech/ language and dysphagia intervention. Upon entering room, Pt responded to SLP appropriately and timely with improved intelligibility. Response to [facility]- questions/ follow simple directions inconsistent with accuracy and attempts, but improved overall from eval yesterday.     Oral stimulation and oral care provided. Slight mprovement noted, as Pt not oral defensive/ guarding as previous day however oral awareness still deemed to be poor. Applesauce placed on labial and lingual surfaces with seemingly no awareness as bolus acceptance and manipulation absent. Bolus removed from oral cavity. Cont NPO with NGT.     Will cont.     CPT code(s) [id number]  speech/language tx  54270  dysphagia tx  Total minutes :  17 minutes

## 2020-11-12 NOTE — Telephone Encounter (Signed)
Scheduled patient CT scan and notified [facility] of all of his f/u apt's with [place], including CT apt

## 2020-11-12 NOTE — Other (Signed)
Patient attempting to pull on lines and tubes, unable to redirect at this time.  Soft bilateral wrist restraints remain in place for patients safety.

## 2020-11-13 LAB — COMPREHENSIVE METABOLIC PANEL W/ REFLEX TO MG FOR LOW K
ALT: 7 U/L (ref 0–40)
AST: 13 U/L (ref 0–39)
Albumin: 2.8 g/dL — ABNORMAL LOW (ref 3.5–5.2)
Alkaline Phosphatase: 86 U/L (ref 40–129)
Anion Gap: 12 mmol/L (ref 7–16)
BUN: 22 mg/dL (ref 6–23)
CO2: 24 mmol/L (ref 22–29)
Calcium: 8.1 mg/dL — ABNORMAL LOW (ref 8.6–10.2)
Chloride: 106 mmol/L (ref 98–107)
Creatinine: 0.8 mg/dL (ref 0.7–1.2)
GFR African American: >60
GFR Non-African American: >60 mL/min/{1.73_m2} (ref >=60)
Glucose: 121 mg/dL — ABNORMAL HIGH (ref 74–99)
Potassium reflex Magnesium: 3.8 mmol/L (ref 3.5–5.0)
Sodium: 142 mmol/L (ref 132–146)
Total Bilirubin: 0.2 mg/dL (ref 0.0–1.2)
Total Protein: 6.2 g/dL — ABNORMAL LOW (ref 6.4–8.3)

## 2020-11-13 LAB — CBC WITH AUTO DIFFERENTIAL
Basophils %: 0.3 % (ref 0.0–2.0)
Basophils Absolute: 0 E9/L (ref 0.00–0.20)
Eosinophils %: 1.7 % (ref 0.0–6.0)
Eosinophils Absolute: 0.26 E9/L (ref 0.05–0.50)
Hematocrit: 38.4 % (ref 37.0–54.0)
Hemoglobin: 12.1 g/dL — ABNORMAL LOW (ref 12.5–16.5)
Lymphocytes %: 20 % (ref 20.0–42.0)
Lymphocytes Absolute: 3.08 E9/L (ref 1.50–4.00)
MCH: 28.3 pg (ref 26.0–35.0)
MCHC: 31.5 % — ABNORMAL LOW (ref 32.0–34.5)
MCV: 89.9 fL (ref 80.0–99.9)
MPV: 11.8 fL (ref 7.0–12.0)
Monocytes %: 8.7 % (ref 2.0–12.0)
Monocytes Absolute: 1.39 E9/L — ABNORMAL HIGH (ref 0.10–0.95)
Neutrophils %: 69.6 % (ref 43.0–80.0)
Neutrophils Absolute: 10.78 E9/L — ABNORMAL HIGH (ref 1.80–7.30)
Platelets: 230 E9/L (ref 130–450)
RBC: 4.27 E12/L (ref 3.80–5.80)
RDW: 15.5 fL — ABNORMAL HIGH (ref 11.5–15.0)
WBC: 15.4 E9/L — ABNORMAL HIGH (ref 4.5–11.5)

## 2020-11-13 LAB — MAGNESIUM: Magnesium: 2.2 mg/dL (ref 1.6–2.6)

## 2020-11-13 LAB — CULTURE, URINE
Urine Culture, Routine: 50000
Urine Culture, Routine: <10000

## 2020-11-13 LAB — PHOSPHORUS: Phosphorus: 2.8 mg/dL (ref 2.5–4.5)

## 2020-11-13 LAB — CALCIUM, IONIZED: Calcium, Ionized: 1.23 mmol/L (ref 1.15–1.33)

## 2020-11-13 MED ORDER — HEPARIN SODIUM (PORCINE) 10000 UNIT/ML IJ SOLN
10000 UNIT/ML | Freq: Three times a day (TID) | INTRAMUSCULAR | Status: DC
Start: 2020-11-13 — End: 2020-11-18
  Administered 2020-11-13 – 2020-11-17 (×12): via SUBCUTANEOUS

## 2020-11-13 MED ORDER — HYDRALAZINE HCL 20 MG/ML IJ SOLN
20 MG/ML | INTRAMUSCULAR | Status: DC | PRN
Start: 2020-11-13 — End: 2020-11-18
  Administered 2020-11-13 – 2020-11-18 (×7): via INTRAVENOUS

## 2020-11-13 MED ORDER — ACETAMINOPHEN 160 MG/5ML PO SOLN
160 MG/5ML | Freq: Four times a day (QID) | ORAL | Status: DC
Start: 2020-11-13 — End: 2020-11-15
  Administered 2020-11-13 – 2020-11-15 (×7): via ORAL

## 2020-11-13 MED ORDER — LABETALOL HCL 5 MG/ML IV SOLN
5 MG/ML | INTRAVENOUS | Status: DC | PRN
Start: 2020-11-13 — End: 2020-11-18
  Administered 2020-11-14 (×2): via INTRAVENOUS

## 2020-11-13 MED FILL — BD POSIFLUSH 0.9 % IV SOLN: 0.9 % | INTRAVENOUS | Qty: 10

## 2020-11-13 MED FILL — ACETAMINOPHEN 650 MG/20.3ML PO SOLN: 650 MG/20.3ML | ORAL | Qty: 20.3

## 2020-11-13 MED FILL — LANSOPRAZOLE 3 MG/ML PO SUSP: 3 mg/mL | ORAL | Qty: 90

## 2020-11-13 MED FILL — ATORVASTATIN CALCIUM 20 MG PO TABS: 20 mg | ORAL | Qty: 1

## 2020-11-13 MED FILL — LEVETIRACETAM 100 MG/ML PO SOLN: 100 mg/mL | ORAL | Qty: 5

## 2020-11-13 MED FILL — HEPARIN SODIUM (PORCINE) 10000 UNIT/ML IJ SOLN: 10000 [IU]/mL | INTRAMUSCULAR | Qty: 1

## 2020-11-13 MED FILL — CEFTRIAXONE SODIUM 1 G IJ SOLR: 1 g | INTRAMUSCULAR | Qty: 1000

## 2020-11-13 MED FILL — HYDRALAZINE HCL 20 MG/ML IJ SOLN: 20 mg/mL | INTRAMUSCULAR | Qty: 1

## 2020-11-13 NOTE — Progress Notes (Signed)
Department of Neurosurgery  Progress Note    CHIEF COMPLAINT: s/p left burr hole 3/2    SUBJECTIVE:  Awake and alert. No headache    REVIEW OF SYSTEMS :  Constitutional: Negative for chills and fever.    Neurological: Negative for dizziness, tremors and speech change.     OBJECTIVE:   VITALS:  BP (!) 147/68    Pulse 71    Temp 97.6 ??F (36.4 ??C) (Axillary)    Resp 18    Ht 6\' 2"  (1.88 m)    Wt 220 lb 3.2 oz (99.9 kg)    SpO2 99%    BMI 28.27 kg/m??     PHYSICAL:  Neurologic: Alert and oriented to self, year, and month;   PERRL  Motor Exam:  Following commands, Motor exam 4/5 on right, 5/5 on left  Sensory:  Sensory intact  Incision c/d/i      DATA:  CBC:   Lab Results   Component Value Date    WBC 15.4 11/13/2020    RBC 4.27 11/13/2020    HGB 12.1 11/13/2020    HCT 38.4 11/13/2020    MCV 89.9 11/13/2020    MCH 28.3 11/13/2020    MCHC 31.5 11/13/2020    RDW 15.5 11/13/2020    PLT 230 11/13/2020    MPV 11.8 11/13/2020     BMP:    Lab Results   Component Value Date    NA 142 11/13/2020    K 3.8 11/13/2020    CL 106 11/13/2020    CO2 24 11/13/2020    BUN 22 11/13/2020    LABALBU 2.8 11/13/2020    LABALBU 4.5 01/06/2011    CREATININE 0.8 11/13/2020    CALCIUM 8.1 11/13/2020    GFRAA >60 11/13/2020    LABGLOM >60 11/13/2020    GLUCOSE 121 11/13/2020    GLUCOSE 107 01/08/2011     PT/INR:    Lab Results   Component Value Date    PROTIME 13.3 11/09/2020    PROTIME 13.0 01/06/2011    INR 1.2 11/09/2020     PTT:    Lab Results   Component Value Date    APTT 27.9 11/09/2020   [APTT}    Current Inpatient Medications  Current Facility-Administered Medications: atorvastatin (LIPITOR) tablet 20 mg, 20 mg, Per NG tube, Daily  levETIRAcetam (KEPPRA) 100 MG/ML solution 500 mg, 500 mg, Oral, BID  lansoprazole suspension SUSP 15 mg, 15 mg, Per NG tube, QAM AC  polyethylene glycol (GLYCOLAX) packet 17 g, 17 g, Per NG tube, Daily  bisacodyl (DULCOLAX) suppository 10 mg, 10 mg, Rectal, Daily PRN  hydrALAZINE (APRESOLINE) injection 10 mg, 10  mg, IntraVENous, Q4H PRN  labetalol (NORMODYNE;TRANDATE) injection 10 mg, 10 mg, IntraVENous, Q4H PRN  acetaminophen (TYLENOL) 160 MG/5ML solution 650 mg, 650 mg, Oral, Q6H  sodium chloride flush 0.9 % injection 5-40 mL, 5-40 mL, IntraVENous, 2 times per day  cefTRIAXone (ROCEPHIN) 1,000 mg in sterile water 10 mL IV syringe, 1,000 mg, IntraVENous, Q24H  sodium chloride flush 0.9 % injection 5-40 mL, 5-40 mL, IntraVENous, PRN  acetaminophen (TYLENOL) suppository 650 mg, 650 mg, Rectal, Q4H PRN  fentaNYL (SUBLIMAZE) injection 25 mcg, 25 mcg, IntraVENous, Q1H PRN  [Held by provider] isosorbide mononitrate (IMDUR) extended release tablet 120 mg, 120 mg, Oral, Daily  ondansetron (ZOFRAN-ODT) disintegrating tablet 4 mg, 4 mg, Oral, Q8H PRN **OR** ondansetron (ZOFRAN) injection 4 mg, 4 mg, IntraVENous, Q6H PRN    ASSESSMENT:   s/p left burr hole 3/2,  UTI    PLAN:  -Continue ICU care  -HOB to comfort  -Serial neurological exams  -No AC or AP  - ok to begin proph lovenox       Electronically signed by Duffy Bruce, PA on 11/13/2020 at 7:33 AM

## 2020-11-13 NOTE — Other (Signed)
Pt unable to follow directions. Attempts to pull at corpak and catheter

## 2020-11-13 NOTE — Progress Notes (Signed)
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 coli, sensitive) -status post cystoscopy with left ureteral stent insertion 11/11/2020  Pulmonary embolus-inability anticoagulate.  Status post IVC filter insertion 11/11/2020      Plan:  Neuro checks  Keppra  Tube feeds  Rocephin  PT OT AM PAC 7/24  Start subcu heparin today  DNI    Subjective:  Transferred from ICU yesterday  Went for uncomplicated IVC filter insertion and cystoscopy stent insertion    Objective:  General -elderly white man  Neuro -opens eyes to voice follows commands disoriented  HEENT -CorPak noted.  Left bur hole Cr[place] site is dry without drainage  Lungs -3 L nasal cannula nonlabored  Abdomen -nondistended nontender  Ext -no gross deformities  Skin -no rash

## 2020-11-13 NOTE — Plan of Care (Signed)
Restraints maintained for safety

## 2020-11-13 NOTE — Plan of Care (Signed)
Problem: Non-Violent Restraints  Goal: Removal from restraints as soon as assessed to be safe  11/13/2020 1123 by Ave Filter, RN  Outcome: Met This Shift  Goal: No harm/injury to patient while restraints in use  11/13/2020 1[address] by [address] Filter, RN  Outcome: Met This Shift  Goal: Patient's dignity will be maintained  11/13/2020 1123 by [address] Filter, RN  Outcome: Met This Shift

## 2020-11-13 NOTE — Progress Notes (Signed)
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 coli, sensitive) -status post cystoscopy with left ureteral stent insertion 11/11/2020  Pulmonary embolus-inability anticoagulate.  Status post IVC filter insertion 11/11/2020      Plan:  Neuro checks  Keppra  Tube feeds  Rocephin  PT OT AM PAC 7/24  Start subcu heparin today    Subjective:  Transferred from ICU yesterday  Went for uncomplicated IVC filter insertion and cystoscopy stent insertion    Objective:  General -elderly white man  Neuro -opens eyes to voice follows commands disoriented  HEENT -CorPak noted.  Left bur hole Craney site is dry without drainage  Lungs -3 L nasal cannula nonlabored  Abdomen -nondistended nontender  Ext -no gross deformities  Skin -no rash

## 2020-11-13 NOTE — Progress Notes (Signed)
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  He denies any pain or catheter discomfort    Vitals:    11/13/20 1400   BP: (!) 156/68   Pulse: 82   Resp: 16   Temp: 98 ??F (36.7 ??C)   SpO2: 97%       Allergies: Patient has no known allergies.    PAST MEDICAL HISTORY:   Past Medical History:   Diagnosis Date   ??? Acute MI (HCC)     pt had mi 1997   ??? CAD (coronary artery disease)    ??? Cholesterol serum increased    ??? Hyperlipidemia    ??? Prostate cancer (HCC)    ??? Prostate cancer (HCC)        PAST SURGICAL HISTORY:   Past Surgical History:   Procedure Laterality Date   ??? BLADDER SURGERY N/A 11/11/2020    CYSTOSCOPY RETROGRADE PYELOGRAM LEFT STENT INSERTION performed by Jacki Cones, MD at [facility]   ??? CARDIAC SURGERY     ??? CORONARY ANGIOPLASTY      pt had a triple by pass 1997   ??? CORONARY ANGIOPLASTY      pt had cat 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   ??? CRANIOTOMY Left 11/10/2020    LEFT CRANIOTOMY BURR HOLESFOR SUBDURAL performed by Rupert Stacks, MD at [facility] OR   ??? VENA CAVA FILTER PLACEMENT N/A 11/11/2020    INFERIOR VENA CAVA FILTER PLACEMENT performed by Louie Boston, MD at [facility] OR        PAST FAMILY HISTORY:    Family History   Problem Relation Age of Onset   ??? Stroke Mother    ??? Heart Disease Father        PAST SOCIAL HISTORY:    Social History     Socioeconomic History   ??? Marital status: Married     Spouse name: Not on file   ??? Number of children: Not on file   ??? Years of education: Not on file   ??? Highest education level: Not on file   Occupational History   ??? Not on file   Tobacco Use   ??? Smoking status: Former Smoker     Packs/day: 1.00     Years: 10.00     Pack years: 10.00   ??? Smokeless tobacco: Former Neurosurgeon     Quit date: 05/30/1996   Substance and Sexual Activity   ??? Alcohol use: No   ??? Drug use: No   ??? Sexual activity: Not on file   Other  Topics Concern   ??? Not on file   Social History Narrative   ??? Not on file     Social Determinants of Health     Financial Resource Strain:    ??? Difficulty of Paying Living Expenses: Not on file   Food Insecurity:    ??? Worried About Running Out of Food in the Last Year: Not on file   ??? Ran Out of Food in the Last Year: Not on file   Transportation Needs:    ??? Lack of Transportation (Medical): Not on file   ??? Lack of Transportation (Non-Medical): Not on file   Physical Activity:    ??? Days of Exercise per Week: Not on file   ??? Minutes of Exercise per Session: Not on  file   Stress:    ??? Feeling of Stress : Not on file   Social Connections:    ??? Frequency of Communication with Friends and Family: Not on file   ??? Frequency of Social Gatherings with Friends and Family: Not on file   ??? Attends Religious Services: Not on file   ??? Active Member of Clubs or Organizations: Not on file   ??? Attends Banker Meetings: Not on file   ??? Marital Status: Not on file   Intimate Partner Violence:    ??? Fear of Current or Ex-Partner: Not on file   ??? Emotionally Abused: Not on file   ??? Physically Abused: Not on file   ??? Sexually Abused: Not on file   Housing Stability:    ??? Unable to Pay for Housing in the Last Year: Not on file   ??? Number of Places Lived in the Last Year: Not on file   ??? Unstable Housing in the Last Year: Not on file       IV:     PRN: bisacodyl, hydrALAZINE, labetalol, sodium chloride flush, ondansetron **OR** ondansetron    Scheduled:   ??? heparin (porcine)  5,000 Units SubCUTAneous 3 times per day   ??? atorvastatin  20 mg Per NG tube Daily   ??? levETIRAcetam  500 mg Oral BID   ??? lansoprazole  15 mg Per NG tube QAM AC   ??? polyethylene glycol  17 g Per NG tube Daily   ??? acetaminophen  650 mg Oral Q6H   ??? sodium chloride flush  5-40 mL IntraVENous 2 times per day   ??? cefTRIAXone (ROCEPHIN) IV  1,000 mg IntraVENous Q24H   ??? [Held by provider] isosorbide mononitrate  120 mg Oral Daily       Lab Results   Component  Value Date    NA 142 11/13/2020    K 3.8 11/13/2020    BUN 22 11/13/2020    CREATININE 0.8 11/13/2020        Lab Results   Component Value Date    HGB 12.1 11/13/2020    HCT 38.4 11/13/2020       Lab Results   Component Value Date    PSA 5.73 (H) 12/01/2014       Review Of Systems:  Constitutional: Tired  Eyes: negative  Ears, nose, mouth, throat, and face: negative  Respiratory: negative  Cardiovascular: CAD  Gastrointestinal: negative  Genitourinary: Prostate cancer  Musculoskeletal: negative  Neurological: negative  Behavioral/Psych: negative  Endocrine: negative    Physical Exam:  Skin is dry, and without rashes  Respirations are non-labored, intact  Abdomen is soft, non-tender, non-distended.  Active bowel sounds are present.  No rebound or guarding  Alert and oriented x 3.  No focal motor/sensory deficits  Foley catheter is draining clear, yellow urine    Assessment and Plan:  POD#2--S/P Cystoscopy, left stent insertion   -PSA is stable and will continue to be monitored.  I believe he has had radiation therapy for prostate cancer in the past  -Continue the Foley for now  -Urine culture on 11/10/2020 is growing E. coli.  Continue antibiotics  -He will require left ureteroscopy and stone and stent removal at a later date in the near future  -We will continue to follow him during his hospital stay      Sandi Carne, MD  11/13/2020  3:34 PM

## 2020-11-14 LAB — CBC WITH AUTO DIFFERENTIAL
Basophils %: 0.5 % (ref 0.0–2.0)
Basophils Absolute: 0.06 E9/L (ref 0.00–0.20)
Eosinophils %: 1.2 % (ref 0.0–6.0)
Eosinophils Absolute: 0.16 E9/L (ref 0.05–0.50)
Hematocrit: 37.9 % (ref 37.0–54.0)
Hemoglobin: 12 g/dL — ABNORMAL LOW (ref 12.5–16.5)
Immature Granulocytes #: 0.1 E9/L
Immature Granulocytes %: 0.8 % (ref 0.0–5.0)
Lymphocytes %: 31.9 % (ref 20.0–42.0)
Lymphocytes Absolute: 4.17 E9/L — ABNORMAL HIGH (ref 1.50–4.00)
MCH: 28.6 pg (ref 26.0–35.0)
MCHC: 31.7 % — ABNORMAL LOW (ref 32.0–34.5)
MCV: 90.2 fL (ref 80.0–99.9)
MPV: 11.7 fL (ref 7.0–12.0)
Monocytes %: 10.6 % (ref 2.0–12.0)
Monocytes Absolute: 1.38 E9/L — ABNORMAL HIGH (ref 0.10–0.95)
Neutrophils %: 55 % (ref 43.0–80.0)
Neutrophils Absolute: 7.19 E9/L (ref 1.80–7.30)
Platelets: 232 E9/L (ref 130–450)
RBC: 4.2 E12/L (ref 3.80–5.80)
RDW: 15.3 fL — ABNORMAL HIGH (ref 11.5–15.0)
WBC: 13.1 E9/L — ABNORMAL HIGH (ref 4.5–11.5)

## 2020-11-14 LAB — MAGNESIUM: Magnesium: 2 mg/dL (ref 1.6–2.6)

## 2020-11-14 LAB — COMPREHENSIVE METABOLIC PANEL W/ REFLEX TO MG FOR LOW K
ALT: 7 U/L (ref 0–40)
AST: 13 U/L (ref 0–39)
Albumin: 3 g/dL — ABNORMAL LOW (ref 3.5–5.2)
Alkaline Phosphatase: 83 U/L (ref 40–129)
Anion Gap: 13 mmol/L (ref 7–16)
BUN: 17 mg/dL (ref 6–23)
CO2: 24 mmol/L (ref 22–29)
Calcium: 8.5 mg/dL — ABNORMAL LOW (ref 8.6–10.2)
Chloride: 107 mmol/L (ref 98–107)
Creatinine: 0.8 mg/dL (ref 0.7–1.2)
GFR African American: >60
GFR Non-African American: >60 mL/min/{1.73_m2} (ref >=60)
Glucose: 87 mg/dL (ref 74–99)
Potassium reflex Magnesium: 4 mmol/L (ref 3.5–5.0)
Sodium: 144 mmol/L (ref 132–146)
Total Bilirubin: 0.3 mg/dL (ref 0.0–1.2)
Total Protein: 6.3 g/dL — ABNORMAL LOW (ref 6.4–8.3)

## 2020-11-14 LAB — PHOSPHORUS: Phosphorus: 3 mg/dL (ref 2.5–4.5)

## 2020-11-14 MED ORDER — LACTATED RINGERS IV SOLN
INTRAVENOUS | Status: DC
Start: 2020-11-14 — End: 2020-11-16
  Administered 2020-11-14 – 2020-11-15 (×2): via INTRAVENOUS

## 2020-11-14 MED FILL — ISOSORBIDE MONONITRATE ER 60 MG PO TB24: 60 MG | ORAL | Qty: 2

## 2020-11-14 MED FILL — LABETALOL HCL 5 MG/ML IV SOLN: 5 mg/mL | INTRAVENOUS | Qty: 20

## 2020-11-14 MED FILL — ACETAMINOPHEN 650 MG/20.3ML PO SOLN: 650 MG/20.3ML | ORAL | Qty: 20.3

## 2020-11-14 MED FILL — LEVETIRACETAM 100 MG/ML PO SOLN: 100 mg/mL | ORAL | Qty: 5

## 2020-11-14 MED FILL — BD POSIFLUSH 0.9 % IV SOLN: 0.9 % | INTRAVENOUS | Qty: 10

## 2020-11-14 MED FILL — CEFTRIAXONE SODIUM 1 G IJ SOLR: 1 g | INTRAMUSCULAR | Qty: 1000

## 2020-11-14 MED FILL — ATORVASTATIN CALCIUM 20 MG PO TABS: 20 mg | ORAL | Qty: 1

## 2020-11-14 MED FILL — HEPARIN SODIUM (PORCINE) 10000 UNIT/ML IJ SOLN: 10000 [IU]/mL | INTRAMUSCULAR | Qty: 1

## 2020-11-14 MED FILL — HYDRALAZINE HCL 20 MG/ML IJ SOLN: 20 mg/mL | INTRAMUSCULAR | Qty: 1

## 2020-11-14 MED FILL — BD POSIFLUSH 0.9 % IV SOLN: 0.9 % | INTRAVENOUS | Qty: 20

## 2020-11-14 NOTE — Progress Notes (Signed)
IV team consult on hold due to pt needing re-evaluated by speech therapy. Patient passed bedside nursing swallow. Will continue to monitor.

## 2020-11-14 NOTE — Progress Notes (Signed)
11/14/2020 1:15 PM  Service: Urology  Group: [facility] ([facility])    Tyler Rhodes  [id number]    Subjective:    He is sleeping  He arouses to verbal stimuli  He does answer questions but is lethargic  His Foley catheter is draining yellow urine  No family present  No fevers    Review of Systems  Unable to obtain due to confusion      Scheduled Meds:  ??? heparin (porcine)  5,000 Units SubCUTAneous 3 times per day   ??? atorvastatin  20 mg Per NG tube Daily   ??? levETIRAcetam  500 mg Oral BID   ??? lansoprazole  15 mg Per NG tube QAM AC   ??? polyethylene glycol  17 g Per NG tube Daily   ??? acetaminophen  650 mg Oral Q6H   ??? sodium chloride flush  5-40 mL IntraVENous 2 times per day   ??? cefTRIAXone (ROCEPHIN) IV  1,000 mg IntraVENous Q24H   ??? [Held by provider] isosorbide mononitrate  120 mg Oral Daily       Objective:  Vitals:    11/14/20 0715   BP: 117/69   Pulse: 78   Resp: 18   Temp: 97.9 ??F (36.6 ??C)   SpO2: 97%         Allergies: Patient has no known allergies.    General Appearance: Sleeping, arouses to verbal stimuli, confused, no acute distress  Skin: no rash or erythema  Head: normocephalic, sutures intact  Pulmonary/Chest: normal air movement, no respiratory distress  Abdomen: soft, non-tender, non-distended  Genitourinary: Foley catheter draining yellow urine  Extremities: no cyanosis, clubbing or edema         Labs:     Recent Labs     11/14/20  0448   NA 144   K 4.0   CL 107   CO2 24   BUN 17   CREATININE 0.8   GLUCOSE 87   CALCIUM 8.5*       Lab Results   Component Value Date    HGB 12.0 11/14/2020    HCT 37.9 11/14/2020       Lab Results   Component Value Date    PSA 5.73 (H) 12/01/2014         Assessment/Plan:  POD#3--S/P Cystoscopy, left stent insertion       Creatinine stable  Continue antibiotics per primary  Leukocytosis resolving  Continue the Foley catheter  He will need PSA monitoring as an outpatient  We will also require definitive stone management as an outpatient via left ureteroscopy was  laser lithotripsy in the future  Please call with any additional questions or concerns    Vivia Birmingham, APRN - CNP   [facility]

## 2020-11-14 NOTE — Progress Notes (Signed)
Comprehensive Nutrition Assessment    Type and Reason for Visit:  Initial (New TF)    Nutrition Recommendations/Plan: Continue NPO, Modify Tube Feeding to better meet est needs    Standard with fiber (Jevity 1.5) @ 55 ml/hr + 1 protein modular daily  Will provide: 1320 ml tv, 1980 kcals, 84 gm pro (2080 kcals & 110 gm pro w/ mod), 1003 ml free water  Regimen meets 100% est calorie & protein needs    Nutrition Assessment:  Pt admit w/ AMS s/p fall found to have SDH now s/p burr hole crani. Noted pleural effusion & subsegmental pulmonary embolism s/p IVCF. Noted UTI w/ Hydronephrosis s/p cysto stent placement. Corpak placed for EN support 2/2 failed swallow eval. Will provide updated TF rec & monitor.    Malnutrition Assessment:  Malnutrition Status:  At risk for malnutrition   Context:  Acute Illness     Findings of the 6 clinical characteristics of malnutrition:  Energy Intake:   50% or less of estimated energy requirements for 5 or more days  Weight Loss:  Unable to assess (no hx on file)     Body Fat Loss:  No significant body fat loss     Muscle Mass Loss:  No significant muscle mass loss    Fluid Accumulation:  No significant fluid accumulation    Grip Strength:  Not Performed    Estimated Daily Nutrient Needs:  Energy (kcal):  MSJ 1691 x 1.2 SF= 2000-2100; Weight Used for Energy Requirements:  Admission     Protein (g):  100-120; Weight Used for Protein Requirements:  Ideal (1.2-1.4 g/kg)        Fluid (ml/day):  2000-2100; Method Used for Fluid Requirements:  1 ml/kcal      Nutrition Related Findings:  Pt A&O x2, aphasia/ dysphagia, +I/O's, +1 gen edema, weakness, active BS, Corpak w/ TF      Wounds:  Surgical Incision       Current Nutrition Therapies:    Current Tube Feeding (TF) Orders:  ?? Feeding Route: Nasoenteric  ?? Formula: Standard with Fiber  ?? Schedule: Continuous (40 ml/hr, running at goal)  ?? Water Flushes: 30 ml q 3 hr  ?? Current TF & Flush Orders Provides: 960 ml tv, 1440 kcals, 61 gm pro, 730  ml free water    Anthropometric Measures:  ?? Height: 6\' 2"  (188 cm)  ?? Current Body Weight: 212 lb (96.2 kg) (3/1 admit wt as CBW elevated)   ?? Admission Body Weight: 212 lb (96.2 kg) (3/1 first measured)    ?? Usual Body Weight:  (UTO no EMR hx on file)     ?? Ideal Body Weight: 190 lbs; % Ideal Body Weight 111.6 %   ?? BMI: 27.2 BMI Categories: Overweight (BMI 25.0-29.9)       Nutrition Diagnosis:   ?? Inadequate oral intake related to cognitive or neurological impairment as evidenced by NPO or clear liquid status due to medical condition,nutrition support - enteral nutrition    Nutrition Interventions:   Nutrition Education/Counseling:  Education not appropriate   Coordination of Nutrition Care:  Continue to monitor while inpatient    Goals:  Pt to tolerate TF at goal rate       Nutrition Monitoring and Evaluation:   Food/Nutrient Intake Outcomes:  Enteral Nutrition Intake/Tolerance  Physical Signs/Symptoms Outcomes:  Biochemical Data,Nutrition Focused Physical Findings,Skin,Weight,GI Status,Fluid Status or Edema     Discharge Planning:    Too soon to determine     Electronically signed  by Hillard Danker, RD, LD on 11/14/20 at 11:29 AM EST    Contact: Ext [phone]

## 2020-11-14 NOTE — Progress Notes (Signed)
Found corpak on the floor next to bed. Bridle remained intact. No obvious injury/trauma noted.

## 2020-11-14 NOTE — Progress Notes (Signed)
Department of Neurosurgery  Progress Note    CHIEF COMPLAINT: s/p left burr hole 3/2    SUBJECTIVE: easy to arouse. No headache    REVIEW OF SYSTEMS :  Constitutional: Negative for chills and fever.    Neurological: Negative for dizziness, tremors and speech change.     OBJECTIVE:   VITALS:  BP 117/69    Pulse 78    Temp 97.9 ??F (36.6 ??C) (Axillary)    Resp 18    Ht 6\' 2"  (1.88 m)    Wt 220 lb 3.2 oz (99.9 kg)    SpO2 97%    BMI 28.27 kg/m??     PHYSICAL:  Neurologic: Alert and oriented to self only  PERRL  Motor Exam:  Following commands, Motor exam 4/5 on right, 5/5 on left  Sensory:  Sensory intact  Incision c/d/i      DATA:  CBC:   Lab Results   Component Value Date    WBC 13.1 11/14/2020    RBC 4.20 11/14/2020    HGB 12.0 11/14/2020    HCT 37.9 11/14/2020    MCV 90.2 11/14/2020    MCH 28.6 11/14/2020    MCHC 31.7 11/14/2020    RDW 15.3 11/14/2020    PLT 232 11/14/2020    MPV 11.7 11/14/2020     BMP:    Lab Results   Component Value Date    NA 144 11/14/2020    K 4.0 11/14/2020    CL 107 11/14/2020    CO2 24 11/14/2020    BUN 17 11/14/2020    LABALBU 3.0 11/14/2020    LABALBU 4.5 01/06/2011    CREATININE 0.8 11/14/2020    CALCIUM 8.5 11/14/2020    GFRAA >60 11/14/2020    LABGLOM >60 11/14/2020    GLUCOSE 87 11/14/2020    GLUCOSE 107 01/08/2011     PT/INR:    Lab Results   Component Value Date    PROTIME 13.3 11/09/2020    PROTIME 13.0 01/06/2011    INR 1.2 11/09/2020     PTT:    Lab Results   Component Value Date    APTT 27.9 11/09/2020   [APTT}    Current Inpatient Medications  Current Facility-Administered Medications: heparin (porcine) injection 5,000 Units, 5,000 Units, SubCUTAneous, 3 times per day  atorvastatin (LIPITOR) tablet 20 mg, 20 mg, Per NG tube, Daily  levETIRAcetam (KEPPRA) 100 MG/ML solution 500 mg, 500 mg, Oral, BID  lansoprazole suspension SUSP 15 mg, 15 mg, Per NG tube, QAM AC  polyethylene glycol (GLYCOLAX) packet 17 g, 17 g, Per NG tube, Daily  bisacodyl (DULCOLAX) suppository 10 mg, 10 mg,  Rectal, Daily PRN  hydrALAZINE (APRESOLINE) injection 10 mg, 10 mg, IntraVENous, Q4H PRN  labetalol (NORMODYNE;TRANDATE) injection 10 mg, 10 mg, IntraVENous, Q4H PRN  acetaminophen (TYLENOL) 160 MG/5ML solution 650 mg, 650 mg, Oral, Q6H  sodium chloride flush 0.9 % injection 5-40 mL, 5-40 mL, IntraVENous, 2 times per day  cefTRIAXone (ROCEPHIN) 1,000 mg in sterile water 10 mL IV syringe, 1,000 mg, IntraVENous, Q24H  sodium chloride flush 0.9 % injection 5-40 mL, 5-40 mL, IntraVENous, PRN  [Held by provider] isosorbide mononitrate (IMDUR) extended release tablet 120 mg, 120 mg, Oral, Daily  ondansetron (ZOFRAN-ODT) disintegrating tablet 4 mg, 4 mg, Oral, Q8H PRN **OR** ondansetron (ZOFRAN) injection 4 mg, 4 mg, IntraVENous, Q6H PRN    ASSESSMENT:   s/p left burr hole 3/2,   UTI    PLAN:  -Continue ICU care  -HOB to  comfort  -Serial neurological exams  -No AC or AP  - ok to begin proph lovenox       Electronically signed by Duffy Bruce, PA on 11/14/2020 at 12:35 PM

## 2020-11-14 NOTE — Progress Notes (Signed)
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 coli, sensitive) -status post cystoscopy with left ureteral stent insertion 11/11/2020  Pulmonary embolus-inability anticoagulate.  Status post IVC filter insertion 11/11/2020      Plan:  Neuro checks  Keppra  Resume Imdur  Speech therapy for swallow eval  Rocephin  PT OT AM PAC 7/24  Subcutaneous heparin  DNI    Subjective:  Pulled out CorPak last night  No other acute changes    Objective:  Afebrile mildly hypertensive normal sinus rhythm   General -elderly white man  Neuro -opens eyes to voice follows commands disoriented  HEENT -  Left bur hole crani site is dry without drainage  Lungs -2 L nasal cannula nonlabored  Abdomen -nondistended nontender  Ext -no gross deformities  Skin -no rash

## 2020-11-14 NOTE — Plan of Care (Signed)
Problem: Injury - Risk of, Healthcare-Acquired Condition:  Goal: Will remain free from falls  Description: Will remain free from falls  Outcome: Met This Shift

## 2020-11-14 NOTE — Plan of Care (Signed)
Problem: Inadequate oral food/beverage intake (NI-2.1)  Goal: Food and/or Nutrient Delivery  Description: Individualized approach for food/nutrient provision.  Outcome: Met This Shift

## 2020-11-15 ENCOUNTER — Inpatient Hospital Stay: Admit: 2020-11-16 | Payer: MEDICARE | Primary: Internal Medicine

## 2020-11-15 LAB — COMPREHENSIVE METABOLIC PANEL W/ REFLEX TO MG FOR LOW K
ALT: 12 U/L (ref 0–40)
AST: 20 U/L (ref 0–39)
Albumin: 2.8 g/dL — ABNORMAL LOW (ref 3.5–5.2)
Alkaline Phosphatase: 81 U/L (ref 40–129)
Anion Gap: 12 mmol/L (ref 7–16)
BUN: 15 mg/dL (ref 6–23)
CO2: 23 mmol/L (ref 22–29)
Calcium: 8.3 mg/dL — ABNORMAL LOW (ref 8.6–10.2)
Chloride: 102 mmol/L (ref 98–107)
Creatinine: 0.8 mg/dL (ref 0.7–1.2)
GFR African American: >60
GFR Non-African American: >60 mL/min/{1.73_m2} (ref >=60)
Glucose: 88 mg/dL (ref 74–99)
Potassium reflex Magnesium: 4.1 mmol/L (ref 3.5–5.0)
Sodium: 137 mmol/L (ref 132–146)
Total Bilirubin: 0.5 mg/dL (ref 0.0–1.2)
Total Protein: 6.4 g/dL (ref 6.4–8.3)

## 2020-11-15 LAB — CBC WITH AUTO DIFFERENTIAL
Basophils %: 0.7 % (ref 0.0–2.0)
Basophils Absolute: 0 E9/L (ref 0.00–0.20)
Eosinophils %: 2.6 % (ref 0.0–6.0)
Eosinophils Absolute: 0.29 E9/L (ref 0.05–0.50)
Hematocrit: 39.3 % (ref 37.0–54.0)
Hemoglobin: 12.5 g/dL (ref 12.5–16.5)
Lymphocytes %: 42.6 % — ABNORMAL HIGH (ref 20.0–42.0)
Lymphocytes Absolute: 4.77 E9/L — ABNORMAL HIGH (ref 1.50–4.00)
MCH: 28 pg (ref 26.0–35.0)
MCHC: 31.8 % — ABNORMAL LOW (ref 32.0–34.5)
MCV: 87.9 fL (ref 80.0–99.9)
MPV: 11.6 fL (ref 7.0–12.0)
Monocytes %: 11.3 % (ref 2.0–12.0)
Monocytes Absolute: 1.22 E9/L — ABNORMAL HIGH (ref 0.10–0.95)
Neutrophils %: 43.5 % (ref 43.0–80.0)
Neutrophils Absolute: 4.88 E9/L (ref 1.80–7.30)
Platelets: 243 E9/L (ref 130–450)
RBC: 4.47 E12/L (ref 3.80–5.80)
RDW: 14.9 fL (ref 11.5–15.0)
WBC: 11.1 E9/L (ref 4.5–11.5)

## 2020-11-15 LAB — MAGNESIUM: Magnesium: 1.9 mg/dL (ref 1.6–2.6)

## 2020-11-15 LAB — PHOSPHORUS: Phosphorus: 3 mg/dL (ref 2.5–4.5)

## 2020-11-15 MED ORDER — PANTOPRAZOLE SODIUM 40 MG PO TBEC
40 MG | Freq: Every day | ORAL | Status: DC
Start: 2020-11-15 — End: 2020-11-21
  Administered 2020-11-16 – 2020-11-17 (×2): via ORAL

## 2020-11-15 MED ORDER — POLYETHYLENE GLYCOL 3350 17 G PO PACK
17 g | Freq: Every day | ORAL | Status: DC
Start: 2020-11-15 — End: 2020-11-23
  Administered 2020-11-16 – 2020-11-22 (×5): via ORAL

## 2020-11-15 MED ORDER — ATORVASTATIN CALCIUM 20 MG PO TABS
20 MG | Freq: Every day | ORAL | Status: DC
Start: 2020-11-15 — End: 2020-11-23
  Administered 2020-11-16 – 2020-11-22 (×5): via ORAL

## 2020-11-15 MED ORDER — LEVETIRACETAM 500 MG PO TABS
500 MG | Freq: Two times a day (BID) | ORAL | Status: DC
Start: 2020-11-15 — End: 2020-11-17
  Administered 2020-11-16 – 2020-11-17 (×3): via ORAL

## 2020-11-15 MED ORDER — ACETAMINOPHEN 325 MG PO TABS
325 MG | ORAL | Status: DC | PRN
Start: 2020-11-15 — End: 2020-11-23
  Administered 2020-11-20 – 2020-11-21 (×2): 650 mg via ORAL

## 2020-11-15 MED FILL — HEPARIN SODIUM (PORCINE) 10000 UNIT/ML IJ SOLN: 10000 [IU]/mL | INTRAMUSCULAR | Qty: 1

## 2020-11-15 MED FILL — BD POSIFLUSH 0.9 % IV SOLN: 0.9 % | INTRAVENOUS | Qty: 10

## 2020-11-15 MED FILL — ACETAMINOPHEN 650 MG/20.3ML PO SOLN: 650 MG/20.3ML | ORAL | Qty: 20.3

## 2020-11-15 MED FILL — LEVETIRACETAM 100 MG/ML PO SOLN: 100 mg/mL | ORAL | Qty: 5

## 2020-11-15 MED FILL — ATORVASTATIN CALCIUM 20 MG PO TABS: 20 mg | ORAL | Qty: 1

## 2020-11-15 MED FILL — ISOSORBIDE MONONITRATE ER 60 MG PO TB24: 60 MG | ORAL | Qty: 2

## 2020-11-15 MED FILL — CEFTRIAXONE SODIUM 1 G IJ SOLR: 1 g | INTRAMUSCULAR | Qty: 1000

## 2020-11-15 NOTE — Progress Notes (Signed)
SPEECH LANGUAGE PATHOLOGY  DAILY PROGRESS NOTE        PATIENT NAME:  Tyler Rhodes      DOB:  Aug 31, 1929          TODAY'S DATE:  11/15/2020 ROOM:  4514/4514-A    Patient seen for ongoing cognitive therapy. Pt was oriented to person and place. Could not state the date given semantic and phonemic cues from the SLP. Pt had difficulty with problem solving tasks. Pt was unable to identify how to appropriately gain the attention of a nurse- via the red call button. Could not determine is tasks were safe or unsafe with independence. Cues from SLP were unsuccessful. Will continue to follow.      CPT code(s) K4661473  therapeutic interventions that focus on cognitive function , initial  15 min  Total minutes :  15 minutes    Drinda Butts B.S.  Speech Language Pathology Graduate Student

## 2020-11-15 NOTE — Progress Notes (Signed)
[facility]  PROGRESS NOTE  ATTENDING NOTE        TRAUMA  MECHANISM:  fall    Chief Complaint   Patient presents with   ??? Head Injury     transfer from [place], large SDH  change in mental status       HPI  The patient is a 87 yo male who sustained a fall  at approximately earlier today at [facility].patient is confused and is a poor historian. History obtained from chart review. Per report, pt was at PT this AM when he had right sided weakness and confusion. He fell and hit his head.    Pain does not radiate. There are no alleviating or worsening factors regarding the pain.  Pt had a fall back in January and had multiple rib fx and has been declining sincce then  ??    Patient Active Problem List   Diagnosis   ??? Elevated serum cholesterol   ??? Hyperlipidemia   ??? Acute MI (HCC)   ??? Sepsis (HCC)   ??? Arthritis of knee, right   ??? Anemia   ??? Acute on chronic intracranial subdural hematoma (HCC)   ??? Single subsegmental pulmonary embolism without acute cor pulmonale (HCC)   ??? Delirium   ??? Slurred speech   ??? Hydronephrosis with urinary obstruction due to ureteral calculus   ??? Pleural effusion   ??? Subdural hemorrhage (HCC)   ??? Palliative care by specialist       SUBJECTIVE/OVERNIGHT EVENTS:  No issues    Review of Systems   Constitutional: Positive for activity change. Negative for appetite change and unexpected weight change.   HENT: Negative.    Eyes: Negative.    Respiratory: Negative.  Negative for cough and shortness of breath.    Cardiovascular: Negative.  Negative for chest pain and leg swelling.   Gastrointestinal: Negative.  Negative for abdominal distention, abdominal pain, anal bleeding, blood in stool, constipation, diarrhea, nausea and vomiting.   Endocrine: Negative.    Genitourinary: Negative.    Musculoskeletal: Negative.  Negative for arthralgias, back pain, gait problem, joint swelling and myalgias.   Skin: Negative.    Allergic/Immunologic: Negative.    Neurological: Negative.   Negative for dizziness, weakness and headaches.   Hematological: Negative.    Psychiatric/Behavioral: Positive for confusion. Negative for decreased concentration and sleep disturbance.       BP (!) 176/64    Pulse 77    Temp 98.8 ??F (37.1 ??C) (Oral)    Resp 20    Ht 6\' 2"  (1.88 m)    Wt 220 lb 3.2 oz (99.9 kg)    SpO2 97%    BMI 28.27 kg/m??   Physical Exam  Constitutional:       Appearance: Normal appearance.   HENT:      Head: Normocephalic and atraumatic.      Nose: Nose normal.      Mouth/Throat:      Mouth: Mucous membranes are moist.      Pharynx: Oropharynx is clear.   Eyes:      Extraocular Movements: Extraocular movements intact.      Pupils: Pupils are equal, round, and reactive to light.   Cardiovascular:      Rate and Rhythm: Normal rate and regular rhythm.      Pulses: Normal pulses.      Heart sounds: Normal heart sounds.   Pulmonary:      Effort: Pulmonary effort is normal.  Breath sounds: Normal breath sounds.   Abdominal:      General: There is no distension.      Palpations: Abdomen is soft.      Tenderness: There is no abdominal tenderness.   Musculoskeletal:         General: No tenderness or signs of injury.      Cervical back: Normal range of motion and neck supple.   Skin:     General: Skin is warm and dry.   Neurological:      General: No focal deficit present.      Mental Status: He is alert.      Comments: Generalize weakness         ASSESSMENT/PLAN:  1.  TBI--s/p burr holes, NSGY following, keppra, monitor Na, SBP  2.  PE--IVCF  3.  Hydronephrosis--s/p cysto and left ureteral stent    INCIDENTAL FINDINGS:  As above    AMPAC:  9/24    DVT/GI ppx:  IVCF/PPI    Redge Gainer, MD, MSc, FACS  11/15/2020  7:49 PM

## 2020-11-15 NOTE — Progress Notes (Addendum)
SPEECH/LANGUAGE PATHOLOGY  CLINICAL ASSESSMENT OF SWALLOWING FUNCTION   and PLAN OF CARE    PATIENT NAME:  Tyler Rhodes  (male)     MRN:  [id number]    DOB:  [birth year]  (87 y.o.)  STATUS:  Inpatient: Room 4514/4514-A    TODAY'S DATE:  11/15/2020  11/14/20 0730   SLP swallowing-dysphagia evaluation and treatment Start: 11/14/20 0730, End: 11/14/20 0730, ONE TIME, Standing Count: 1 Occurrences, S    Gianmarino C Gianfrate, DO      REASON FOR REFERRAL: Assessment of oropharyngeal swallow function   EVALUATING THERAPIST: Drinda Butts                 RESULTS:    DYSPHAGIA DIAGNOSIS:   Questionable oropharyngeal phase dysphagia - limited intake on exam, patient consumed several bites of applesauce but refused to trial thin liquid.       DIET RECOMMENDATIONS:  Pureed consistency solids (IDDSI level 4) with  pudding consistency (extremely thick - IDDSI level 4)  liquids   Will continue to follow for ability and tolerance of upgraded diet.     FEEDING RECOMMENDATIONS:     Assistance level:  Stand by assistance is needed during all oral intake, Encourage self-feeding      Compensatory strategies recommended: Small bites/sips, Alternate solids and liquids and No straw      Discussed recommendations with nursing and/or faxed report to referring provider: Yes    SPEECH THERAPY  PLAN OF CARE   The dysphagia POC is established based on physician order, dysphagia diagnosis and results of clinical assessment     Meal time assessment for 1-2 sessions to provide diet modification and compensatory strategy implementation due to need to ensure proper implementation of compensatory strategies during PO intake  and diet modifications    Conditions Requiring Skilled Therapeutic Intervention for dysphagia:    Oral motor strength/coordination impairment  Evaluate tolerance of recommended diet    Specific dysphagia interventions to include:     Compensatory strategy training   Meal time assessment for 1-2 sessions to provide diet modification  and compensatory strategy implementation due to need to ensure proper implementation of compensatory strategies during PO intake and diet modifications    Specific instructions for next treatment:  ongoing PO analysis to upgrade diet and evaluate tolerance of current PO recommendation and initiate instruction of compensatory strategies  Patient Treatment Goals:    Short Term Goals:  Pt will complete PO trials of upgraded diet textures with SLP only to determine the least restrictive PO diet to maintain adequate nutrition/hydration with no more than 1 overt s/s of pen/asp.  Pt will participate in meal time assessment for 1-2 sessions to provide diet modification and compensatory strategy implementation due to need to ensure proper implementation of compensatory strategies during PO intake and diet modifications    Long Term Goals:   Pt will maintain adequate nutrition/hydration via PO intake of the least restrictive oral diet with implementation of safe swallow/ compensatory strategies and decrease signs/symptoms of aspiration to less than 1 x/day.      Patient/family Goal:    To get stronger and To be able to eat/drink     Plan of care discussed with Patient   The Patient understand(s) the diagnosis, prognosis and plan of care     Rehabilitation Potential/Prognosis: fair                    ADMITTING DIAGNOSIS: Acute on chronic intracranial subdural hematoma (HCC) [I62.01,  I62.03]    VISIT DIAGNOSIS:       PATIENT REPORT/COMPLAINT: denies difficulty swallowing  patient currently NPO pending results of this evaluation  RN cleared patient for participation in assessment     yes     PRIOR LEVEL OF SWALLOW FUNCTION:    PAST HISTORY OF DYSPHAGIA?: none reported    Home diet: Regular consistency solids (IDDSI level 7) with  thin liquids (IDDSI level 0)  Current Diet Order:  Diet NPO    PROCEDURE:  Consistencies Administered During the Evaluation   Liquids: Denied trial of any liquids   Solids:  pureed foods      Method of  Intake:   spoon  Fed by clinician      Position:   Seated, upright    CLINICAL ASSESSMENT:  Oral Stage:       Decreased bolus formation due to:  poor/missing dentition   and cognitive function      Pharyngeal Stage:    One occurrence of multiple swallows with one bite of puree  No other signs of aspiration were noted during this evaluation however, silent aspiration cannot be ruled out at bedside.  If silent aspiration is suspected, a Videofluoroscopic Study of Swallowing (MBS) is recommended and requires a physician order.    Cognition:   Confusion noted    Oral Peripheral Examination   Adequate lingual/labial strength     Current Respiratory Status    room air     Parameters of Speech Production  Respiration:  Adequate for speech production  Quality:   Harsh  Intensity: Loud    Volitional Swallow: present     Volitional Cough:   present     Pain: No pain reported.    EDUCATION:   The Warehouse manager (SLP) completed education regarding results of evaluation and that intervention is warranted at this time.  Learner: Patient  Education: Reviewed results and recommendations of this evaluation and Reviewed diet and strategies  Evaluation of Education:  Verbalizes understanding    This plan may be re-evaluated and revised as warranted.      Evaluation Time includes thorough review of current medical information, gathering information on past medical history/social history and prior level of function, completion of standardized testing/informal observation of tasks, assessment of data and education on plan of care and goals.    [x] The admitting diagnosis and active problem list, have been reviewed prior to initiation of this evaluation.        ACTIVE PROBLEM LIST:   Patient Active Problem List   Diagnosis   ??? Elevated serum cholesterol   ??? Hyperlipidemia   ??? Acute MI (HCC)   ??? Sepsis (HCC)   ??? Arthritis of knee, right   ??? Anemia   ??? Acute on chronic intracranial subdural hematoma (HCC)   ??? Single subsegmental  pulmonary embolism without acute cor pulmonale (HCC)   ??? Delirium   ??? Slurred speech   ??? Hydronephrosis with urinary obstruction due to ureteral calculus   ??? Pleural effusion   ??? Subdural hemorrhage (HCC)   ??? Palliative care by specialist         CPT code:   bedside swallow eval    INTERVENTION  CPT Code: 62035  dysphagia tx    Pt provided education and feedback r/t importance of oral care. Pt was initially resistive to SLP compling oral care, however extensive edu to Pt was successful. Pt voiced an understanding.  Criss Alvine B.S.  Speech Language Pathology Graduate Student

## 2020-11-15 NOTE — Plan of Care (Signed)
Problem: Injury - Risk of, Healthcare-Acquired Condition:  Goal: Will remain free from falls  Description: Will remain free from falls  11/15/2020 0052 by Vernelle Emerald, RN  Outcome: Met This Shift  11/15/2020 0047 by Vernelle Emerald, RN  Outcome: Met This Shift

## 2020-11-15 NOTE — Plan of Care (Signed)
Problem: Injury - Risk of, Healthcare-Acquired Condition:  Goal: Will remain free from falls  Description: Will remain free from falls  Outcome: Met This Shift

## 2020-11-15 NOTE — Care Coordination-Inpatient (Signed)
11/15/2020 - admitted for acute on chronic subdural hematoma. S/P 3/2 Left fronto/temporal burr hole and subdural drain placement. S/P 3/3 vena cava filter placement. S/P cysto with left stent insertion. Neurosurgery, urology, general surgery following. Pt pulled envue feeding tube out - SLP eval to be done to see if pt can have diet or need PEG tube. IV LR @ 75 cc/hr, IV rocephin q24h. Sent message to [facility] - pt can return to facility - will need to be out of restraints for 24 hours, will need updated therapy notes for a precert. Will also need a covid test done within 24 hours of discharge. Ambulance form completed and placed in envelope on soft chart. SW/CM will follow.

## 2020-11-15 NOTE — Progress Notes (Signed)
During bedside handoff, patient was noted to have new altered mental status. Per day shift RN, patient was able to talk clearly , hold a conversatio, and follow some commands. Currently patient is experiencing expressive aphasia and not making sense when talking. Pupils are equal round and reactive to light; patient is not following commands at this time. Dr. Priscella Mann notified, STAT head CT ordered.

## 2020-11-15 NOTE — Progress Notes (Signed)
Department of Neurosurgery  Progress Note    CHIEF COMPLAINT: s/p left burr hole 3/2    SUBJECTIVE: awake and alert. Speech improving. Denies headache.     REVIEW OF SYSTEMS :  Constitutional: Negative for chills and fever.    Neurological: Negative for dizziness, tremors and speech change.     OBJECTIVE:   VITALS:  BP (!) 145/94    Pulse 78    Temp 98.1 ??F (36.7 ??C) (Axillary)    Resp 18    Ht 6\' 2"  (1.88 m)    Wt 220 lb 3.2 oz (99.9 kg)    SpO2 94%    BMI 28.27 kg/m??     PHYSICAL:  Neurologic: Alert and oriented to self and place  PERRL  Motor Exam:  Following commands, Motor exam 4/5 on right, 5/5 on left  Sensory:  Sensory intact  Incision c/d/i      DATA:  CBC:   Lab Results   Component Value Date    WBC 11.1 11/15/2020    RBC 4.47 11/15/2020    HGB 12.5 11/15/2020    HCT 39.3 11/15/2020    MCV 87.9 11/15/2020    MCH 28.0 11/15/2020    MCHC 31.8 11/15/2020    RDW 14.9 11/15/2020    PLT 243 11/15/2020    MPV 11.6 11/15/2020     BMP:    Lab Results   Component Value Date    NA 137 11/15/2020    K 4.1 11/15/2020    CL 102 11/15/2020    CO2 23 11/15/2020    BUN 15 11/15/2020    LABALBU 2.8 11/15/2020    LABALBU 4.5 01/06/2011    CREATININE 0.8 11/15/2020    CALCIUM 8.3 11/15/2020    GFRAA >60 11/15/2020    LABGLOM >60 11/15/2020    GLUCOSE 88 11/15/2020    GLUCOSE 107 01/08/2011     PT/INR:    Lab Results   Component Value Date    PROTIME 13.3 11/09/2020    PROTIME 13.0 01/06/2011    INR 1.2 11/09/2020     PTT:    Lab Results   Component Value Date    APTT 27.9 11/09/2020   [APTT}    Current Inpatient Medications  Current Facility-Administered Medications: [START ON 11/16/2020] atorvastatin (LIPITOR) tablet 20 mg, 20 mg, Oral, Daily  [START ON 11/16/2020] polyethylene glycol (GLYCOLAX) packet 17 g, 17 g, Oral, Daily  levETIRAcetam (KEPPRA) tablet 500 mg, 500 mg, Oral, BID  acetaminophen (TYLENOL) tablet 650 mg, 650 mg, Oral, Q4H PRN  [START ON 11/16/2020] pantoprazole (PROTONIX) tablet 40 mg, 40 mg, Oral, QAM  AC  lactated ringers infusion, , IntraVENous, Continuous  heparin (porcine) injection 5,000 Units, 5,000 Units, SubCUTAneous, 3 times per day  bisacodyl (DULCOLAX) suppository 10 mg, 10 mg, Rectal, Daily PRN  hydrALAZINE (APRESOLINE) injection 10 mg, 10 mg, IntraVENous, Q4H PRN  labetalol (NORMODYNE;TRANDATE) injection 10 mg, 10 mg, IntraVENous, Q4H PRN  sodium chloride flush 0.9 % injection 5-40 mL, 5-40 mL, IntraVENous, 2 times per day  cefTRIAXone (ROCEPHIN) 1,000 mg in sterile water 10 mL IV syringe, 1,000 mg, IntraVENous, Q24H  sodium chloride flush 0.9 % injection 5-40 mL, 5-40 mL, IntraVENous, PRN  isosorbide mononitrate (IMDUR) extended release tablet 120 mg, 120 mg, Oral, Daily  ondansetron (ZOFRAN-ODT) disintegrating tablet 4 mg, 4 mg, Oral, Q8H PRN **OR** ondansetron (ZOFRAN) injection 4 mg, 4 mg, IntraVENous, Q6H PRN    ASSESSMENT:   s/p left burr hole 3/2,   UTI  PLAN:  -Continue ICU care  -HOB to comfort  -Serial neurological exams  -No AC or AP  - ok to begin proph lovenox       Electronically signed by Wenda Low, PA-C on 11/15/2020 at 7:38 PM

## 2020-11-15 NOTE — Progress Notes (Signed)
Physical Therapy  Physical Therapy Treatment    Name: Tyler Rhodes  DOB: 28-Apr-1929  MRN: [id number]      Date of Service: 11/15/2020    Evaluating PT:  Levada Dy, PT, DPT [phone]    Room #:  4514/4514-A  Diagnosis:  Acute on chronic intracranial subdural hematoma (HCC) [I62.01, I62.03]  PMHx/PSHx:  CAD, HLD, Prostate cancer, MI  Procedure/Surgery:    (11/10/20) Left frontotemporal bur hole drainage of subdural hematoma, Placement of subdural drain  (11/11/20) Inferior vena cava filter placement   (11/11/20)  Cystopanendoscopy, stent insertion on the left  Precautions:  Fall risk, , , , cognition, alarm  Equipment Needs:  TBD     SUBJECTIVE:    Pt admitted from skilled nursing facility. Unable to obtain subjective history due to cognition.     OBJECTIVE:   Initial Evaluation  Date: 11/12/20 Treatment  11/15/2020 Short Term/ Long Term   Goals   AM-PAC 6 Clicks 7/24 9/24    Was pt agreeable to Eval/treatment? Yes Yes    Does pt have pain? No signs of pain  No    Bed Mobility  Rolling: NT  Supine to sit: Dep x 2 with HOB elevated   Sit to supine: Dep x 2  Scooting: Dep  Rolling: NT  Supine to sit: MaxA x2  Sit to supine: MaxA x2  Scooting: MaxA x2  ModA   Transfers Sit to stand: MaxA  Stand to sit: MaxA  Stand pivot: NT Sit to stand: ModA x2  Stand to sit: ModA x2  Stand pivot: NT ModA   Ambulation   A few sidesteps with ModA x 2 and HHA 2 feet (laterally) with no AD MaxA x2 >50 feet with ModA and AAD    Stair negotiation: ascended and descended NT NT >2 steps with 1 rail ModA   ROM BUE:  Defer to OT note  BLE:  WFL     Strength BUE:  Defer to OT note  B knee extension: 4-/5  B Plantarflexion: 4/5  Increase by 1/3 MMT grade   Balance Sitting EOB:  ModA dynamic, MinA static   Dynamic Standing:  ModA x 2 and HHA  Sitting EOB:  MaxA   Dynamic Standing:  MaxA x2 with no AD  Sitting EOB:  SBA  Dynamic Standing:  ModA with AAD     Pt is A & O x 2 (self, "hospital")  Sensation:  No reports of numbness/tingling to extremities  Edema:   Unremarkable    Vitals:   HR 71 following activity.  HR 76, SpO2 96% following activity.    Therapeutic Exercises:    - Supine BLE ankle plantarflexion/dorsiflexion AAROM (1 x 10)  - Supine BLE knee flexion/extension AAROM (1 x 10)  - Supine BLE quad sets (1 x 10)    Patient education  Pt educated on benefit of therapeutic exercise, safety during functional mobility.    Patient response to education:   Pt verbalized understanding Pt demonstrated skill Pt requires further education in this area   Yes Yes Yes     ASSESSMENT:    Comments:  Session cleared by nursing. Collaboration with OT required for safety. Patient in semi-Fowler's upon arrival; agreeable to PT session. Required increased time, assistance of trunk and BLE to perform supine <> sit. Exhibited left trunk lean in sitting, requiring frequent verbal cues and maximum assistance to correct. Patient able to intermittently maintain static sitting balance without physical assistance. Tolerated sitting EOB for extended period  of time while interdisciplinary care was provided. Performed sit <> stand transfer with B knee block. Exhibited flexed forward standing posture, requiring verbal cues to correct. Performed side steps with decreased speed, decreased step height/length, unsteadiness. Activity limited by fatigue. Upon returning to supine, performed therapeutic exercises to maintain/improve strength, mobility, endurance. Patient left in semi-Fowler's position with call light in reach.    Treatment:  Patient practiced and was instructed in the following treatment:    ??? Bed mobility - verbal cues to facilitate proper positioning and sequencing; physical assistance provided during activity  ??? Static sitting - performed to promote upright tolerance, postural awareness, and balance maintenance  ??? Functional transfers - verbal cues to facilitate proper positioning and sequencing; physical assistance provided during activity  ??? Ambulation - verbal cues to facilitate  proper positioning and sequencing, particularly related to posture, foot placement; physical assistance provided during activity  ??? Therapeutic exercises - performed to maintain/improve strength, mobility, endurance    PLAN:    Patient is making good progress towards established goals.  Will continue with current POC.      Time in  1510  Time out  1540    Total Treatment Time  30 minutes     CPT codes:  []  Gait training 0 minutes  []  Manual therapy 97140 0 minutes  [x]  Therapeutic activities 97530 30 minutes  []  Therapeutic exercises 97110 0 minutes  []  Neuromuscular reeducation 97112 0 minutes    [id number], PT, DPT  [phone]

## 2020-11-15 NOTE — Progress Notes (Signed)
Coordination of care discussion and chart review with PM team. No immediate PM psychosocial needs identified for Tyler Rhodes. Pt seen at bedside, he is alert. Call placed to his daughter Tyler Rhodes , continue current plan of care. She would like him to have the opportunity to eat, and to get physical therapy.Speech eval and dysphagia diet as well as PT eval noted.  Social worker will remain available for on-going psychosocial support, as needed.

## 2020-11-15 NOTE — Plan of Care (Signed)
Problem: Skin Integrity:  Goal: Will show no infection signs and symptoms  Description: Will show no infection signs and symptoms  Outcome: Ongoing  Goal: Absence of new skin breakdown  Description: Absence of new skin breakdown  Outcome: Ongoing     Problem: Neurological  Goal: Maximum potential motor/sensory/cognitive function  Outcome: Ongoing     Problem: Verbal Communication - Impaired:  Goal: Functional communication will improve  Description: Functional communication will improve  Outcome: Ongoing  Goal: Ability to interact with others will improve  Description: Ability to interact with others will improve  Outcome: Ongoing  Goal: Ability to establish a method of communication will improve  Description: Ability to establish a method of communication will improve  Outcome: Ongoing     Problem: Injury - Risk of, Healthcare-Acquired Condition:  Goal: Will remain free from falls  Description: Will remain free from falls  11/15/2020 1040 by Arnoldo Hooker, RN  Outcome: Ongoing  11/15/2020 0052 by Vernelle Emerald, RN  Outcome: Met This Shift  11/15/2020 0047 by Vernelle Emerald, RN  Outcome: Met This Shift     Problem: Nutrition Deficit - Risk of:  Goal: Ability to achieve adequate nutritional intake will improve  Description: Ability to achieve adequate nutritional intake will improve  Outcome: Ongoing     Problem: Swallowing - Impaired:  Goal: Able to swallow without choking  Description: Able to swallow without choking  Outcome: Ongoing  Goal: Absence of aspiration  Description: Absence of aspiration  Outcome: Ongoing     Problem: Psychosocial Distress:  Goal: Absence of psychosocial distress  Description: Absence of psychosocial distress  Outcome: Ongoing     Problem: Disruptive Behavior:  Goal: Knowledge of developmental care interventions  Description: Absence of violence  Outcome: Ongoing  Goal: Decrease in feelings of agitation  Description: Decrease in feelings of agitation  Outcome: Ongoing

## 2020-11-15 NOTE — Progress Notes (Signed)
Occupational Therapy  OT BEDSIDE TREATMENT NOTE   [facility]  [address], [place], [place]       Date:11/15/2020  Patient Name: Tyler Rhodes  MRN: [id number]  DOB: Apr 24, 1929  Room: 4514/4514-A     Per OT Eval:    Evaluating OT: Charlie Pitter, OTR/L [phone]  ??  Referring Provider: Renee Rival, MD   Specific Provider Orders/Date: OT eval and treat (11/09/20)     ??     Diagnosis: Left subdural hematoma  ??                  UTI                     PE  Reason for admission: s/p fall at nursing facility with concern for SDH; AMS and R sided weakness  ??  Surgery/Procedures:        3/2  LEFT CRANIOTOMY BURR HOLES FOR SUBDURAL  ??3/3  Cystopanendoscopy, stent insertion on the left   3/3   INFERIOR VENA CAVA FILTER PLACEMENT  ??  Pertinent Medical History: Acute MI, CAD, HLD, HTN, Prostate Ca   ??  *Precautions:  Fall Risk, bed alarm, B restraints, NGT, R UE weakness,   L subdural drain, HOH  ??  Assessment of current deficits   [x] ? Functional mobility          [x] ?ADLs           [x] ? Strength                  [x] ?Cognition   [x] ? Functional transfers        [x] ? IADLs         [x] ? Safety Awareness   [x] ?Endurance   [x] ? Fine Coordination           [x] ? ROM           [x] ? Vision/perception    [] ?Sensation     [x] ?Gross Motor Coordination [x] ? Balance    [] ? Delirium                  [] ?Motor Control     [x] ? Communication  ??  OT PLAN OF CARE   OT POC based on physician orders, patient diagnosis and results of clinical assessment.  ??     Frequency/Duration: 1-3 days/wk for 1-2 weeks PRN    Specific OT Treatment to include:??  ADL retraining/adapted techniques and AE recommendations to increase functional independence within precautions????????????????????????????????????  Energy conservation techniques to improve tolerance for selfcare routine   Functional transfer/mobility training/DME recommendations for increased independence, safety and fall prevention ????????????  Patient/family education to  increase safety and functional independence within precautions ??????????????????????  Environmental modifications for safe mobility and completion of ADLs??????????????????????????????????????????????????  Cognitive retraining ex's to improve problem solving skills &??safe participation in ADLs/IADLs  Sensory re-education techniques to improve extremity awareness, maintain skin integrity and improve hand function ????????????????????????????????????????????????????  Visual/Perceptual retraining  to improve body awareness and safety during transfers and ADLs  Splinting/positioning needs to maintain joint/skin integrity and prevent contractures  Therapeutic activity to improve functional performance during ADLs/IADLs ????????????????????????????????????????????????????????????????????????????  Therapeutic exercise to improve tolerance and functional strength for ADLs   Balance retraining exercises/tasks for facilitation of postural control with dynamic challenges during ADLs .  Positioning to improve functional independence  Neuromuscular re-education: facilitation of righting/equilibrium reactions, normalization muscle tone/facilitation active functional movement????????????????????????????????????????  Delirium prevention/treatment  ??  Modified Rankin Scale  Score     Description  0             No symptoms  1             No significant disability despite symptoms  2             Slight disability; able to look after own affairs  3             Moderate disability; able to ambulate without assist/ requires assist with ADLs  4             Moderate/Severe disability;requires assist to ambulate/assist with ADLs  5             Severe disability;bedridden/incontinent   6             Deceased  Score:   5  ??  Recommended Adaptive Equipment: TBA: AD, ADL AE pending progress   ??  Home Living: Pt admitted from  [facility] nursing facility. Pt is a poor historian and not able to report PLOF due to cognitive deficits.   Prior Level of Function: Pt not able to report.   Per old chart:    Occupation: retired, Marine scientist at [employer]??  ??Interest &??Hobby: traveling  and cruises??  ??  Pain Level: pt c/o 0/10  pain  this session; no c/o pain during session  ??  Cognition: A&O: 1/4  (self only)  Follows gross 1 step commands with increased time, pleasant & cooperative, repeated cues needed due to [facility], increased time for processing, mod delay              Memory: poor             Comprehension fair- simple instruction             Problem solving: fair-             Judgement/safety: poor  ??             Communication skills: simple responses, HOH             Vision: visual tracking grossly intact                    Glasses:none present                                                       Hearing: HOH               UE Assessment:  Hand Dominance: Right [x] ?  Left [] ?  ??  ?? ROM Strength STM goal: PRN   RUE  Limited shoulder flexion; tightness.   A/AROM: grossly 45; WFL distal with impaired grasp and FMC 2-/5 proximal  3-/5 distal WFL for ADLS; 3+/5  ??   LUE A/AROM grossly 90; WFL distal with impaired and FMC 3+/5 WFL for ADLS; 4-/5  ??   ??  Sensation: No c/o numbness/tingling in extremities. *contnue to assess  Tone: WNL   Edema: [facility]     Functional Assessment:  AM-PAC Daily Activity Raw Score: 8/24  ?? Initial Eval Status  Date: 3/4 Treatment Status  Date:  11/15/20 STGs = LTGs  Time frame: 7-14 days   Feeding Dep/NGT ??Pureed/Pudding thick    Re-assess when indicated   Grooming Dep  (HOH assist to initiate  tasks- hand washing and holding onto wash cloth to wash face)  Pt required Max A to bring B UE's to midline ??Max A  Simple tasks  Chronic limited ROM R shoulder                        Mod A   while seated supported and using B UE's as fair functional assists during light tasks  ??   UB dressing/bathing Dep Max A  ??with gown                        Mod A  ??  ??   LB dressing/bathing Dep Dep  Donn/doff socks                       Max A   using AE as needed for safe reach/ energy conservation    ??   Toileting Dep Dep  Foley present ??                       Max A  ??   Bed Mobility  Supine to sit:    Dep+2  ??  Sit to supine:   Dep+2 Max A x 2  Supine < > sit??                       Mod A  in prep of ADL tasks & transfers   Functional Transfers Sit to stand:   Max A  (2nd person to monitor medical lines and guide hand placement)  Stand to sit:   Max A ??Mod A x 2  Sit <> stand  Side by side assist to improve upright posture                        Mod A  sit<>stand/functional bathroom transfers using AD/DME as needed for balance and safety   Functional Mobility Mod A+2   [facility] ??Mod A x 2  Side stepping                       Mod A   functional/bathroom mobility using AD as needed & demonstrating G safety  ??   Balance Sitting:     Static:  Min A    Dynamic:Mod A  Standing: Max A+ 1-2 Sitting: Mod A  Heavy Left lateral lean, slowly improved Min A  Standing: Mod A x 2  ?? SBA dynamic sitting balance;   Mod A dynamic standing balance  during ADL tasks & transfers   Endurance/Activity Tolerance ??  F tolerance with light to moderate activity.  ??Fair  Fatigues quickly, seated at EOB 15 minutes  G   tolerance with moderate activity/self care routine   Visual/  Perceptual Impaired: visual awareness; body awareness  ??    ?? ??      ??  ??        Education:  Pt was educated through out treatment regarding proper technique & safety with bed mobility, sitting tolerance/balance, functional transfers & mobility, improving cognition deficits and ADL re-training to ease tasks, improve safety & prevent falls to return home safely.      Comments: Upon arrival pt was in bed & agreeable for therapy. At end of session pt was returned to bed HOB upright, heels off loaded, all lines and tubes intact,  call light within reach. PT still present.     ?? Pt has made Fair progress towards set goals.   ?? Continue with current plan of care      Treatment Time In: 3:07            Treatment Time Out: 3:30           Treatment Charges: Mins Units   Ther Ex  97110 8 1   Manual Therapy 97140     Thera Activities 97530 15 1   ADL/Home Mgt 97535      Neuro Re-ed 46270     Group Therapy      Orthotic manage/training  35009     Non-Billable Time     Total Timed Treatment 23 2       Nakyia Dau J. Sherrine Salberg, COTA/L [id number]

## 2020-11-16 MED FILL — LEVETIRACETAM 500 MG PO TABS: 500 mg | ORAL | Qty: 1

## 2020-11-16 MED FILL — HEPARIN SODIUM (PORCINE) 10000 UNIT/ML IJ SOLN: 10000 [IU]/mL | INTRAMUSCULAR | Qty: 1

## 2020-11-16 MED FILL — HYDRALAZINE HCL 20 MG/ML IJ SOLN: 20 mg/mL | INTRAMUSCULAR | Qty: 1

## 2020-11-16 MED FILL — BD POSIFLUSH 0.9 % IV SOLN: 0.9 % | INTRAVENOUS | Qty: 30

## 2020-11-16 MED FILL — BD POSIFLUSH 0.9 % IV SOLN: 0.9 % | INTRAVENOUS | Qty: 10

## 2020-11-16 MED FILL — BD POSIFLUSH 0.9 % IV SOLN: 0.9 % | INTRAVENOUS | Qty: 20

## 2020-11-16 MED FILL — ISOSORBIDE MONONITRATE ER 60 MG PO TB24: 60 MG | ORAL | Qty: 2

## 2020-11-16 MED FILL — CEFTRIAXONE SODIUM 1 G IJ SOLR: 1 g | INTRAMUSCULAR | Qty: 1000

## 2020-11-16 MED FILL — POLYETHYLENE GLYCOL 3350 17 G PO PACK: 17 g | ORAL | Qty: 1

## 2020-11-16 MED FILL — ATORVASTATIN CALCIUM 20 MG PO TABS: 20 mg | ORAL | Qty: 1

## 2020-11-16 NOTE — Progress Notes (Signed)
Physician Progress Note      PATIENTMarland Rhodes               JENNA, ARDOIN  CSN #:                  [id number]  DOB:                       04/04/1929  ADMIT DATE:       11/09/2020 2:12 PM  DISCH DATE:  RESPONDING  PROVIDER #:        Margaret Pyle MD          QUERY TEXT:    Pt admitted with SDH. Pt noted to have WBC 12.9 on admit, 24.3 on day 2, admit   Urine Culture -E. Coli ,  AKI,  HR 92, RR 26, delirium on admit; with Sepsis   noted by urology. If possible, please document in the progress notes and   discharge summary if you are evaluating and /or treating any of the following:    The medical record reflects the following:  Risk Factors: UTI,  AKI, bladder diverticulum, ureteral calculus  Clinical Indicators: WBC 12.9 on admit, 24.3 on day 2, admit Urine Culture -E.   Coli ,  AKI,  HR 92, RR 26 on admit. Urology Op note -Dx "obstruction,   urinary tract infection and sepsis"  Treatment: Rocephin, Cultures, ICU, Oxygen, IVF, consults, cystoscopy/stent    Thank You,  Herb Grays BSN, CRCR, RN, CDS  CDI  [email or web address]  [phone]  Options provided:  -- Sepsis, present on admission  -- Sepsis, present on admission, now resolved  -- Sepsis was ruled out  -- Other - I will add my own diagnosis  -- Disagree - Not applicable / Not valid  -- Disagree - Clinically unable to determine / Unknown  -- Refer to Clinical Documentation Reviewer    PROVIDER RESPONSE TEXT:    Provider is clinically unable to determine a response to this query.    Query created by: Herb Grays on 11/16/2020 11:51 AM      Electronically signed by:  Margaret Pyle MD 11/16/2020 2:53 PM

## 2020-11-16 NOTE — Plan of Care (Signed)
Problem: Pain:  Goal: Pain level will decrease  Description: Pain level will decrease  Outcome: Met This Shift  Goal: Control of acute pain  Description: Control of acute pain  Outcome: Met This Shift  Goal: Control of chronic pain  Description: Control of chronic pain  Outcome: Met This Shift     Problem: Skin Integrity:  Goal: Will show no infection signs and symptoms  Description: Will show no infection signs and symptoms  Outcome: Met This Shift  Goal: Absence of new skin breakdown  Description: Absence of new skin breakdown  Outcome: Met This Shift     Problem: Neurological  Goal: Maximum potential motor/sensory/cognitive function  Outcome: Ongoing

## 2020-11-16 NOTE — Progress Notes (Signed)
OCCUPATIONAL THERAPY BEDSIDE TREATMENT NOTE   [facility]  [address] [place], [place]       Date:11/16/2020  Patient Name: Tyler Rhodes  MRN: [id number]  DOB: 27-Nov-1928  Room: 4514/4514-A     Per OT Eval:    Evaluating OT: Charlie Pitter, OTR/L [phone]  ??  Referring Provider: Renee Rival, MD   Specific Provider Orders/Date: OT eval and treat (11/09/20)     ??     Diagnosis: Left subdural hematoma  ??                  UTI                     PE    Reason for admission: s/p fall at nursing facility with concern for SDH; AMS and R sided weakness  ??  Surgery/Procedures:        3/2  LEFT CRANIOTOMY BURR HOLES FOR SUBDURAL  ??3/3  Cystopanendoscopy, stent insertion on the left   3/3   INFERIOR VENA CAVA FILTER PLACEMENT  ??  Pertinent Medical History: Acute MI, CAD, HLD, HTN, Prostate Ca   ??  *Precautions:  Fall Risk, bed alarm, , , R UE weakness, , HOH, Foley Catheter, Minced/Moist w/ Nectar-thick Liquids  ??  Assessment of current deficits   [x] ? Functional mobility          [x] ?ADLs           [x] ? Strength                  [x] ?Cognition   [x] ? Functional transfers        [x] ? IADLs         [x] ? Safety Awareness   [x] ?Endurance   [x] ? Fine Coordination           [x] ? ROM           [x] ? Vision/perception    [] ?Sensation     [x] ?Gross Motor Coordination [x] ? Balance    [] ? Delirium                  [] ?Motor Control     [x] ? Communication  ??  OT PLAN OF CARE   OT POC based on physician orders, patient diagnosis and results of clinical assessment.  ??     Frequency/Duration: 1-3 days/wk for 1-2 weeks PRN    Specific OT Treatment to include:??  ADL retraining/adapted techniques and AE recommendations to increase functional independence within precautions????????????????????????????????????  Energy conservation techniques to improve tolerance for selfcare routine   Functional transfer/mobility training/DME recommendations for increased independence, safety and fall prevention  ????????????  Patient/family education to increase safety and functional independence within precautions ??????????????????????  Environmental modifications for safe mobility and completion of ADLs??????????????????????????????????????????????????  Cognitive retraining ex's to improve problem solving skills &??safe participation in ADLs/IADLs  Sensory re-education techniques to improve extremity awareness, maintain skin integrity and improve hand function ????????????????????????????????????????????????????  Visual/Perceptual retraining  to improve body awareness and safety during transfers and ADLs  Splinting/positioning needs to maintain joint/skin integrity and prevent contractures  Therapeutic activity to improve functional performance during ADLs/IADLs ????????????????????????????????????????????????????????????????????????????  Therapeutic exercise to improve tolerance and functional strength for ADLs   Balance retraining exercises/tasks for facilitation of postural control with dynamic challenges during ADLs .  Positioning to improve functional independence  Neuromuscular re-education: facilitation of righting/equilibrium reactions, normalization muscle tone/facilitation active functional movement????????????????????????????????????????  Delirium prevention/treatment  ??  Modified Rankin Scale  Score     Description  0             No symptoms  1             No significant disability despite symptoms  2             Slight disability; able to look after own affairs  3             Moderate disability; able to ambulate without assist/ requires assist with ADLs  4             Moderate/Severe disability;requires assist to ambulate/assist with ADLs  5             Severe disability;bedridden/incontinent   6             Deceased  Score:   5  ??  Recommended Adaptive Equipment: TBA: AD, ADL AE pending progress   ??  Home Living: Pt admitted from  S[facility] nursing facility. Pt is a poor historian and not able to report PLOF due to cognitive deficits.   Prior Level of Function: Pt not able to report.   Per old chart:    Occupation: retired, Marine scientist at  [employer]??  ??Interest &??Hobby: traveling and cruises??  ??  Pain Level: pt c/o 0/10  pain  this session; no c/o pain during session  ??  Cognition: A&O: 1/4  (self only)  Follows gross 1 step commands with Max VCs/tactile cues, increased time, pleasant & cooperative, repeated cues needed due to [facility], increased time for processing, mod delay              Memory: poor             Comprehension poor simple instruction             Problem solving: poor             Judgement/safety: poor  ??             Communication skills: simple responses, HOH - Mod VCs and extended time to state his name "Tyler Rhodes", to answer Yes/No questions - able to answer appropriately 3/10x w/ simple 1-word answers             Vision: visual tracking grossly intact                    Glasses:none present                                                       Hearing: HOH               UE Assessment:  Hand Dominance: Right [x] ?  Left [] ?  ??  ?? ROM Strength STM goal: PRN   RUE  Limited shoulder flexion; tightness.   A/AROM: grossly 45; WFL distal with impaired grasp and FMC 2-/5 proximal  3-/5 distal WFL for ADLS; 3+/5  ??   LUE A/AROM grossly 90; WFL distal with impaired and FMC 3+/5 WFL for ADLS; 4-/5  ??   ??  Sensation: No c/o numbness/tingling in extremities. *contnue to assess  Tone: WNL   Edema: [facility]     Functional Assessment:  AM-PAC Daily Activity Raw Score: 8/24  ?? Initial Eval Status  Date: 3/4 Treatment Status  Date:  11/15/20 STGs =  LTGs  Time frame: 7-14 days   Feeding Dep/NGT Diet advanced this date to Minced/Moist w/ Nectar-thick Liquids    NT this session Min A   Grooming Dep  (HOH assist to initiate tasks- hand washing and holding onto wash cloth to wash face)  Pt required Max A to bring B UE's to midline ??Max A    Max A + Max VCs and Tactile Cues, hand-over-hand assist to initiate washing hands/face in high fowler position     Chronic limited ROM R shoulder                        Mod A   while seated supported and using B UE's as fair functional  assists during light tasks  ??   UB dressing/bathing Dep Max A, Max VCs    Donning/doffing gown in high fowler position                          Mod A  ??  ??   LB dressing/bathing Dep NT                       Max A   using AE as needed for safe reach/ energy conservation    ??   Toileting Dep Dep    Foley present ??  Incontinent of Bowel - Max A for hygiene + Max A for bed mobility to facilitate functional ax, Max VCs                         Max A  ??   Bed Mobility  Supine to sit:   Dep+2  ??  Sit to supine:   Dep+2 Rolling to facilitate functional ax:  Max A, Max VCs, Hand-over-hand assist for functional reaching to side-rail    Supine < > sit??NT                       Mod A  in prep of ADL tasks & transfers   Functional Transfers Sit to stand:   Max A  (2nd person to monitor medical lines and guide hand placement)  Stand to sit:   Max A NT                       Mod A  sit<>stand/functional bathroom transfers using AD/DME as needed for balance and safety   Functional Mobility Mod A+2   side step to [facility] ??NT                      Mod A   functional/bathroom mobility using AD as needed & demonstrating G safety  ??   Balance Sitting:     Static:  Min A    Dynamic:Mod A  Standing: Max A+ 1-2 NT  ?? SBA dynamic sitting balance;   Mod A dynamic standing balance  during ADL tasks & transfers   Endurance/Activity Tolerance ??  F tolerance with light to moderate activity.  ??Fair(-)   G   tolerance with moderate activity/self care routine   Visual/  Perceptual Impaired: visual awareness; body awareness  ??    ?? ??      ??  ??        Education:  Pt was educated through out treatment regarding proper technique & safety with bed mobility, improving cognition deficits  and ADL re-training to ease tasks, improve safety & prevent falls to return home safely.      Comments: Upon arrival, pt was found in supine, incontinent of bowel, agreeable for therapy.   At end of session, pt was properly positioned in high fowler position, Bil LEs elevated,  all lines and tubes intact, call light within reach.     ?? Pt has made Fair progress towards set goals.   ?? Continue with current plan of care      Treatment Time In: 1540  Treatment Time Out: 1604       Treatment Charges: Mins Units   Ther Ex  97110     Manual Therapy 97140     Thera Activities 97530     ADL/Home Mgt 97535 24 2   Neuro Re-ed 97112     Group Therapy      Orthotic manage/training  [id number]     Non-Billable Time     Total Timed Treatment 24 2       Garey HamJennifer Catarina Huntley, [place], OTR/L  # [phone]

## 2020-11-16 NOTE — Progress Notes (Signed)
Department of Neurosurgery  Progress Note    CHIEF COMPLAINT: s/p left burr hole 3/2    SUBJECTIVE: Tired today but able to be aroused. Following commands.      REVIEW OF SYSTEMS :  Constitutional: Negative for chills and fever.    Neurological: Negative for dizziness, tremors and speech change.     OBJECTIVE:   VITALS:  BP 135/71    Pulse 85    Temp 97.5 ??F (36.4 ??C) (Oral)    Resp 20    Ht 6\' 2"  (1.88 m)    Wt 220 lb 3.2 oz (99.9 kg)    SpO2 95%    BMI 28.27 kg/m??     PHYSICAL:  Neurologic: Alert and oriented to self and place  PERRL  Motor Exam:  Following commands, Motor exam 4/5 on right, 5/5 on left  Sensory:  Sensory intact  Incision c/d/i      DATA:  CBC:   Lab Results   Component Value Date    WBC 11.1 11/15/2020    RBC 4.47 11/15/2020    HGB 12.5 11/15/2020    HCT 39.3 11/15/2020    MCV 87.9 11/15/2020    MCH 28.0 11/15/2020    MCHC 31.8 11/15/2020    RDW 14.9 11/15/2020    PLT 243 11/15/2020    MPV 11.6 11/15/2020     BMP:    Lab Results   Component Value Date    NA 137 11/15/2020    K 4.1 11/15/2020    CL 102 11/15/2020    CO2 23 11/15/2020    BUN 15 11/15/2020    LABALBU 2.8 11/15/2020    LABALBU 4.5 01/06/2011    CREATININE 0.8 11/15/2020    CALCIUM 8.3 11/15/2020    GFRAA >60 11/15/2020    LABGLOM >60 11/15/2020    GLUCOSE 88 11/15/2020    GLUCOSE 107 01/08/2011     PT/INR:    Lab Results   Component Value Date    PROTIME 13.3 11/09/2020    PROTIME 13.0 01/06/2011    INR 1.2 11/09/2020     PTT:    Lab Results   Component Value Date    APTT 27.9 11/09/2020   [APTT}    Current Inpatient Medications  Current Facility-Administered Medications: atorvastatin (LIPITOR) tablet 20 mg, 20 mg, Oral, Daily  polyethylene glycol (GLYCOLAX) packet 17 g, 17 g, Oral, Daily  levETIRAcetam (KEPPRA) tablet 500 mg, 500 mg, Oral, BID  acetaminophen (TYLENOL) tablet 650 mg, 650 mg, Oral, Q4H PRN  pantoprazole (PROTONIX) tablet 40 mg, 40 mg, Oral, QAM AC  heparin (porcine) injection 5,000 Units, 5,000 Units, SubCUTAneous, 3  times per day  bisacodyl (DULCOLAX) suppository 10 mg, 10 mg, Rectal, Daily PRN  hydrALAZINE (APRESOLINE) injection 10 mg, 10 mg, IntraVENous, Q4H PRN  labetalol (NORMODYNE;TRANDATE) injection 10 mg, 10 mg, IntraVENous, Q4H PRN  sodium chloride flush 0.9 % injection 5-40 mL, 5-40 mL, IntraVENous, 2 times per day  cefTRIAXone (ROCEPHIN) 1,000 mg in sterile water 10 mL IV syringe, 1,000 mg, IntraVENous, Q24H  sodium chloride flush 0.9 % injection 5-40 mL, 5-40 mL, IntraVENous, PRN  isosorbide mononitrate (IMDUR) extended release tablet 120 mg, 120 mg, Oral, Daily  ondansetron (ZOFRAN-ODT) disintegrating tablet 4 mg, 4 mg, Oral, Q8H PRN **OR** ondansetron (ZOFRAN) injection 4 mg, 4 mg, IntraVENous, Q6H PRN    ASSESSMENT:   s/p left burr hole 3/2,   UTI    PLAN:  -Continue ICU care  -HOB to comfort  -Serial neurological exams  -  No AC or AP  - ok to begin proph lovenox   -Repeat head CT scan not significantly changed  -Discussed with family about patient's current condition and prognosis      Electronically signed by Wenda Low, PA-C on 11/16/2020 at 2:42 PM

## 2020-11-16 NOTE — Plan of Care (Signed)
Problem: Skin Integrity:  Goal: Absence of new skin breakdown  Description: Absence of new skin breakdown  11/16/2020 2337 by Vernelle Emerald, RN  Outcome: Met This Shift  11/16/2020 1858 by Judyann Munson, RN  Outcome: Met This Shift

## 2020-11-16 NOTE — Progress Notes (Signed)
SPEECH LANGUAGE PATHOLOGY  DAILY PROGRESS NOTE        PATIENT NAME:  Tyler Rhodes      DOB:  September 16, 1928          TODAY'S DATE:  11/16/2020 ROOM:  4514/4514-A    Patient seen for ongoing dysphagia management therapy. Pt was alert and oriented. Pt was pleasant and demonstrated increased participation in therapy. Pt was given thin liquids by spoon and from the cup. Pt was demonstrating questionable s/s of penetration/aspiration. Pt was making throat clearing noises that were concerning to the SLP. Presented patient with nectar thickened apple juice from the cup. No overt s/s of aspiration were observed with nectar thick liquid. Pt consumed several bites of small pieces of jello, from the spoon. Pt displayed good bolus control,mastication, and had no overt s/s of penetration/aspiration. It is recommended pt's diet be upgraded to Minced and moist consistency solids (IDDSI level 5) with  nectar consistency (mildly thick - IDDSI level 2) liquids.        CPT code(s) 92526  dysphagia tx  Total minutes :  30 minutes    Drinda Butts B.S.  Speech Language Pathology Graduate Student

## 2020-11-16 NOTE — Progress Notes (Signed)
[facility]  PROGRESS NOTE  ATTENDING NOTE        TRAUMA  MECHANISM:  fall    Chief Complaint   Patient presents with   ??? Head Injury     transfer from [place], large SDH  change in mental status       Head Injury   Pertinent negatives include no headaches, vomiting or weakness.     The patient is a 87 yo male who sustained a fall  at approximately earlier today at [facility].patient is confused and is a poor historian. History obtained from chart review. Per report, pt was at PT this AM when he had right sided weakness and confusion. He fell and hit his head.    Pain does not radiate. There are no alleviating or worsening factors regarding the pain.  Pt had a fall back in January and had multiple rib fx and has been declining sincce then  ??    Patient Active Problem List   Diagnosis   ??? Elevated serum cholesterol   ??? Hyperlipidemia   ??? Acute MI (HCC)   ??? Sepsis (HCC)   ??? Arthritis of knee, right   ??? Anemia   ??? Acute on chronic intracranial subdural hematoma (HCC)   ??? Single subsegmental pulmonary embolism without acute cor pulmonale (HCC)   ??? Delirium   ??? Slurred speech   ??? Hydronephrosis with urinary obstruction due to ureteral calculus   ??? Pleural effusion   ??? Subdural hemorrhage (HCC)   ??? Palliative care by specialist       SUBJECTIVE/OVERNIGHT EVENTS:  Expressive aphasia overnight--repeat CT head done.  NSGY notified    Review of Systems   Constitutional: Positive for activity change. Negative for appetite change and unexpected weight change.   HENT: Negative.    Eyes: Negative.    Respiratory: Negative.  Negative for cough and shortness of breath.    Cardiovascular: Negative.  Negative for chest pain and leg swelling.   Gastrointestinal: Negative.  Negative for abdominal distention, abdominal pain, anal bleeding, blood in stool, constipation, diarrhea, nausea and vomiting.   Endocrine: Negative.    Genitourinary: Negative.    Musculoskeletal: Negative.  Negative for arthralgias, back  pain, gait problem, joint swelling and myalgias.   Skin: Negative.    Allergic/Immunologic: Negative.    Neurological: Negative.  Negative for dizziness, weakness and headaches.   Hematological: Negative.    Psychiatric/Behavioral: Positive for confusion. Negative for decreased concentration and sleep disturbance.       BP (!) 98/50    Pulse 96    Temp 97.6 ??F (36.4 ??C) (Oral)    Resp 20    Ht 6\' 2"  (1.88 m)    Wt 220 lb 3.2 oz (99.9 kg)    SpO2 94%    BMI 28.27 kg/m??   Physical Exam  Constitutional:       Appearance: Normal appearance.   HENT:      Head: Normocephalic and atraumatic.      Nose: Nose normal.      Mouth/Throat:      Mouth: Mucous membranes are moist.      Pharynx: Oropharynx is clear.   Eyes:      Extraocular Movements: Extraocular movements intact.      Pupils: Pupils are equal, round, and reactive to light.   Cardiovascular:      Rate and Rhythm: Normal rate and regular rhythm.      Pulses: Normal pulses.      Heart  sounds: Normal heart sounds.   Pulmonary:      Effort: Pulmonary effort is normal.      Breath sounds: Normal breath sounds.   Abdominal:      General: There is no distension.      Palpations: Abdomen is soft.      Tenderness: There is no abdominal tenderness.   Musculoskeletal:         General: No tenderness or signs of injury.      Cervical back: Normal range of motion and neck supple.   Skin:     General: Skin is warm and dry.   Neurological:      General: No focal deficit present.      Mental Status: He is alert.      Comments: Generalize weakness         ASSESSMENT/PLAN:  1.  TBI--s/p burr holes, NSGY following, keppra, monitor Na, SBP  2.  PE--IVCF  3.  Hydronephrosis--s/p cysto and left ureteral stent    INCIDENTAL FINDINGS:  As above    AMPAC:  9/24    DVT/GI ppx:  IVCF/PPI    Ok for SNF    Redge Gainer, MD, MSc, [facility]  11/16/2020  6:34 PM

## 2020-11-16 NOTE — Care Coordination-Inpatient (Signed)
11/16/2020 - S/P??3/2 Left fronto/temporal burr hole and subdural drain placement. S/P 3/3 vena cava filter placement. S/P cysto with left stent insertion. Neurosurgery, urology, general surgery following. IV LR at 75 cc/hr. IV rocephin q24h. Pt has been out of restraints for greater than 24 hours - sent message to [place] [facility]. She will submit for precert today. Will also need a covid test done within 24 hours of discharge. Ambulance form completed and placed in envelope on soft chart. SW/CM will follow.

## 2020-11-17 LAB — CBC WITH AUTO DIFFERENTIAL
Basophils %: 0.9 % (ref 0.0–2.0)
Basophils Absolute: 0.1 E9/L (ref 0.00–0.20)
Eosinophils %: 0.7 % (ref 0.0–6.0)
Eosinophils Absolute: 0 E9/L — ABNORMAL LOW (ref 0.05–0.50)
Hematocrit: 35.5 % — ABNORMAL LOW (ref 37.0–54.0)
Hemoglobin: 11.5 g/dL — ABNORMAL LOW (ref 12.5–16.5)
Lymphocytes %: 33.9 % (ref 20.0–42.0)
Lymphocytes Absolute: 3.94 E9/L (ref 1.50–4.00)
MCH: 28.3 pg (ref 26.0–35.0)
MCHC: 32.4 % (ref 32.0–34.5)
MCV: 87.2 fL (ref 80.0–99.9)
MPV: 11.3 fL (ref 7.0–12.0)
Monocytes %: 10.4 % (ref 2.0–12.0)
Monocytes Absolute: 1.16 E9/L — ABNORMAL HIGH (ref 0.10–0.95)
Myelocyte Percent: 0.9 % (ref 0–0)
Neutrophils %: 53.9 % (ref 43.0–80.0)
Neutrophils Absolute: 6.38 E9/L (ref 1.80–7.30)
Platelets: 225 E9/L (ref 130–450)
RBC: 4.07 E12/L (ref 3.80–5.80)
RDW: 14.9 fL (ref 11.5–15.0)
WBC: 11.6 E9/L — ABNORMAL HIGH (ref 4.5–11.5)

## 2020-11-17 LAB — BASIC METABOLIC PANEL
Anion Gap: 16 mmol/L (ref 7–16)
BUN: 17 mg/dL (ref 6–23)
CO2: 21 mmol/L — ABNORMAL LOW (ref 22–29)
Calcium: 8 mg/dL — ABNORMAL LOW (ref 8.6–10.2)
Chloride: 104 mmol/L (ref 98–107)
Creatinine: 0.8 mg/dL (ref 0.7–1.2)
GFR African American: >60
GFR Non-African American: >60 mL/min/{1.73_m2} (ref >=60)
Glucose: 99 mg/dL (ref 74–99)
Potassium: 3.6 mmol/L (ref 3.5–5.0)
Sodium: 141 mmol/L (ref 132–146)

## 2020-11-17 LAB — URINALYSIS WITH MICROSCOPIC
Glucose, Ur: NEGATIVE mg/dL
Ketones, Urine: 15 mg/dL — AB
Nitrite, Urine: NEGATIVE
Protein, UA: 100 mg/dL — AB
RBC, UA: >20 /HPF (ref 0–2)
Specific Gravity, UA: 1.025 (ref 1.005–1.030)
Urobilinogen, Urine: 0.2 E.U./dL (ref <2.0)
pH, UA: 6 (ref 5.0–9.0)

## 2020-11-17 MED FILL — BD POSIFLUSH 0.9 % IV SOLN: 0.9 % | INTRAVENOUS | Qty: 10

## 2020-11-17 MED FILL — LEVETIRACETAM 500 MG PO TABS: 500 mg | ORAL | Qty: 1

## 2020-11-17 MED FILL — HEPARIN SODIUM (PORCINE) 10000 UNIT/ML IJ SOLN: 10000 [IU]/mL | INTRAMUSCULAR | Qty: 1

## 2020-11-17 MED FILL — PANTOPRAZOLE SODIUM 40 MG PO TBEC: 40 mg | ORAL | Qty: 1

## 2020-11-17 MED FILL — ATORVASTATIN CALCIUM 20 MG PO TABS: 20 mg | ORAL | Qty: 1

## 2020-11-17 MED FILL — HYDRALAZINE HCL 20 MG/ML IJ SOLN: 20 mg/mL | INTRAMUSCULAR | Qty: 1

## 2020-11-17 MED FILL — POLYETHYLENE GLYCOL 3350 17 G PO PACK: 17 g | ORAL | Qty: 1

## 2020-11-17 MED FILL — ISOSORBIDE MONONITRATE ER 60 MG PO TB24: 60 MG | ORAL | Qty: 2

## 2020-11-17 NOTE — Plan of Care (Signed)
Problem: Pain:  Goal: Pain level will decrease  Description: Pain level will decrease  Outcome: Met This Shift     Problem: Pain:  Goal: Control of acute pain  Description: Control of acute pain  Outcome: Met This Shift     Problem: Pain:  Goal: Control of chronic pain  Description: Control of chronic pain  Outcome: Met This Shift     Problem: Skin Integrity:  Goal: Will show no infection signs and symptoms  Description: Will show no infection signs and symptoms  Outcome: Met This Shift     Problem: Skin Integrity:  Goal: Absence of new skin breakdown  Description: Absence of new skin breakdown  Outcome: Met This Shift     Problem: Pain:  Goal: Pain level will decrease  Description: Pain level will decrease  Outcome: Met This Shift     Problem: Pain:  Goal: Recognizes and communicates pain  Description: Recognizes and communicates pain  Outcome: Met This Shift     Problem: Respiratory Function - Compromised:  Goal: Absence of pulmonary infection  Description: Absence of pulmonary infection  Outcome: Met This Shift     Problem: Respiratory Function - Compromised:  Goal: Levels of oxygenation will improve  Description: Levels of oxygenation will improve  Outcome: Met This Shift     Problem: Respiratory Function - Compromised:  Goal: Increase in ventilator-free time  Description: Increase in ventilator-free time  Outcome: Met This Shift     Problem: Respiratory Function - Compromised:  Goal: Ability to maintain a clear airway will improve  Description: Ability to maintain a clear airway will improve  Outcome: Met This Shift     Problem: Bowel Function - Altered:  Goal: Bowel elimination is within specified parameters  Description: Bowel elimination is within specified parameters  Outcome: Met This Shift     Problem: Bowel Function - Altered:  Goal: Control of bowel function will improve  Description: Control of bowel function will improve  Outcome: Met This Shift     Problem: Bowel Function - Altered:  Goal: Ability  to maintain normal bowel function will improve  Description: Ability to maintain normal bowel function will improve  Outcome: Met This Shift     Problem: Urinary Elimination - Impaired:  Goal: Urinary elimination within specified parameters  Description: Urinary elimination within specified parameters  Outcome: Met This Shift

## 2020-11-17 NOTE — Progress Notes (Signed)
Department of Neurosurgery  Progress Note    CHIEF COMPLAINT: s/p left burr hole 3/2    SUBJECTIVE: resting peacefully.  Speech garbled, able to arouse.    REVIEW OF SYSTEMS :  Not answering all questions appropriately    OBJECTIVE:   VITALS:  BP (!) 141/57    Pulse 68    Temp 98.1 ??F (36.7 ??C) (Oral)    Resp 20    Ht 6\' 2"  (1.88 m)    Wt 220 lb 3.2 oz (99.9 kg)    SpO2 96%    BMI 28.27 kg/m??     PHYSICAL:  Neurologic: lethargic, oriented to self   PERRL  Motor Exam:  MAE.  Following some commands  Sensory:  Sensory intact  Incision c/d/i      DATA:  CBC:   Lab Results   Component Value Date    WBC 11.1 11/15/2020    RBC 4.47 11/15/2020    HGB 12.5 11/15/2020    HCT 39.3 11/15/2020    MCV 87.9 11/15/2020    MCH 28.0 11/15/2020    MCHC 31.8 11/15/2020    RDW 14.9 11/15/2020    PLT 243 11/15/2020    MPV 11.6 11/15/2020     BMP:    Lab Results   Component Value Date    NA 137 11/15/2020    K 4.1 11/15/2020    CL 102 11/15/2020    CO2 23 11/15/2020    BUN 15 11/15/2020    LABALBU 2.8 11/15/2020    LABALBU 4.5 01/06/2011    CREATININE 0.8 11/15/2020    CALCIUM 8.3 11/15/2020    GFRAA >60 11/15/2020    LABGLOM >60 11/15/2020    GLUCOSE 88 11/15/2020    GLUCOSE 107 01/08/2011     PT/INR:    Lab Results   Component Value Date    PROTIME 13.3 11/09/2020    PROTIME 13.0 01/06/2011    INR 1.2 11/09/2020     PTT:    Lab Results   Component Value Date    APTT 27.9 11/09/2020   [APTT}    Current Inpatient Medications  Current Facility-Administered Medications: atorvastatin (LIPITOR) tablet 20 mg, 20 mg, Oral, Daily  polyethylene glycol (GLYCOLAX) packet 17 g, 17 g, Oral, Daily  levETIRAcetam (KEPPRA) tablet 500 mg, 500 mg, Oral, BID  acetaminophen (TYLENOL) tablet 650 mg, 650 mg, Oral, Q4H PRN  pantoprazole (PROTONIX) tablet 40 mg, 40 mg, Oral, QAM AC  heparin (porcine) injection 5,000 Units, 5,000 Units, SubCUTAneous, 3 times per day  bisacodyl (DULCOLAX) suppository 10 mg, 10 mg, Rectal, Daily PRN  hydrALAZINE (APRESOLINE)  injection 10 mg, 10 mg, IntraVENous, Q4H PRN  labetalol (NORMODYNE;TRANDATE) injection 10 mg, 10 mg, IntraVENous, Q4H PRN  sodium chloride flush 0.9 % injection 5-40 mL, 5-40 mL, IntraVENous, 2 times per day  sodium chloride flush 0.9 % injection 5-40 mL, 5-40 mL, IntraVENous, PRN  isosorbide mononitrate (IMDUR) extended release tablet 120 mg, 120 mg, Oral, Daily  ondansetron (ZOFRAN-ODT) disintegrating tablet 4 mg, 4 mg, Oral, Q8H PRN **OR** ondansetron (ZOFRAN) injection 4 mg, 4 mg, IntraVENous, Q6H PRN    ASSESSMENT:   s/p left burr hole 3/2,       PLAN:  -HOB to comfort  -Serial neurological exams  -No AC or AP  -urinalysis, BMP, CBC  -Repeat head CT scan not significantly changed  -Discussed with family about patient's current condition and prognosis, they will cont thinking about proceed with 2nd burr hole vs full craniotomy vs observation  Electronically signed by Duffy Bruce, PA on 11/17/2020 at 9:54 AM

## 2020-11-17 NOTE — Discharge Instructions (Signed)
TRAUMA SERVICES DISCHARGE INSTRUCTIONS    Call [phone], option 2, for any questions/concerns and for follow-up appointment in 2 week(s).    Please follow the instructions checked below:  Please follow-up with your primary care provider.    ACTIVITY INSTRUCTIONS  Increase activity as tolerated  No heavy lifting or strenuous activity  Take your incentive spirometer home and use 4-6 times/day   [x]   No driving until cleared by Neurosurgery    WOUND/DRESSING INSTRUCTIONS:  You may shower.  No sitting in bath tub, hot tub or swimming until cleared by physician.  Ice to areas of pain for first 24 hours.  Heat to areas of pain after that.    MEDICATION INSTRUCTIONS  Take medication as prescribed.  When taking pain medications, you may experience dizziness or drowsiness.  Do not drink alcohol or drive when taking these medications.  You may experience constipation while taking pain medication.  You may take over the counter stool softeners such as docusate (Colace), sennosides S (Senokot-S), or Miralax.   [x]   You may take acetaminophen (Tylenol) products.  Do NOT take more than 4000mg  of Tylenol in 24h.    CALL 911 OR YOUR LOCAL EMERGENCY SERVICE:  --If you take too much medication  --If you have trouble breathing or shortness of breath  --A child has taken this medication.    WORK:  You may not return to work until you receive follow-up with the Trauma Clinic or clearance by all consultants.    Call the trauma clinic for any of the following or for questions/concerns;  --fever over 101F  --redness, swelling, hardness or warmth at the wound site(s).  --Unrelieved nausea/vomiting  --Foul smelling or cloudy drainage at the wound site(s)  --Unrelieved pain or increase in pain  --Increase in shortness of breath    Follow-up:  Trauma Clinic: [phone]--press option 2  [facility]  [address]  [place],  [address]          MILD TRAUMATIC BRAIN INJURY OR CONCUSSION  A mild traumatic  brain injury (TBI) is one that causes some degree of injury to the brain causing symptoms ranging from a brief period of confusion to loss of consciousness (being knocked out).   There is no major bruising or bleeding in the brain but symptoms can last from hours to months depending on the severity of the injury. Family or friends need to observe any change in behavior for the next 48 hours.  Delayed effects from head injury do occur occasionally and can be due to slow bleeding or swelling around the surface of the brain.     These effects may occur even if the x-rays/CT scans were normal.  Please observe the following symptoms during the next 24-48 hours.    CALL 911 if:  [place] Pupils (black part in the center of the eye) are unequal in size, and this is new.  . Seizure (convulsion).  . Not responding to others/won't wake up or very hard to wake up  . Faints (passes out)  . Vomiting more than 3 times    Notify the TRAUMA CLINIC if any of the following symptoms occur:  . Severe headache -- Mild headache may last for days. Report worsening pain or uncontrolled pain with prescribed medicine.  . Numbness, tingling or weakness -- Present in arms or legs; unsteady walking.  . Eye Changes/light sensation -- Vision problems; blurred or double-vision; unequal sized pupils.  . Nausea/Vomiting -- Episodes of  vomiting may occur initially after a head injury.  Persistent vomiting or difficulty taking medication by mouth.  . Increased Sleepiness -- Difficulty waking from sleep with increased confusion.  . Dizziness -- Does not go away or occurs repeatedly.  Vomiting may accompany dizziness.  . Drainage -- Clear fluid or blood from the nose and ears.  . Fever -- Temperature over 101 degrees.  . Neck Pain.    The First Four Weeks  The symptoms below are common after a mild brain injury. They usually get better on their own within a few weeks:   . feeling tired or ?low"  . problems falling or staying asleep  . feeling confused, poor  concentration, or slow to answer questions  . feeling dizzy, poor balance, or poor coordination  . being sensitive to light  . being sensitive to sounds  . ringing in the ears  . a mild headache, sometimes with nausea and/or vomiting  . being irritable, having mood swings, or feeling somewhat sad or "down"  Contact the TRAUMA CLINIC if your symptoms are affecting your everyday activities. Remember that letting yourself get too tired can make your symptoms worse. Listen to your body: if doing a certain activity increases your symptoms, take a break from that activity. Build up the amount of time you do an activity and stay under the threshold of symptoms.     Long term Effects (Post-Concussive Syndrome)    Notify physician if any of the following persists longer than 2-4 weeks.    . Difficulty with concentration or attention (easily distracted)  . Frequent headaches  . Memory problems   . Sensitivity to light   . Sleeping difficulties      There is a higher risk of having a more serious head injury if:  Marland Kitchen. Previous history of head injury or concussion  . Taking medicine that thins your blood, or have a bleeding disorder  . Have other neurologic (brain) problems  . Have difficulty walking or frequent falls  . Active in high impact contact sports, like soccer or football.    Activities  . Stay away from activities that could cause another head injury (like sports), until the doctor says it's okay. A second blow to the head can cause more damage to the brain  . Limit reading, television, video games, etc. the first 48 hours. Your brain needs to rest so that it can recover. You may find that it helps to take time off school or work.   . Limit exposure to bright lights, loud noises, and crowds for the first 48 hours, as these can make your symptoms worse  . Limit use of screens, such as an IPad, computer, cellphone, TV, etc, as these can make symptoms, especially headaches, worse.      Work/School  . It is recommended that  you wait to return to work/school until after your follow-up appointment with trauma or your family doctor.  If you are having no symptoms, please call for an earlier appointment  . Some people find it hard to concentrate well so return to your normal activities slowly. Go back to work or school for half days at first, and increase as tolerated. Trauma services can help you with a graduated schedule.  . If you feel comfortable doing so, tell work or school about your concussion. You may have to adjust your activities, depending on your job or school demands.       Rest and Sleep  . Rest for  the first 24 hours; it's one of the best things to help your brain recover.  It's okay to sleep if you want  . You don't have to be woken up every few hours.  If someone does wake you up, you should awaken easily.    . Do some light physical activity (housework) or light exercise (walking, stationary bike) as soon as you can tolerate the movement.  Strenuous exercise (such as jogging or weight lifting) can make your concussion symptoms worse or last longer.    Diet  . Return to a normal diet as tolerated, you may want to start with liquids first  . Eat healthy meals (including breakfast) and snacks throughout the day as your brain heals.    Managing Pain  . Tylenol or Ibuprofen are the best meds to take for headaches.  . Your doctor may have prescribed you medications if your headaches were severe or you have other injuries.  Please take as directed.  Driving  . Your ability to concentrate and react quickly might be affected by the concussion.  Please contact trauma services for advice on when to resume driving.  . Do not drive if you're concerned about vision problems, slowed thinking, slowed reaction time, reduced attention or poor judgment.  . Wear sunglasses even during winter if sunny while driving.  The bright light may induce a headache.     Alcohol use/Drug use  . Don't drink alcohol or use recreational drugs as they may  make you feel worse and/or hide the warning signs.        Follow-up:  Trauma Clinic: [phone] -- press option 2   [facility]  [address]  [place], [place]  [address]      SPECIAL CONSIDERATIONS FOR OUR PATIENTS OVER THE AGE OF 65Y    Getting around your home safely can be a challenge if you have injuries or health problems that make it easy for you to fall. Loose rugs and furniture in walkways are among the dangers for many older people who have problems walking or who have poor eyesight. People who have conditions such as arthritis, osteoporosis, or dementia also must be careful not to fall.    You can make your home safer with a few simple measures.    Follow-up care is a key part of your treatment and safety. Be sure to make and go to all appointments, and call your doctor or nurse call line if you are having problems. It's also a good idea to know your test results and keep a list of the medicines you take.    How can you care for yourself at home?  Taking care of yourself  . You may get dizzy if you do not drink enough water. To prevent dehydration, drink plenty of fluids, enough so that your urine is light yellow or clear like water. Choose water and other caffeine-free clear liquids. If you have kidney, heart, or liver disease and have to limit fluids, talk with your doctor before you increase the amount of fluids you drink.  . Exercise regularly to improve your strength, muscle tone, and balance. Walk if you can. Swimming may be a good choice if you cannot walk easily.  . Have your vision and hearing checked each year or any time you notice a change. If you have trouble seeing and hearing, you might not be able to avoid objects and could lose your balance.  . Know the side effects  of the medicines you take. Ask your doctor or pharmacist whether the medicines you take can affect your balance. Sleeping pills or sedatives can affect your balance.  . Limit the amount of alcohol  you drink. Alcohol can impair your balance and other senses.  . Ask your doctor whether calluses or corns on your feet need to be removed. If you wear loose-fitting shoes because of calluses or corns, you can lose your balance and fall.  . Talk to your doctor if you have numbness in your feet.    Preventing falls at home  . Remove raised doorway thresholds, throw rugs, and clutter. Repair loose carpet or raised areas in the floor.  . Move furniture and electrical cords to keep them out of walking paths.  . Use non-skid floor wax, and wipe up spills right away, especially on ceramic tile floors.  . If you use a walker or cane, put rubber tips on it. If you use crutches, clean the bottoms of them regularly with an abrasive pad, such as steel wool.  Marland Kitchen Keep your house well lit, especially stairways, porches, and outside walkways. Use night-lights in areas such as hallways and washrooms. Add extra light switches or use remote switches (such as switches that go on or off when you clap your hands) to make it easier to turn lights on if you have to get up during the night.  Lilian Kapur sturdy handrails on stairways.  . Move items in your cabinets so that the things you use a lot are on the lower shelves (about waist level).  Marland Kitchen Keep a cordless phone and a flashlight with new batteries by your bed. If possible, put a phone in each of the main rooms of your house, or carry a cell phone in case you fall and cannot reach a phone. Or, you can wear a device around your neck or wrist. You push a button that sends a signal for help.  . Wear low-heeled shoes that fit well and give your feet good support. Use footwear with non-skid soles. Check the heels and soles of your shoes for wear. Repair or replace worn heels or soles.  . Do not wear socks without shoes on wood floors.  . Walk on the grass when the sidewalks are slippery. If you live in an area that gets snow and ice in the winter, sprinkle salt on slippery steps and  sidewalks.    Preventing falls in the bath  . Install grab bars and non-skid mats inside and outside your shower or tub and near the toilet and sinks.  . Use shower chairs and bath benches.  . Use a hand-held shower head that will allow you to sit while showering.  . Get into a tub or shower by putting the weaker leg in first. Get out of a tub or shower with your strong side first.  . Repair loose toilet seats and consider installing a raised toilet seat to make getting on and off the toilet easier.  Marland Kitchen Keep your washroom door unlocked while you are in the shower.    Follow-up:  Trauma Clinic: [phone]--press option 2  [facility]  [address]  [place], [place]  [address]           A ureteral stent was inserted during your recent procedure.  Unlike a heart "stent" which is metal, short, and permanent, this ureteral stent plastic, and temporary.  It spans from your kidney, down the ureter, and  into your bladder.  This will need to be removed, so it is very important that you follow-up with your doctor.    IMPORTANT - This ureteral stent will likely cause you to experience frequent urination, urgency to urinate, back/flank pain with urination, and/or blood in the urine.  These things are very normal.  Taking the pain medications and/or anti-inflammatories will help to manage this discomfort if present.  If you have any questions or concerns you can contact [facility] office at [phone].     Ureteral Stent Placement: What to Expect at Home  Your Recovery     A ureteral (say "you-REE-ter-ul") stent is a thin, hollow tube that is placed in the ureter to help urine pass from the kidney into the bladder. Ureters are the tubes that connect the kidneys to the bladder.  You may have a small amount of blood in your urine for 1 to 3 days after the procedure.  While the stent is in place, you may have to urinate more often, feel a sudden need to urinate, or feel like you can't completely  empty your bladder. You may feel some pain when you urinate or do strenuous activity. You also may notice a small amount of blood in your urine after strenuous activities. These side effects usually don't prevent people from doing their normal daily activities.  You may have a thin string coming out of your urethra. Your urethra is the tube that carries urine from your bladder to outside your body. This string is attached to the stent. Try not to pull on the string. It will be used to pull out the stent when you no longer need it.  After the procedure, urine may flow better from your kidneys to your bladder. A ureteral stent may be left in place for several days or for as long as several months. Your doctor will take it out when you no longer need it. Or, in some cases, it may be taken out at home.  This care sheet gives you a general idea about how long it will take for you to recover. But each person recovers at a different pace. Follow the steps below to get better as quickly as possible.  How can you care for yourself at home?  Activity     Rest when you feel tired. Getting enough sleep will help you recover.      Avoid strenuous activities, such as bicycle riding, jogging, weight lifting, or aerobic exercise, until your doctor says it is okay.      Ask your doctor when you can drive again.      Most people are able to return to work the day after the procedure. If your work requires intense activity, you may feel pain in your kidney area or get tired easily. If this happens, you may need to do less strenuous activities while the stent is in.      Ask your doctor when it is okay for you to have sex.   Diet     You can eat your normal diet. If your stomach is upset, try bland, low-fat foods like plain rice, broiled chicken, toast, and yogurt.      Drink plenty of fluids (unless your doctor tells you not to).   Medicines     Your doctor will tell you if and when you can restart your medicines. You will  also get instructions about taking any new medicines.      If you  take aspirin or some other blood thinner, ask your doctor if and when to start taking it again. Make sure that you understand exactly what your doctor wants you to do.     1. Be safe with medicines. Take pain medicines exactly as directed.  ? If the doctor gave you a prescription medicine for pain, take it as prescribed.  ? If you are not taking a prescription pain medicine, ask your doctor if you can take an over-the-counter medicine.     1. If you think your pain medicine is making you sick to your stomach:  ? Take your medicine after meals (unless your doctor has told you not to).  ? Ask your doctor for a different pain medicine.      If your doctor prescribed antibiotics, take them as directed. Do not stop taking them just because you feel better. You need to take the full course of antibiotics.   Follow-up care is a key part of your treatment and safety. Be sure to make and go to all appointments, and call your doctor if you are having problems. It's also a good idea to know your test results and keep a list of the medicines you take.  When should you call for help?   Call 911 anytime you think you may need emergency care. For example, call if:     You passed out (lost consciousness).      You have severe trouble breathing.      You have sudden chest pain and shortness of breath, or you cough up blood.      You have severe belly pain.   Call your doctor now or seek immediate medical care if:     Part or all of the stent comes out of your urethra.      You have pain that does not get better after you take pain medicine.     1. You have symptoms of a urinary infection. For example:  ? You have blood or pus in your urine.  ? You have pain in your back just below your rib cage. This is called flank pain.  ? You have a fever, chills, or body aches.  ? It hurts to urinate.  ? You have groin or belly pain.      You cannot control when you  urinate, or you leak urine.   Watch closely for changes in your health, and be sure to contact your doctor if you have any problems.  Where can you learn more?  Go to [email or web address] and sign in to your MyChart account. Enter [phone] in the Search Health Information box to learn more about "Ureteral Stent Placement: What to Expect at Home."     If you do not have an account, please click on the "Sign Up Now" link.  Current as of: October 22, 2019               Content Version: 12.9   2006-2021 [employer].   Care instructions adapted under license by [facility]. If you have questions about a medical condition or this instruction, always ask your healthcare professional. [employer] disclaims any warranty or liability for your use of this information.

## 2020-11-17 NOTE — Progress Notes (Signed)
Patient not following commands at this time. Patient too drowsy and confused to take any oral medications at this time. Patient's speech very garbled and difficult to understand. Surgical resident Dr. Idelle Jo aware. Will continue to monitor.

## 2020-11-17 NOTE — Progress Notes (Signed)
Notified Dr. Priscella Mann that patient's family have talked and they are ready to speak to him again. Dr. Priscella Mann will call family shortly.

## 2020-11-17 NOTE — Care Coordination-Inpatient (Signed)
11/17/2020 - Received message late yesterday from Woody at [facility] that they have auth for the pt. Also received a message from pt nurse that family was requesting a referral to [facility]. Called maureen at [facility] and left a voicemail. Pt daughter Harriett Sine and her husband had a conversation with Dr Clayburn Pert regarding pt condition and prognosis and pt daughter is going to speak with her brother about proceeding with more surgery for persistent subdural hematoma.Will wait to hear family's decision. SW/CM will follow.

## 2020-11-17 NOTE — Progress Notes (Signed)
Perfect serve message sent to surgical resident, Dr. Idelle Jo, regarding patient's positive UA.

## 2020-11-17 NOTE — Progress Notes (Addendum)
[facility]  PROGRESS NOTE  ATTENDING NOTE        TRAUMA  MECHANISM:  fall    Chief Complaint   Patient presents with   ??? Head Injury     transfer from [place], large SDH  change in mental status       Head Injury   Pertinent negatives include no headaches, vomiting or weakness.     The patient is a 87 yo male who sustained a fall  at approximately earlier today at [facility].patient is confused and is a poor historian. History obtained from chart review. Per report, pt was at PT this AM when he had right sided weakness and confusion. He fell and hit his head.    Pain does not radiate. There are no alleviating or worsening factors regarding the pain.  Pt had a fall back in January and had multiple rib fx and has been declining sincce then  ??    Patient Active Problem List   Diagnosis   ??? Elevated serum cholesterol   ??? Hyperlipidemia   ??? Acute MI (HCC)   ??? Sepsis (HCC)   ??? Arthritis of knee, right   ??? Anemia   ??? Acute on chronic intracranial subdural hematoma (HCC)   ??? Single subsegmental pulmonary embolism without acute cor pulmonale (HCC)   ??? Delirium   ??? Slurred speech   ??? Hydronephrosis with urinary obstruction due to ureteral calculus   ??? Pleural effusion   ??? Subdural hemorrhage (HCC)   ??? Palliative care by specialist       SUBJECTIVE/OVERNIGHT EVENTS:  Speech is coherent again today, but he is confused, follows commands, HOH    Review of Systems   Constitutional: Positive for activity change. Negative for appetite change and unexpected weight change.   HENT: Negative.    Eyes: Negative.    Respiratory: Negative.  Negative for cough and shortness of breath.    Cardiovascular: Negative.  Negative for chest pain and leg swelling.   Gastrointestinal: Negative.  Negative for abdominal distention, abdominal pain, anal bleeding, blood in stool, constipation, diarrhea, nausea and vomiting.   Endocrine: Negative.    Genitourinary: Negative.    Musculoskeletal: Negative.  Negative for  arthralgias, back pain, gait problem, joint swelling and myalgias.   Skin: Negative.    Allergic/Immunologic: Negative.    Neurological: Negative.  Negative for dizziness, weakness and headaches.   Hematological: Negative.    Psychiatric/Behavioral: Positive for confusion. Negative for decreased concentration and sleep disturbance.       BP (!) 146/67    Pulse 79    Temp 98 ??F (36.7 ??C) (Axillary)    Resp 18    Ht 6\' 2"  (1.88 m)    Wt 220 lb 3.2 oz (99.9 kg)    SpO2 95%    BMI 28.27 kg/m??   Physical Exam  Constitutional:       Appearance: Normal appearance.   HENT:      Head: Normocephalic and atraumatic.      Nose: Nose normal.      Mouth/Throat:      Mouth: Mucous membranes are moist.      Pharynx: Oropharynx is clear.   Eyes:      Extraocular Movements: Extraocular movements intact.      Pupils: Pupils are equal, round, and reactive to light.   Cardiovascular:      Rate and Rhythm: Normal rate and regular rhythm.      Pulses: Normal pulses.  Heart sounds: Normal heart sounds.   Pulmonary:      Effort: Pulmonary effort is normal.      Breath sounds: Normal breath sounds.   Abdominal:      General: There is no distension.      Palpations: Abdomen is soft.      Tenderness: There is no abdominal tenderness.   Musculoskeletal:         General: No tenderness or signs of injury.      Cervical back: Normal range of motion and neck supple.   Skin:     General: Skin is warm and dry.   Neurological:      General: No focal deficit present.      Mental Status: He is alert. He is disoriented.      Comments: Generalize weakness  Follows commands         ASSESSMENT/PLAN:  1.  TBI--s/p burr holes, NSGY following, keppra, monitor Na, SBP  2.  PE--IVCF  3.  Hydronephrosis--s/p cysto and left ureteral stent, continue foley    INCIDENTAL FINDINGS:  As above    AMPAC:  9/24    DVT/GI ppx:  IVCF/PPI    Awaiting family's decision on monitoring or 2nd burr hole vs. craniotomy    Redge Gainer, MD, MSc, FACS  11/17/2020  8:50  AM

## 2020-11-17 NOTE — Progress Notes (Signed)
Palliative Medicine  Progress Note    No immediate PM needs identified at this time.  Continue current plan of care, family to discuss further interventions with neurosurgery and are pursuing rehabilitation options.  Palliative will continue to follow along peripherally.    Hulen Luster APRN-CNP  Palliative Medicine    Note: This report was completed using computerize voiced recognition software.  Every effort has been made to ensure accuracy; however, inadvertent computerized transcription errors may be present.

## 2020-11-18 LAB — COMPREHENSIVE METABOLIC PANEL
ALT: 14 U/L (ref 0–40)
AST: 20 U/L (ref 0–39)
Albumin: 3 g/dL — ABNORMAL LOW (ref 3.5–5.2)
Alkaline Phosphatase: 74 U/L (ref 40–129)
Anion Gap: 19 mmol/L — ABNORMAL HIGH (ref 7–16)
BUN: 18 mg/dL (ref 6–23)
CO2: 17 mmol/L — ABNORMAL LOW (ref 22–29)
Calcium: 8.2 mg/dL — ABNORMAL LOW (ref 8.6–10.2)
Chloride: 105 mmol/L (ref 98–107)
Creatinine: 1 mg/dL (ref 0.7–1.2)
GFR African American: >60
GFR Non-African American: >60 mL/min/{1.73_m2} (ref >=60)
Glucose: 114 mg/dL — ABNORMAL HIGH (ref 74–99)
Potassium: 3.7 mmol/L (ref 3.5–5.0)
Sodium: 141 mmol/L (ref 132–146)
Total Bilirubin: 0.3 mg/dL (ref 0.0–1.2)
Total Protein: 6.2 g/dL — ABNORMAL LOW (ref 6.4–8.3)

## 2020-11-18 LAB — CBC WITH AUTO DIFFERENTIAL
Basophils %: 0.6 % (ref 0.0–2.0)
Basophils Absolute: 0 E9/L (ref 0.00–0.20)
Eosinophils %: 0 % (ref 0.0–6.0)
Eosinophils Absolute: 0 E9/L — ABNORMAL LOW (ref 0.05–0.50)
Hematocrit: 37.3 % (ref 37.0–54.0)
Hemoglobin: 11.9 g/dL — ABNORMAL LOW (ref 12.5–16.5)
Lymphocytes %: 11.3 % — ABNORMAL LOW (ref 20.0–42.0)
Lymphocytes Absolute: 1.87 E9/L (ref 1.50–4.00)
MCH: 27.9 pg (ref 26.0–35.0)
MCHC: 31.9 % — ABNORMAL LOW (ref 32.0–34.5)
MCV: 87.6 fL (ref 80.0–99.9)
MPV: 10.8 fL (ref 7.0–12.0)
Monocytes %: 1.7 % — ABNORMAL LOW (ref 2.0–12.0)
Monocytes Absolute: 0.34 E9/L (ref 0.10–0.95)
Neutrophils %: 87 % — ABNORMAL HIGH (ref 43.0–80.0)
Neutrophils Absolute: 14.79 E9/L — ABNORMAL HIGH (ref 1.80–7.30)
Platelets: 243 E9/L (ref 130–450)
RBC: 4.26 E12/L (ref 3.80–5.80)
RDW: 15.2 fL — ABNORMAL HIGH (ref 11.5–15.0)
WBC: 17 E9/L — ABNORMAL HIGH (ref 4.5–11.5)

## 2020-11-18 LAB — MAGNESIUM: Magnesium: 1.9 mg/dL (ref 1.6–2.6)

## 2020-11-18 LAB — PHOSPHORUS: Phosphorus: 4.1 mg/dL (ref 2.5–4.5)

## 2020-11-18 MED ORDER — FENTANYL CITRATE (PF) 100 MCG/2ML IJ SOLN
100 | INTRAMUSCULAR | Status: AC
Start: 2020-11-18 — End: 2020-11-18

## 2020-11-18 MED ORDER — PROPOFOL 500 MG/50ML IV EMUL
500 | INTRAVENOUS | Status: AC
Start: 2020-11-18 — End: 2020-11-18

## 2020-11-18 MED ORDER — CEFAZOLIN 2000 MG IN 20 ML SWFI IV SYRINGE (PREMIX)
Freq: Three times a day (TID) | Status: AC
Start: 2020-11-18 — End: 2020-11-19
  Administered 2020-11-18 – 2020-11-19 (×3): via INTRAVENOUS

## 2020-11-18 MED ORDER — SODIUM CHLORIDE 0.9 % IV SOLN
0.9 % | INTRAVENOUS | Status: DC
Start: 2020-11-18 — End: 2020-11-23
  Administered 2020-11-18 – 2020-11-20 (×2): via INTRAVENOUS

## 2020-11-18 MED ORDER — HYDRALAZINE HCL 20 MG/ML IJ SOLN
20 MG/ML | INTRAMUSCULAR | Status: DC | PRN
Start: 2020-11-18 — End: 2020-11-22

## 2020-11-18 MED ORDER — ONDANSETRON HCL 4 MG/2ML IJ SOLN
4 MG/2ML | INTRAMUSCULAR | Status: DC | PRN
Start: 2020-11-18 — End: 2020-11-18
  Administered 2020-11-18: 17:00:00 4 via INTRAVENOUS

## 2020-11-18 MED ORDER — ONDANSETRON HCL 4 MG/2ML IJ SOLN
4 MG/2ML | Freq: Four times a day (QID) | INTRAMUSCULAR | Status: DC | PRN
Start: 2020-11-18 — End: 2020-11-18

## 2020-11-18 MED ORDER — DEXAMETHASONE SODIUM PHOSPHATE 10 MG/ML IJ SOLN
10 | INTRAMUSCULAR | Status: AC
Start: 2020-11-18 — End: 2020-11-18

## 2020-11-18 MED ORDER — OXYCODONE HCL 5 MG PO TABS
5 MG | ORAL | Status: DC | PRN
Start: 2020-11-18 — End: 2020-11-19

## 2020-11-18 MED ORDER — ONDANSETRON 4 MG PO TBDP
4 MG | Freq: Three times a day (TID) | ORAL | Status: DC | PRN
Start: 2020-11-18 — End: 2020-11-18

## 2020-11-18 MED ORDER — LABETALOL HCL 5 MG/ML IV SOLN
5 MG/ML | INTRAVENOUS | Status: DC | PRN
Start: 2020-11-18 — End: 2020-11-22

## 2020-11-18 MED ORDER — THROMBIN 5000 UNITS EX SOLR
5000 | CUTANEOUS | Status: AC
Start: 2020-11-18 — End: 2020-11-18

## 2020-11-18 MED ORDER — SODIUM CHLORIDE 0.9 % IV SOLN
0.9 | INTRAVENOUS | Status: DC | PRN
Start: 2020-11-18 — End: 2020-11-18

## 2020-11-18 MED ORDER — VANCOMYCIN HCL 500 MG IV SOLR
500 | INTRAVENOUS | Status: AC
Start: 2020-11-18 — End: 2020-11-18

## 2020-11-18 MED ORDER — ONDANSETRON HCL 4 MG/2ML IJ SOLN
4 | INTRAMUSCULAR | Status: AC
Start: 2020-11-18 — End: 2020-11-18

## 2020-11-18 MED ORDER — CEFAZOLIN SODIUM 1 G IJ SOLR
1 | INTRAMUSCULAR | Status: AC
Start: 2020-11-18 — End: 2020-11-18

## 2020-11-18 MED ORDER — BACITRACIN ZINC 500 UNIT/GM EX OINT
500 | CUTANEOUS | Status: AC
Start: 2020-11-18 — End: 2020-11-18

## 2020-11-18 MED ORDER — SODIUM CHLORIDE 0.9 % IV SOLN
0.9 % | INTRAVENOUS | Status: DC | PRN
Start: 2020-11-18 — End: 2020-11-18
  Administered 2020-11-18: 16:00:00 via INTRAVENOUS

## 2020-11-18 MED ORDER — GLYCOPYRROLATE 1 MG/5ML IV SOSY
1 | INTRAVENOUS | Status: AC
Start: 2020-11-18 — End: 2020-11-18

## 2020-11-18 MED ORDER — LIDOCAINE-EPINEPHRINE 0.5 %-1:200000 IJ SOLN
0.5 | INTRAMUSCULAR | Status: AC
Start: 2020-11-18 — End: 2020-11-18

## 2020-11-18 MED ORDER — BACITRACIN ZINC 500 UNIT/GM EX OINT
500 UNIT/GM | CUTANEOUS | Status: DC | PRN
Start: 2020-11-18 — End: 2020-11-18
  Administered 2020-11-18: 16:00:00 1 via TOPICAL

## 2020-11-18 MED ORDER — ROCURONIUM BROMIDE 50 MG/5ML IV SOLN
505 MG/5ML | INTRAVENOUS | Status: DC | PRN
Start: 2020-11-18 — End: 2020-11-18
  Administered 2020-11-18: 16:00:00 50 via INTRAVENOUS

## 2020-11-18 MED ORDER — VANCOMYCIN HCL 500 MG IV SOLR
500 MG | INTRAVENOUS | Status: DC | PRN
Start: 2020-11-18 — End: 2020-11-18
  Administered 2020-11-18: 18:00:00 500

## 2020-11-18 MED ORDER — GELATIN ABSORBABLE 100 EX MISC
100 | CUTANEOUS | Status: DC | PRN
Start: 2020-11-18 — End: 2020-11-18
  Administered 2020-11-18: 16:00:00 1 via TOPICAL

## 2020-11-18 MED ORDER — MEPERIDINE HCL 25 MG/ML IJ SOLN
25 MG/ML | INTRAMUSCULAR | Status: DC | PRN
Start: 2020-11-18 — End: 2020-11-18

## 2020-11-18 MED ORDER — FENTANYL CITRATE (PF) 100 MCG/2ML IJ SOLN
100 MCG/2ML | INTRAMUSCULAR | Status: DC | PRN
Start: 2020-11-18 — End: 2020-11-18
  Administered 2020-11-18: 16:00:00 100 via INTRAVENOUS

## 2020-11-18 MED ORDER — NORMAL SALINE FLUSH 0.9 % IV SOLN
0.9 | Freq: Two times a day (BID) | INTRAVENOUS | Status: DC
Start: 2020-11-18 — End: 2020-11-18

## 2020-11-18 MED ORDER — PROPOFOL 500 MG/50ML IV EMUL
50050 MG/50ML | INTRAVENOUS | Status: DC | PRN
Start: 2020-11-18 — End: 2020-11-18
  Administered 2020-11-18: 16:00:00 via INTRAVENOUS

## 2020-11-18 MED ORDER — NORMAL SALINE FLUSH 0.9 % IV SOLN
0.9 | INTRAVENOUS | Status: DC | PRN
Start: 2020-11-18 — End: 2020-11-18

## 2020-11-18 MED ORDER — THROMBIN 5000 UNITS EX SOLR
5000 units | CUTANEOUS | Status: DC | PRN
Start: 2020-11-18 — End: 2020-11-18
  Administered 2020-11-18: 16:00:00 10000 via TOPICAL

## 2020-11-18 MED ORDER — GELATIN ABSORBABLE 100 EX MISC
100 | CUTANEOUS | Status: AC
Start: 2020-11-18 — End: 2020-11-18

## 2020-11-18 MED ORDER — CEFAZOLIN 2000 MG IN 20 ML SWFI IV SYRINGE (PREMIX)
Status: AC
Start: 2020-11-18 — End: 2020-11-18
  Administered 2020-11-18: 12:00:00 via INTRAVENOUS

## 2020-11-18 MED ORDER — MORPHINE SULFATE (PF) 4 MG/ML IJ SOLN
4 MG/ML | INTRAMUSCULAR | Status: DC | PRN
Start: 2020-11-18 — End: 2020-11-19

## 2020-11-18 MED ORDER — LIDOCAINE HCL (CARDIAC) PF 100 MG/5ML IV SOLN
1005 MG/5ML | INTRAVENOUS | Status: DC | PRN
Start: 2020-11-18 — End: 2020-11-18
  Administered 2020-11-18: 16:00:00 60 via INTRAVENOUS

## 2020-11-18 MED ORDER — MORPHINE SULFATE (PF) 2 MG/ML IV SOLN
2 | INTRAVENOUS | Status: DC | PRN
Start: 2020-11-18 — End: 2020-11-18

## 2020-11-18 MED ORDER — PROPOFOL 500 MG/50ML IV EMUL
500 | INTRAVENOUS | Status: DC | PRN
Start: 2020-11-18 — End: 2020-11-18
  Administered 2020-11-18: 16:00:00 100 via INTRAVENOUS

## 2020-11-18 MED ORDER — NORMAL SALINE FLUSH 0.9 % IV SOLN
0.9 | Freq: Two times a day (BID) | INTRAVENOUS | Status: DC
Start: 2020-11-18 — End: 2020-11-23
  Administered 2020-11-18 – 2020-11-23 (×8): via INTRAVENOUS

## 2020-11-18 MED ORDER — SURGIFLO WITH EVITHROM HEMOSTATIC MATRIX 6CC-2ML
Status: AC
Start: 2020-11-18 — End: 2020-11-18

## 2020-11-18 MED ORDER — SURGIFLO WITH EVITHROM HEMOSTATIC MATRIX 6CC-2ML
Status: DC | PRN
Start: 2020-11-18 — End: 2020-11-18
  Administered 2020-11-18: 16:00:00 1 via TOPICAL

## 2020-11-18 MED ORDER — SUGAMMADEX SODIUM 500 MG/5ML IV SOLN
500 | INTRAVENOUS | Status: AC
Start: 2020-11-18 — End: 2020-11-18

## 2020-11-18 MED ORDER — CEFAZOLIN SODIUM 1 G IJ SOLR
1 g | INTRAMUSCULAR | Status: DC | PRN
Start: 2020-11-18 — End: 2020-11-18
  Administered 2020-11-18: 16:00:00 2000 via INTRAVENOUS

## 2020-11-18 MED ORDER — PROPOFOL 200 MG/20ML IV EMUL
200 | INTRAVENOUS | Status: AC
Start: 2020-11-18 — End: 2020-11-18

## 2020-11-18 MED ORDER — NORMAL SALINE FLUSH 0.9 % IV SOLN
0.9 % | INTRAVENOUS | Status: DC | PRN
Start: 2020-11-18 — End: 2020-11-23

## 2020-11-18 MED ORDER — NORMAL SALINE FLUSH 0.9 % IV SOLN
0.9 % | Freq: Two times a day (BID) | INTRAVENOUS | Status: DC
Start: 2020-11-18 — End: 2020-11-18

## 2020-11-18 MED ORDER — PHENYLEPHRINE HCL 1 MG/10ML IV SOSY
1 | INTRAVENOUS | Status: AC
Start: 2020-11-18 — End: 2020-11-18

## 2020-11-18 MED ORDER — NEOSTIGMINE METHYLSULFATE 3 MG/3ML IV SOSY
3 | INTRAVENOUS | Status: AC
Start: 2020-11-18 — End: 2020-11-18

## 2020-11-18 MED ORDER — OXYCODONE HCL 10 MG PO TABS
10 MG | ORAL | Status: DC | PRN
Start: 2020-11-18 — End: 2020-11-19

## 2020-11-18 MED ORDER — MORPHINE SULFATE (PF) 2 MG/ML IV SOLN
2 MG/ML | INTRAVENOUS | Status: DC | PRN
Start: 2020-11-18 — End: 2020-11-19

## 2020-11-18 MED ORDER — SUGAMMADEX SODIUM 500 MG/5ML IV SOLN
5005 MG/5ML | INTRAVENOUS | Status: DC | PRN
Start: 2020-11-18 — End: 2020-11-18
  Administered 2020-11-18: 17:00:00 400 via INTRAVENOUS

## 2020-11-18 MED ORDER — PHENYLEPHRINE HCL 1 MG/10ML IV SOSY
1 MG/0ML | INTRAVENOUS | Status: DC | PRN
Start: 2020-11-18 — End: 2020-11-18
  Administered 2020-11-18: 17:00:00 100 via INTRAVENOUS
  Administered 2020-11-18: 17:00:00 50 via INTRAVENOUS
  Administered 2020-11-18 (×2): 100 via INTRAVENOUS

## 2020-11-18 MED ORDER — LEVETIRACETAM IN NACL 500 MG/100ML IV SOLN
500100 MG/100ML | Freq: Two times a day (BID) | INTRAVENOUS | Status: DC
Start: 2020-11-18 — End: 2020-11-23
  Administered 2020-11-18 – 2020-11-23 (×11): via INTRAVENOUS

## 2020-11-18 MED ORDER — LIDOCAINE HCL (CARDIAC) PF 100 MG/5ML IV SOLN
100 | INTRAVENOUS | Status: AC
Start: 2020-11-18 — End: 2020-11-18

## 2020-11-18 MED ORDER — ROCURONIUM BROMIDE 50 MG/5ML IV SOLN
50 | INTRAVENOUS | Status: AC
Start: 2020-11-18 — End: 2020-11-18

## 2020-11-18 MED ORDER — LIDOCAINE-EPINEPHRINE 0.5 %-1:200000 IJ SOLN
0.5 %-1:200000 | INTRAMUSCULAR | Status: DC | PRN
Start: 2020-11-18 — End: 2020-11-18
  Administered 2020-11-18: 16:00:00 40 via INTRADERMAL

## 2020-11-18 MED FILL — PHENYLEPHRINE HCL 1 MG/10ML IV SOSY: 1 MG/0ML | INTRAVENOUS | Qty: 10

## 2020-11-18 MED FILL — PROPOFOL 200 MG/20ML IV EMUL: 200 MG/20ML | INTRAVENOUS | Qty: 20

## 2020-11-18 MED FILL — SURGIFOAM 100 EX MISC: 100 | CUTANEOUS | Qty: 1

## 2020-11-18 MED FILL — DEXAMETHASONE SODIUM PHOSPHATE 10 MG/ML IJ SOLN: 10 mg/mL | INTRAMUSCULAR | Qty: 1

## 2020-11-18 MED FILL — POLYETHYLENE GLYCOL 3350 17 G PO PACK: 17 g | ORAL | Qty: 1

## 2020-11-18 MED FILL — BRIDION 500 MG/5ML IV SOLN: 500 MG/5ML | INTRAVENOUS | Qty: 5

## 2020-11-18 MED FILL — ONDANSETRON HCL 4 MG/2ML IJ SOLN: 4 MG/2ML | INTRAMUSCULAR | Qty: 2

## 2020-11-18 MED FILL — SURGIFLO WITH EVITHROM HEMOSTATIC MATRIX 6CC-2ML: Qty: 1

## 2020-11-18 MED FILL — BD POSIFLUSH 0.9 % IV SOLN: 0.9 % | INTRAVENOUS | Qty: 10

## 2020-11-18 MED FILL — LIDOCAINE-EPINEPHRINE 0.5 %-1:200000 IJ SOLN: 0.5 %-1:200000 | INTRAMUSCULAR | Qty: 50

## 2020-11-18 MED FILL — LEVETIRACETAM IN NACL 500 MG/100ML IV SOLN: 500 MG/100ML | INTRAVENOUS | Qty: 100

## 2020-11-18 MED FILL — FENTANYL CITRATE (PF) 100 MCG/2ML IJ SOLN: 100 MCG/2ML | INTRAMUSCULAR | Qty: 2

## 2020-11-18 MED FILL — CEFAZOLIN SODIUM 1 G IJ SOLR: 1 g | INTRAMUSCULAR | Qty: 1000

## 2020-11-18 MED FILL — GLYCOPYRROLATE 1 MG/5ML IV SOSY: 1 MG/5ML | INTRAVENOUS | Qty: 5

## 2020-11-18 MED FILL — PROPOFOL 500 MG/50ML IV EMUL: 500 MG/50ML | INTRAVENOUS | Qty: 50

## 2020-11-18 MED FILL — HYDRALAZINE HCL 20 MG/ML IJ SOLN: 20 mg/mL | INTRAMUSCULAR | Qty: 1

## 2020-11-18 MED FILL — ROCURONIUM BROMIDE 50 MG/5ML IV SOLN: 50 MG/5ML | INTRAVENOUS | Qty: 5

## 2020-11-18 MED FILL — BACITRACIN ZINC 500 UNIT/GM EX OINT: 500 UNIT/GM | CUTANEOUS | Qty: 28

## 2020-11-18 MED FILL — ATORVASTATIN CALCIUM 20 MG PO TABS: 20 mg | ORAL | Qty: 1

## 2020-11-18 MED FILL — NEOSTIGMINE METHYLSULFATE 3 MG/3ML IV SOSY: 3 mg/mL | INTRAVENOUS | Qty: 3

## 2020-11-18 MED FILL — THROMBIN-JMI 5000 UNITS EX SOLR: 5000 [IU] | CUTANEOUS | Qty: 10000

## 2020-11-18 MED FILL — CEFAZOLIN 2000 MG IN 20 ML SWFI IV SYRINGE (PREMIX): Qty: 2000

## 2020-11-18 MED FILL — LIDOCAINE HCL (CARDIAC) PF 100 MG/5ML IV SOLN: 100 MG/5ML | INTRAVENOUS | Qty: 5

## 2020-11-18 MED FILL — VANCOMYCIN HCL 500 MG IV SOLR: 500 mg | INTRAVENOUS | Qty: 500

## 2020-11-18 MED FILL — HEPARIN SODIUM (PORCINE) 10000 UNIT/ML IJ SOLN: 10000 [IU]/mL | INTRAMUSCULAR | Qty: 1

## 2020-11-18 NOTE — Anesthesia Post-Procedure Evaluation (Signed)
Department of Anesthesiology  Postprocedure Note    Patient: Tyler Rhodes  MRN: [id number]  Birthdate: July 08, 1929  Date of evaluation: 11/18/2020  Time:  2:11 PM     Procedure Summary     Date: 11/18/20 Room / Location: [facility] OR 06 / [facility]    Anesthesia Start: 1038 Anesthesia Stop:     Procedure: LEFT FRONTAL CRANIOTOMY FOR SUBDURAL HEMATOMA (Left Head) Diagnosis: (/)    Surgeons: Rupert Stacks, MD Responsible Provider: Sherron Monday, MD    Anesthesia Type: Not recorded ASA Status: Not recorded          Anesthesia Type: general    Aldrete Phase I: Aldrete Score: 10    Aldrete Phase II:      Last vitals: Reviewed and per EMR flowsheets.       Anesthesia Post Evaluation    Patient location during evaluation: PACU  Patient participation: complete - patient participated  Level of consciousness: awake  Pain score: 3  Airway patency: patent  Nausea & Vomiting: no nausea and no vomiting  Complications: no  Cardiovascular status: blood pressure returned to baseline  Respiratory status: acceptable  Hydration status: euvolemic

## 2020-11-18 NOTE — Progress Notes (Signed)
Speech Language Pathology      NAME:  Tyler Rhodes  DOB:  August 03, 1929  DATE: 11/18/2020  ROOM:  OR POOL /NONE    Chart review indicating that Pt returned to OR for L temporal crani for SDH. Please re-order SLP eval/ treat once back from OR and clear for for further intervention.     Acute on chronic intracranial subdural hematoma (HCC) [I62.01, I62.03]    Waynard Edwards., CCC-SLP  Speech-Language Pathologist  [id number]  11/18/2020

## 2020-11-18 NOTE — Plan of Care (Signed)
Problem: Pain:  Goal: Control of acute pain  Description: Control of acute pain  11/18/2020 0445 by Darlyn Chamber, RN  Outcome: Met This Shift  11/17/2020 1937 by Cruz Condon, RN  Outcome: Met This Shift     Problem: Neurological  Goal: Maximum potential motor/sensory/cognitive function  Outcome: Not Met This Shift     Problem: Verbal Communication - Impaired:  Goal: Functional communication will improve  Description: Functional communication will improve  Outcome: Not Met This Shift  Goal: Ability to interact with others will improve  Description: Ability to interact with others will improve  Outcome: Not Met This Shift

## 2020-11-18 NOTE — Progress Notes (Signed)
Intensive Care Unit  Critical Care Consult  Daily Progress Note 11/18/2020    Date of Admission: 11/09/2020  Admission to ICU: 11/18/2020    CC: Critical Care after L Frontal Craniotomy for [facility]    Hospital Course/ Overnight Events:  87 year old male presented from [facility] with concern for SDH. Working with PT in am patient developed R sided weakness and aphasia.??Reportedly fell at the nursing facility. Hx: ASA and Plavix for CAD. CT scan at [facility] showed Left-sided subacute on chronic subdural hematoma.  3/2 Left fronto/temporal burr hole and subdural drain placement   Urology consulted for Left hydronephrosis, left distal ureteral 7 mm calculi. Vascular consulted for IVF filter placement d/t possible subsegmental PE.   3/3 Cystoscopy and left stent insertion. Vascular placed IVC filter.   3/4 No acute events overnight. Speech worked with patient, NPO/Tube feed. NSGY going to remove drain today d/t minimal output. Spoke with Urology; Rocephin x 7 days.   3/9 Patient remained in hospital, working with PT/OT waiting to go to SNF. Conversation with NSGY about patient condition and prognosis.  Family decided to move forward and have L frontal Craniotomy for SDH.   3/10 Patient to OR with neurosurgery for L frontal craniotomy for SDH today. Transfer to ICU for follow up care. Family at the bedside. Repeat CT head 3/11. Lab work sent.   PHYSICAL EXAM:    BP (!) 98/55    Pulse 92    Temp 97.4 ??F (36.3 ??C)    Resp 18    Ht 6\' 2"  (1.88 m)    Wt 220 lb 3.2 oz (99.9 kg)    SpO2 93%    BMI 28.27 kg/m??     Intake/Output Summary (Last 24 hours) at 11/18/2020 1534  Last data filed at 11/18/2020 1400  Gross per 24 hour   Intake 200 ml   Output 800 ml   Net -600 ml        General appearance:  Comfortable.     Pain Description: denies    GCS:    4 - Opens eyes on own   6 - Follows simple motor commands  4 - Seems confused, disoriented    Pupil size: Left 2 mm  Right 2 mm  Pupil reaction: Yes  Wiggles fingers: Left No Right No  Hand  grasp:  Left decreased    Right decreased  Wiggles toes: Left Yes    Right Yes  Plantar flexion: Left decreased    Right decreased  -Able to hold Left FA off of bed    CONSTITUTIONAL: No acute distress, lying in hospital bed.   NEUROLOGIC: PERRL, Stated " I'm Starving ". Nothing else.   CARDIOVASCULAR: S1 S2, regular rate, regular rhythm, no murmur/gallop/rub. Monitor: SR with occasional PVC  PULMONARY: Bilaterally clear, diminished. No rhonchi/rales/wheezes, no use of accessory muscles.Room Air.   RENAL:Foley to gravity, dark yellow urine  ABDOMEN: soft, nontender, nondistended, nontympanic, normal bowel sounds   MUSCULOSKELETAL: moves all extremities purposefully  R sided weakness > L sided weakness.   SKIN/EXTREMITIES: no rashes/ecchymosis, no edema/clubbing, warm/dry, good capillary refill     LINES:   L Radial A-line  L PIV    CONSULTS:   Neurosurgery    Past Medical History:   Diagnosis Date   ??? Acute MI (HCC)     pt had mi 1997   ??? CAD (coronary artery disease)    ??? Cholesterol serum increased    ??? Hyperlipidemia    ??? Prostate cancer (HCC)    ???  Prostate cancer [facility])        ASSESSMENT/PLAN:       ?? Neuro: L Frontal Craniotomy for SDH. 3/2 L frontal temoporal burr hole [id number] subacute on chronic subdural hematoma. Monitor neuro status, Neurosurgery following, Keppra for seizure prophylaxis.   ?? CV: No acute issues. Hx of Hyperlipidemia, CAD and MI.Monitor hemodynamics. BP goal less than 140. PRN hydralazine & labetalol. Statin.  ?? Pulm: No acute issues.  Monitor RR & SpO2.   ?? GI: Dysphagia. Hx: NGT, modified diet. NPO. Bedside swallow. Monitor bowel function. Speech re- evaluation   ?? Renal: 3/3 Left hydronephrosis, left distal ureteral calculi s/p Cystoscopy and L stent insertion. Urology following. Monitor BUN & Cr. Monitor electrolytes & replace as needed.  Monitor urine output.   ?? ID: No acute issues. Monitor WBC. Ancef x 3 doses.   ?? Endocrine: No acute issues. Monitor BS.   ?? MSK: No acute  issues. ROM. turn & reposition. PT/OT when able   ?? Heme: No acute issues. Hx: Prostate CA. Monitor CBC.        Bowel regimen: Glycolax  Pain control/Sedation: Morphine PRN, Oxycodone and Tylenol  DVT prophylaxis: SCD  GI prophylaxis: Protonix  Foley: Keep in place for critical care monitoring of fluid balance.  Urology following. Ancillary consults:  Neurosurgery and Palliative medicine  Patient/Family update:  Son Richard at the bedside with his wife.Richard stated that Harriett Sine is HC-POA if anything would change with Fayrene Fearing health status.  Questions answered.  Support given. Will update when available.   Code status:  Limited No intubation/reintubation, Yes to CPR,Yes to Shock,Yes to resuscitative medications.     Disposition: ICU    Electronically signed by Carlyle Lipa, APRN - CNP on 11/18/2020 at 3:34 PM

## 2020-11-18 NOTE — Anesthesia Pre-Procedure Evaluation (Signed)
Department of Anesthesiology  Preprocedure Note       Name:  Tyler Rhodes   Age:  87 y.o.  DOB:  [birth year]                                          MRN:  [id number]         Date:  11/18/2020      Surgeon: Moishe Spice):  Rupert Stacks, MD    Procedure: Procedure(s):  LEFT FRONTAL CRANIOTOMY FOR SUBDURAL HEMATOMA    Medications prior to admission:   Prior to Admission medications    Medication Sig Start Date End Date Taking? Authorizing Provider   bisacodyl (DULCOLAX) 10 MG suppository Place 10 mg rectally daily as needed for Constipation    Historical Provider, MD   isosorbide mononitrate (IMDUR) 120 MG extended release tablet Take 120 mg by mouth daily    Historical Provider, MD   magnesium hydroxide (MILK OF MAGNESIA) 400 MG/5ML suspension Take 30 mLs by mouth daily as needed for Constipation    Historical Provider, MD   pantoprazole (PROTONIX) 40 MG tablet Take 40 mg by mouth daily    Historical Provider, MD   acetaminophen (TYLENOL) 325 MG tablet Take 2 tablets by mouth every 4 hours as needed for Pain or Fever 05/11/17   Elsworth Soho Rostom, MD   aspirin 81 MG chewable tablet Take 81 mg by mouth daily     Historical Provider, MD   clopidogrel (PLAVIX) 75 MG tablet Take 75 mg by mouth daily    Historical Provider, MD   simvastatin (ZOCOR) 40 MG tablet Take 40 mg by mouth nightly     Historical Provider, MD       Current medications:    No current facility-administered medications for this visit.     No current outpatient medications on file.     Facility-Administered Medications Ordered in Other Visits   Medication Dose Route Frequency Provider Last Rate Last Admin   ??? sodium chloride flush 0.9 % injection 5-40 mL  5-40 mL IntraVENous 2 times per day Rupert Stacks, MD   10 mL at 11/18/20 0841   ??? sodium chloride flush 0.9 % injection 5-40 mL  5-40 mL IntraVENous PRN Rupert Stacks, MD       ??? 0.9 % sodium chloride infusion  25 mL IntraVENous PRN Rupert Stacks, MD       ??? ceFAZolin (ANCEF) 2000 mg in sterile water 20 mL IV syringe   2,000 mg IntraVENous On Call to OR Rupert Stacks, MD       ??? sodium chloride flush 0.9 % injection 5-40 mL  5-40 mL IntraVENous 2 times per day Sherron Monday, MD       ??? sodium chloride flush 0.9 % injection 5-40 mL  5-40 mL IntraVENous PRN Sherron Monday, MD       ??? 0.9 % sodium chloride infusion  25 mL IntraVENous PRN Sherron Monday, MD       ??? meperidine (DEMEROL) injection 12.5 mg  12.5 mg IntraVENous Q5 Min PRN Sherron Monday, MD       ??? morphine (PF) injection 1 mg  1 mg IntraVENous Q5 Min PRN Sherron Monday, MD       ??? morphine (PF) injection 2 mg  2 mg IntraVENous Q5 Min PRN Sherron Monday, MD       ???  levETIRAcetam (KEPPRA) 500 mg/100 mL IVPB  500 mg IntraVENous Q12H Earline Mayotte, MD   Stopped at 11/18/20 0908   ??? atorvastatin (LIPITOR) tablet 20 mg  20 mg Oral Daily Carlean Purl, APRN - CNP   20 mg at 11/18/20 3785   ??? polyethylene glycol (GLYCOLAX) packet 17 g  17 g Oral Daily Carlean Purl, APRN - CNP   17 g at 11/18/20 0841   ??? acetaminophen (TYLENOL) tablet 650 mg  650 mg Oral Q4H PRN Carlean Purl, APRN - CNP       ??? pantoprazole (PROTONIX) tablet 40 mg  40 mg Oral QAM AC Carlean Purl, APRN - CNP   40 mg at 11/17/20 0534   ??? heparin (porcine) injection 5,000 Units  5,000 Units SubCUTAneous 3 times per day Margaret Pyle, MD   5,000 Units at 11/17/20 1313   ??? bisacodyl (DULCOLAX) suppository 10 mg  10 mg Rectal Daily PRN Marya Amsler, APRN - CNP       ??? hydrALAZINE (APRESOLINE) injection 10 mg  10 mg IntraVENous Q4H PRN Marya Amsler, APRN - CNP   10 mg at 11/18/20 8850   ??? labetalol (NORMODYNE;TRANDATE) injection 10 mg  10 mg IntraVENous Q4H PRN Marya Amsler, APRN - CNP   10 mg at 11/14/20 1415   ??? isosorbide mononitrate (IMDUR) extended release tablet 120 mg  120 mg Oral Daily Bryan Lemma Ricchiuti, MD   120 mg at 11/18/20 0841   ??? ondansetron (ZOFRAN-ODT) disintegrating tablet 4 mg  4 mg Oral Q8H PRN Jacki Cones, MD        Or   ??? ondansetron (ZOFRAN) injection 4 mg  4 mg  IntraVENous Q6H PRN Jacki Cones, MD           Allergies:  No Known Allergies    Problem List:    Patient Active Problem List   Diagnosis Code   ??? Elevated serum cholesterol E78.9   ??? Hyperlipidemia E78.5   ??? Acute MI (HCC) I21.9   ??? Sepsis (HCC) A41.9   ??? 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 Anemia D64.9   ??? Acute on chronic intracranial subdural hematoma (HCC) I62.01, I62.03   ??? Single subsegmental pulmonary embolism without acute cor pulmonale (HCC) I26.93   ??? Delirium R41.0   ??? Slurred speech R47.81   ??? Hydronephrosis with urinary obstruction due to ureteral calculus N13.2   ??? Pleural effusion J90   ??? Subdural hemorrhage (HCC) I62.00   ??? Palliative care by specialist Z51.5       Past Medical History:        Diagnosis Date   ??? Acute MI (HCC)     pt had mi 1997   ??? CAD (coronary artery disease)    ??? Cholesterol serum increased    ??? Hyperlipidemia    ??? Prostate cancer (HCC)    ??? Prostate cancer [facility])        Past Surgical History:        Procedure Laterality Date   ??? BLADDER SURGERY N/A 11/11/2020    CYSTOSCOPY RETROGRADE PYELOGRAM LEFT STENT INSERTION performed by Jacki Cones, MD at [facility] OR   ??? CARDIAC SURGERY     ??? CORONARY ANGIOPLASTY      pt had a triple by pass 1997   ??? CORONARY ANGIOPLASTY      pt had cat 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   ??? CRANIOTOMY Left 11/10/2020    LEFT CRANIOTOMY BURR  HOLESFOR SUBDURAL [facility]ene Ugokwe[facility]   ??? VENA CAVA FILTER PLACEMENT N/A 11/11/2020    INFERIOR VENA CAVA FILTER PLACEMENT [facility] Louie BostonPratheek S Kakkasseril[facility]       Social History:    Social History     Tobacco Use   ??? Smoking status: Former Smoker     Packs/day: 1.00     Years: 10.00     Pack years: 10.00   ??? Smokeless tobacco: Former NeurosurgeonUser     Quit date: 05/30/1996   Substance Use Topics   ??? Alcohol use: No                                Counseling given: Not Answered      Vital Signs (Current):   There were no vitals filed for this visit.                                           BP  Readings from Last 3 Encounters:   11/18/20 (!) 157/96   11/11/20 (!) 120/50   11/10/20 (!) 98/39       NPO Status:  > 12 hours                                                                                BMI:   Wt Readings from Last 3 Encounters:   11/11/20 220 lb 3.2 oz (99.9 kg)   06/19/17 225 lb (102.1 kg)   05/09/17 224 lb 6.4 oz (101.8 kg)     There is no height or weight on file to calculate BMI.    CBC:   Lab Results   Component Value Date    WBC 11.6 11/17/2020    RBC 4.07 11/17/2020    HGB 11.5 11/17/2020    HCT 35.5 11/17/2020    MCV 87.2 11/17/2020    RDW 14.9 11/17/2020    PLT 225 11/17/2020       CMP:   Lab Results   Component Value Date    NA 141 11/17/2020    K 3.6 11/17/2020    K 4.1 11/15/2020    CL 104 11/17/2020    CO2 21 11/17/2020    BUN 17 11/17/2020    CREATININE 0.8 11/17/2020    GFRAA >60 11/17/2020    LABGLOM >60 11/17/2020    GLUCOSE 99 11/17/2020    GLUCOSE 107 01/08/2011    PROT 6.4 11/15/2020    CALCIUM 8.0 11/17/2020    BILITOT 0.5 11/15/2020    ALKPHOS 81 11/15/2020    AST 20 11/15/2020    ALT 12 11/15/2020       POC Tests: No results for input(s): POCGLU, POCNA, POCK, POCCL, POCBUN, POCHEMO, POCHCT in the last 72 hours.    Coags:   Lab Results   Component Value Date    PROTIME 13.3 11/09/2020    PROTIME 13.0 01/06/2011    INR 1.2 11/09/2020    APTT 27.9 11/09/2020  HCG (If Applicable): No results found for: PREGTESTUR, PREGSERUM, HCG, HCGQUANT     ABGs: No results found for: PHART, PO2ART, PCO2ART, HCO3ART, BEART, O2SATART     Type & Screen (If Applicable):  No results found for: LABABO, LABRH    Drug/Infectious Status (If Applicable):  No results found for: HIV, HEPCAB    COVID-19 Screening (If Applicable):   Lab Results   Component Value Date    COVID19 Not Detected 11/09/2020           Anesthesia Evaluation  Patient summary reviewed and Nursing notes reviewed no history of anesthetic complications:   Airway: Mallampati: Unable to assess / NA  TM distance: >3 FB   Neck ROM:  limited  Mouth opening: < 3 FB Dental:      Comment: Unable to assess d/t patient confusion     Pulmonary:                              Cardiovascular:    (+) past MI:, CAD:,       ECG reviewed                        Neuro/Psych:   (+) psychiatric history:             ROS comment: Fall, SDH  confusion  GI/Hepatic/Renal:   (+) GERD:,           Endo/Other:    (+) blood dyscrasia: anticoagulation therapy, arthritis:., .                 Abdominal:             Vascular:          Other Findings:               Anesthesia Plan      general     ASA 3       Induction: intravenous.    MIPS: Postoperative opioids intended, Prophylactic antiemetics administered and Postoperative trial extubation.  Anesthetic plan and risks discussed with patient.      Plan discussed with attending and CRNA.                  Sherron Monday, MD   11/18/2020

## 2020-11-18 NOTE — Progress Notes (Signed)
TRAUMA SURGERY  ATTENDING PROGRESS NOTE      CC: fall    S:   arouses follows simple commands, denies any pain this morning,     O:   @BP  (!) 157/96    Pulse 75    Temp 98 ??F (36.7 ??C) (Oral)    Resp 20    Ht 6\' 2"  (1.88 m)    Wt 220 lb 3.2 oz (99.9 kg)    SpO2 94%    BMI 28.27 kg/m?? @    Gen - no apparent distress   Neuro - Awake, alert, attentive    HEENT - PERRL 38mm b/l L burr hole crani sites CDI,   Lungs - non labored, BS clear b/l    Heart - RR no extra heart sounds    Abdomen - SNT   Ext- ROM WNl NVI all ext     A/P: s/p fall       Principal Problem:    Acute on chronic intracranial subdural hematoma (HCC)  Active Problems:    Single subsegmental pulmonary embolism without acute cor pulmonale (HCC)    Delirium    Slurred speech    Hydronephrosis with urinary obstruction due to ureteral calculus    Pleural effusion    Subdural hemorrhage (HCC)    Palliative care by specialist  Resolved Problems:    * No resolved hospital problems. *      TBI, SDH s/p burr hole now for crani with NS,   Hx PE VCF,   Hydronephrosis, s/p cysto with urology, and stent, check urine cx, not on abx currently,     DVT prophylaxis: SCDs,  See d/c summary     MD FACS

## 2020-11-18 NOTE — Progress Notes (Signed)
Consent for procedure verified and in soft chart.

## 2020-11-18 NOTE — Op Note (Signed)
[facility]                  [address] [place], [place] [address]                                OPERATIVE REPORT    PATIENT NAME: Tyler Rhodes, Tyler Rhodes                       DOB:        10-09-1928  MED REC NO:   [id number]                            ROOM:       3821  ACCOUNT NO:   [id number]                           ADMIT DATE: 11/09/2020  PROVIDER:     Rupert Stacks, MD    DATE OF PROCEDURE:  11/18/2020    PREOPERATIVE DIAGNOSIS:  Left frontotemporal subdural hematoma.    POSTOPERATIVE DIAGNOSIS:  Left frontotemporal subdural hematoma.    OPERATIVE PROCEDURES:  1.  Left frontotemporal craniotomy for evacuation of left frontotemporal  subdural hematoma.  2.  AS modifier for Raleigh Lions, PA-C, who assisted primary exposure  and primary closure.    ANESTHESIA:  Generalized endotracheal anesthesia.    SURGEON:  Rupert Stacks, MD    ASSISTANT:  Raleigh Lions, PA-C    COMPLICATIONS:  None.    ESTIMATED BLOOD LOSS:  100 mL.    SPECIMEN:  None.    OPERATIVE INDICATIONS:  The patient is a 87 year old gentleman who  presented to the hospital after a fall with subdural hematoma.  He  initially had burr hole drainage of the subdural hematoma.  He had  persistent recurrent subdural hematoma with mass effect, and after  risks, benefits, and alternatives were discussed with the patient and  his family,it was determined that he would undergo the above-listed  procedure.  Of note, Raleigh Lions, PA-C's services were required as she  was the only qualified assistant to assist with primary exposure and  primary closure.    OPERATIVE PROCEDURE:  The patient was brought into the operating room.   A timeout was performed where he was identified by his name, medical  record number, and the operative procedure which he was about to  undergo.  Next, induction of generalized endotracheal anesthesia was  then commenced.  Upon completion of induction of generalized  endotracheal anesthesia, he received  preoperative antibiotics.  He was  then positioned on the operating table with a shoulder roll underneath  his left shoulder.  His head was turned towards the right side and a  reverse question mark incision was then marked in the left  frontotemporal region, and after his head was prepped and draped in the  usual sterile fashion, a #10-blade was used to make a skin incision.   Monopolar cautery was used to dissect through the subcutaneous tissue  down to the five layers of the scalp and the pericranium.  I was able to  then reflect the myocutaneous flap anteriorly.  I then subsequently  proceeded to use a high-speed burr with perforated bit to drill two burr  holes, one in the frontal fossa and one in the temporal fossa.  I  then  used a small straight curette to remove the thin layer of pericranium  that was left in place.  I used a Penfield #3 to dissect the dura from  the undersurface of the skull.  I then proceeded to use a craniotome to  elevate an approximately 5 x 5 cm bone flap.  Once the bone flap was  elevated, I then proceeded to place tack-up stitches in the periphery of  the craniotomy site.  I then used a #11 blade to open up the dura and  subsequently used Metzenbaum scissors and _____ opening the dura in a  cruciate fashion.  I reflected the dural leaflets posteriorly and tacked  them down with 4-0 Nurolon suture.  There was a subdural clot on the  surface of the brain with multiple membranes.  I was then able to then  dissect through the initial membrane.  I then subsequently evacuated  subdural hematoma using biopsy forceps.  I was able to then strip the  membrane off the surface of the brain.  After evacuating the subdural  hematoma, I irrigated the subdural space copiously with saline.  I then  proceeded to then reapproximate the dura and I closed the dura with 4-0  Nurolon stitches in interrupted fashion.  I then proceeded to then  replace the bone flap using the Stryker Leibinger cranial  fixation  system, and I closed the wound in layers using 0 Vicryl for the  temporalis muscle and fascia, 2-0 Vicryl for the galea, and staples for  the skin.  A dry sterile dressing was placed over this.  The patient was  then subsequently transported to the surgical intensive care unit in  stable condition.  There were no complications.  Counts were correct.  I  was present for the entire case.        Rupert Stacks, MD    D: 11/18/2020 20:49:47       T: 11/18/2020 20:52:09     [id number]  Job#: [id number]     Doc#: [id number]    CC:

## 2020-11-18 NOTE — Progress Notes (Signed)
Nurse to nurse report called to [facility] in CVICU. All questions answered at this time.

## 2020-11-18 NOTE — Progress Notes (Signed)
Floor telepack/leads remained on patient while in line room

## 2020-11-18 NOTE — Progress Notes (Signed)
Treatment consent for Left frontal craniotomy with Dr. Priscella Mann signed via telephone consent with 2 RN Witness by patient's son, Boysie Bonebrake.

## 2020-11-18 NOTE — Care Coordination-Inpatient (Addendum)
11/18/2020 - Pt scheduled for left frontal craniotomy today. Received message from [place] at [facility] - pt was to discharge to [facility] when medically stable. Per DON at [facility], pt family is paying to hold the bed. They will reach out to the family to see if they want to continue to do so. SW/CM will follow for discharge planning.   Addendum - received message from [place] at [facility] - the bed for pt at [facility] is continued to be held for pt per family request. Thayer Ohm will continue to follow until family says otherwise.

## 2020-11-18 NOTE — Progress Notes (Signed)
Occupational Therapy  Attempted OT tx session at b/s, however, pt is tentatively scheduled to undergo Left frontal craniotomy with Dr. Priscella Mann this date.  Will hold Session until pt is medically appropriate to participate in Therapy post-op.  Thank you for this referral.  Garey Ham, MOT, OTR/L  # [phone]

## 2020-11-18 NOTE — Brief Op Note (Signed)
Brief Postoperative Note      Patient: Tyler Rhodes  Date of Birth: November 13, 1928  MRN: [id number]    Date of Procedure: 11/18/2020    Pre-Op Diagnosis: Subdural hematoma    Post-Op Diagnosis: Same       Procedure(s):  LEFT FRONTAL CRANIOTOMY FOR SUBDURAL HEMATOMA    Surgeon(s):  Rupert Stacks, MD    Assistant:  Physician Assistant: Wenda Low, PA-C    Anesthesia: General    Estimated Blood Loss (mL): less than 100     Complications: None    Specimens:   * No specimens in log *    Implants:  * No implants in log *      Drains:   Urethral Catheter Latex 16 fr (Active)   Catheter Indications Urinary retention (acute or chronic), continuous bladder irrigation or bladder outlet obstruction 11/18/20 0800   Securement Device Date Changed 11/18/20 11/18/20 0620   Site Assessment No urethral drainage 11/18/20 0800   Urine Color Amber 11/18/20 0800   Urine Appearance Cloudy 11/18/20 0800   Output (mL) 550 mL 11/18/20 0547       Ventricular Device Ventricular drainage catheter Left (Active)       [REMOVED] NG/OG/NJ/NE Tube Nasoenteric feeding tube 12 fr Left nostril (Removed)   Surrounding Skin Dry;Intact 11/12/20 0600   Securement device Yes 11/13/20 1400   Status Other (Comment) 11/14/20 0655   Placement Verified by X-Ray (repeat) 11/12/20 0600   Drainage Appearance Other (Comment) 11/12/20 0600   Tube Feeding Standard with Fiber 11/13/20 2239   Tube Feeding Status Continuous 11/13/20 2239   Rate/Schedule 40 mL/hr 11/13/20 2239   Tube Feeding Intake (mL) 104 ml 11/14/20 0655   Free Water Flush (mL) 30 mL 11/14/20 0655   Free Water Rate 30ccQ3hr 11/13/20 2239   Output (mL) 0 ml 11/13/20 1400       [REMOVED] Ventriculostomy (Removed)   Drain Status Open 11/12/20 0600   Dressing Status Clean;Intact 11/12/20 0600   Output (ml) 0 ml 11/12/20 0600       [REMOVED] Urethral Catheter 16 fr (Removed)   $ Urethral catheter insertion $ Not inserted for procedure 11/10/20 2200   Catheter Indications Need for fluid volume management of the  critically ill patient in a critical care setting 11/11/20 1215   Urine Color Yellow 11/11/20 1215   Urine Appearance Cloudy 11/11/20 1215   Output (mL) 10 mL 11/11/20 1000       [REMOVED] External Urinary Catheter (Removed)   Catheter changed  Yes 11/10/20 0630   Urine Color Yellow 11/10/20 0630   Output (mL) 100 mL 11/10/20 0630   Suction 40 mmgHg continuous 11/10/20 1400   Placement Replaced 11/10/20 0630       Findings: see dictated op note    Electronically signed by Halina Andreas, MD on 11/18/2020 at 12:26 PM

## 2020-11-19 ENCOUNTER — Inpatient Hospital Stay: Admit: 2020-11-19 | Payer: MEDICARE | Primary: Internal Medicine

## 2020-11-19 LAB — BLOOD GAS, ARTERIAL
B.E.: -9.5 mmol/L — ABNORMAL LOW (ref <=3.0)
COHb: 0.7 % (ref 0.0–1.5)
Date Analyzed: 20220311
HCO3: 14.2 mmol/L — ABNORMAL LOW (ref 22.0–26.0)
HHb: 5.2 % — ABNORMAL HIGH (ref 0.0–5.0)
Lab: 43752
MetHb: 0.2 % (ref 0.0–1.5)
O2 Sat: 94.8 % (ref 92.0–98.5)
O2Hb: 93.9 % — ABNORMAL LOW (ref 94.0–97.0)
Operator ID: 208602
PCO2: 25.5 mmHg — ABNORMAL LOW (ref 35.0–45.0)
PO2: 74 mmHg — ABNORMAL LOW (ref 75.0–100.0)
Pt Temp: 37 C
Time Analyzed: 1011
pH, Blood Gas: 7.363 (ref 7.350–7.450)
tHb (est): 13.2 g/dL (ref 11.5–16.5)

## 2020-11-19 LAB — CBC WITH AUTO DIFFERENTIAL
Basophils %: 0.4 % (ref 0.0–2.0)
Basophils Absolute: 0.08 E9/L (ref 0.00–0.20)
Eosinophils %: 0 % (ref 0.0–6.0)
Eosinophils Absolute: 0 E9/L — ABNORMAL LOW (ref 0.05–0.50)
Hematocrit: 39.1 % (ref 37.0–54.0)
Hemoglobin: 12.6 g/dL (ref 12.5–16.5)
Immature Granulocytes #: 0.48 E9/L
Immature Granulocytes %: 2.1 % (ref 0.0–5.0)
Lymphocytes %: 9.3 % — ABNORMAL LOW (ref 20.0–42.0)
Lymphocytes Absolute: 2.08 E9/L (ref 1.50–4.00)
MCH: 28.2 pg (ref 26.0–35.0)
MCHC: 32.2 % (ref 32.0–34.5)
MCV: 87.5 fL (ref 80.0–99.9)
MPV: 11.1 fL (ref 7.0–12.0)
Monocytes %: 9.8 % (ref 2.0–12.0)
Monocytes Absolute: 2.2 E9/L — ABNORMAL HIGH (ref 0.10–0.95)
Neutrophils %: 78.4 % (ref 43.0–80.0)
Neutrophils Absolute: 17.51 E9/L — ABNORMAL HIGH (ref 1.80–7.30)
Platelets: 268 E9/L (ref 130–450)
RBC: 4.47 E12/L (ref 3.80–5.80)
RDW: 15.6 fL — ABNORMAL HIGH (ref 11.5–15.0)
WBC: 22.4 E9/L — ABNORMAL HIGH (ref 4.5–11.5)

## 2020-11-19 LAB — CREATININE, RANDOM URINE: Creatinine, Ur: 207 mg/dL (ref 40–278)

## 2020-11-19 LAB — COMPREHENSIVE METABOLIC PANEL
ALT: 14 U/L (ref 0–40)
AST: 19 U/L (ref 0–39)
Albumin: 3.1 g/dL — ABNORMAL LOW (ref 3.5–5.2)
Alkaline Phosphatase: 82 U/L (ref 40–129)
Anion Gap: 20 mmol/L — ABNORMAL HIGH (ref 7–16)
BUN: 22 mg/dL (ref 6–23)
CO2: 15 mmol/L — ABNORMAL LOW (ref 22–29)
Calcium: 8.2 mg/dL — ABNORMAL LOW (ref 8.6–10.2)
Chloride: 103 mmol/L (ref 98–107)
Creatinine: 1.1 mg/dL (ref 0.7–1.2)
GFR African American: >60
GFR Non-African American: >60 mL/min/{1.73_m2} (ref >=60)
Glucose: 108 mg/dL — ABNORMAL HIGH (ref 74–99)
Potassium: 3.8 mmol/L (ref 3.5–5.0)
Sodium: 138 mmol/L (ref 132–146)
Total Bilirubin: 0.3 mg/dL (ref 0.0–1.2)
Total Protein: 6.7 g/dL (ref 6.4–8.3)

## 2020-11-19 LAB — SODIUM, URINE, RANDOM: Sodium, Ur: 32 mmol/L

## 2020-11-19 LAB — CALCIUM, IONIZED
Calcium, Ionized: 1.2 mmol/L (ref 1.15–1.33)
Calcium, Ionized: 1.2 mmol/L (ref 1.15–1.33)

## 2020-11-19 LAB — PHOSPHORUS: Phosphorus: 4.1 mg/dL (ref 2.5–4.5)

## 2020-11-19 LAB — MAGNESIUM: Magnesium: 2.1 mg/dL (ref 1.6–2.6)

## 2020-11-19 LAB — LACTIC ACID: Lactic Acid: 1.3 mmol/L (ref 0.5–2.2)

## 2020-11-19 MED ORDER — SODIUM CHLORIDE 0.9 % IV BOLUS
0.9 % | Freq: Once | INTRAVENOUS | Status: AC
Start: 2020-11-19 — End: 2020-11-19
  Administered 2020-11-19: 07:00:00 via INTRAVENOUS

## 2020-11-19 MED ORDER — SODIUM CHLORIDE 0.9 % IV BOLUS
0.9 % | Freq: Once | INTRAVENOUS | Status: AC
Start: 2020-11-19 — End: 2020-11-19
  Administered 2020-11-19: 10:00:00 via INTRAVENOUS

## 2020-11-19 MED FILL — MAPAP 325 MG PO TABS: 325 mg | ORAL | Qty: 2

## 2020-11-19 MED FILL — CEFAZOLIN 2000 MG IN 20 ML SWFI IV SYRINGE (PREMIX): Qty: 2000

## 2020-11-19 MED FILL — LEVETIRACETAM IN NACL 500 MG/100ML IV SOLN: 500 MG/100ML | INTRAVENOUS | Qty: 100

## 2020-11-19 NOTE — Care Coordination-Inpatient (Signed)
Pod #1 s/p L frontal crani for evac of sdh. Had period of decreased mental status this am. Plan Nrb o2 ,on 2hrs off 2hrs, for increased pneumocephalus on ct this am. Plan for insertion of ng for tf's. Ct abd done d/t low uop today. Per Urology,No GU intervention indicated Stent in good position, no hydronephrosis.Bladder is decompressed. Dc plan is to return to [facility] when stable. Family is paying to hold bed. Will need ins precert and Covid test on day of discharge.

## 2020-11-19 NOTE — Progress Notes (Signed)
Palliative Care Department  Palliative Care Progress Note  Provider: Hulen Luster, APRN - CNP  [phone]    Hospital Day: 11  Date of Initial Consult: 11/09/20  Referring Provider: Dr. Orma Render  Palliative Medicine was consulted for assistance with: Geriatric Trauma    Chief Complaint: Tyler Rhodes is a 87 y.o. male with chief complaint of fall, ICH    HPI:   Tyler Rhodes is a 87 y.o. male with significant medical history of CAD, who was admitted on 11/09/2020 after he reportedly developed right-sided weakness and aphasia, followed by a fall during which he reportedly hit his head.  He was originally seen at [facility], upper was transferred to [facility] after he was found to have a subacute on chronic subdural hematoma.  Seen by neurosurgery underwent bur hole craniotomy with subdural drain placement.  Incidentally was found to have possible subsegmental PE as well as left hydronephrosis with a left distal ureteral calculi.  He is being followed by vascular and urology and is planned to undergo IVC filter placement and cystoscopy.  Palliative service has been consulted as per the geriatric trauma protocol.    ASSESSMENT/PLAN:     Pertinent Hospital Diagnoses:  Current medical issues leading to Palliative Medicine involvement include   Active Hospital Problems    Diagnosis Date Noted   ??? Subdural hemorrhage (HCC) [I62.00]    ??? Palliative care by specialist [Z51.5]    ??? Acute on chronic intracranial subdural hematoma (HCC) [I62.01, I62.03] 11/09/2020   ??? Single subsegmental pulmonary embolism without acute cor pulmonale (HCC) [I26.93] 11/09/2020   ??? Delirium [R41.0] 11/09/2020   ??? Slurred speech [R47.81] 11/09/2020   ??? Hydronephrosis with urinary obstruction due to ureteral calculus [N13.2] 11/09/2020   ??? Pleural effusion [J90] 11/09/2020     Palliative Care Encounter / Counseling Regarding Goals of Care:  Please see detailed goals of care discussion as below.  ??? At this time, Tyler Rhodes, Does Not have capacity for medical decision-making.  Capacity is time limited and situation/question specific.  ??? During encounter patient's daughter Harriett Sine was surrogate medical decision-maker  ??? Outcome of goals of care meeting: continue current management   o   ??? Code status: Limited DNR-CCA/Do Not Intubate  ??? Advanced Directives: Living Will and HC-POA  ??? Surrogate/Legal NOK:   Harriett Sine ([phone]) is a patient daughter  ??? Will follow and continued to discuss goals of care pending clinical progression.    Referrals: none today    SUBJECTIVE:   Events/Discussions:  Patient seen the bedside, no family present.  He is now back and the surgical intensive care following his second craniotomy.  Does not appear to be in any acute distress.  I spoke with the patient's daughter by telephone, provided support, discussed goals of care.  She states she understands the odds that he will have a great outcome may be limited, however she feels he would want for [place] to continue to provide him the care he needs for an opportunity to improve.  She does agree to change CODE STATUS to DNR CCA, and she would like for him not to be intubated if he were to develop respiratory failure.  Otherwise she would like to continue with his current plan of care, and reevaluate his overall condition in approximately 1 week prior to making any further decisions regarding his care.    Past Medical History:   Diagnosis Date   ??? Acute MI (HCC)  pt had mi 1997   ??? CAD (coronary artery disease)    ??? Cholesterol serum increased    ??? Hyperlipidemia    ??? Prostate cancer (HCC)    ??? Prostate cancer [facility])        Past Surgical History:   Procedure Laterality Date   ??? BLADDER SURGERY N/A 11/11/2020    CYSTOSCOPY RETROGRADE PYELOGRAM LEFT STENT INSERTION performed by Jacki Cones, MD at [facility] OR   ??? CARDIAC SURGERY     ??? CORONARY ANGIOPLASTY      pt had a triple by pass 1997   ??? CORONARY ANGIOPLASTY      pt had cat 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   ???  CRANIOTOMY Left 11/10/2020    LEFT CRANIOTOMY BURR HOLESFOR SUBDURAL performed by Rupert Stacks, MD at [facility] OR   ??? CRANIOTOMY Left 11/18/2020    LEFT FRONTAL CRANIOTOMY FOR SUBDURAL HEMATOMA performed by Rupert Stacks, MD at [facility] OR   ??? VENA CAVA FILTER PLACEMENT N/A 11/11/2020    INFERIOR VENA CAVA FILTER PLACEMENT performed by Louie Boston, MD at [facility] OR       Family History   Problem Relation Age of Onset   ??? Stroke Mother    ??? Heart Disease Father        No Known Allergies    ROS: UNLESS STATED ABOVE PATIENT DENIES:  CONSTITUTIONAL:  fever, chill, rigors, nausea, vomiting, fatigue.  HEENT: blurry vision, double vision, hearing problem, tinnitus, hoarseness, dysphagia, odynophagia  RESPIRATORY: cough, shortness of breath, sputum expectoration.  CARDIOVASCULAR:  Chest pain/pressure, palpitation, syncope, irregular beats  GASTROINTESTINAL:  abdominal or rectal pain, diarrhea, constipation, .  GENITOURINARY:  Burning, frequency, urgency, incontinence, discharge  INTEGUMENTARY: rash, wound, pruritis  HEMATOLOGIC/LYMPHATIC:  Swelling, sores, gum bleeding, easy bruising, pica.  MUSCULOSKELETAL:  pain, edema, joint swelling or redness  NEUROLOGICAL:  light headed, dizziness, loss of consciousness, weakness, change in memory, seizures, tremors    OBJECTIVE:   Prognosis: unknown    Physical Exam:  BP 124/80    Pulse 93    Temp 97.1 ??F (36.2 ??C) (Temporal)    Resp 25    Ht 6\' 2"  (1.88 m)    Wt 220 lb 3.2 oz (99.9 kg)    SpO2 95%    BMI 28.27 kg/m??     Gen: Elderly, restless, very HOH  HEENT:  Normocephalic, conjunctiva pink, no drainage, mucosa moist, craniotomy sites with dressings intact  Neck:  Supple  Lungs:  CTA bilaterally  Heart: RRR  Abd:  Soft, non tender, non distended, BS+  M/S/Ext:  Moving all extremities, no edema, pulses present  Skin:  Warm and dry  Neuro:  PERRL, Alert, restless    Objective data reviewed: labs, images, records, medication use, vitals and chart    Time/Communication:  Greater than 50% of  time spent, total 25 minutes in counseling and coordination of care at the bedside regarding goals of care and see above.    , APRN - CNP  Palliative Medicine    Patient and the plan of care discussed with the other IDT members of Palliative Care Team, and with consultants, Primary Attending, patient, family and floor nurse, as appropriate and available.    Thank you for allowing Palliative Medicine to participate in the care of Tyler Rhodes.    Note: This report was completed using computerize voiced recognition software.  Every effort has been made to ensure accuracy; however, inadvertent computerized transcription errors may be  present.

## 2020-11-19 NOTE — Progress Notes (Signed)
12 fr. Small bowel feeding tube placed left nares using ENvue tracking system.. tube placed 105 cm 35 cm exposed.  Bridal placed with ease.. KUB ordered. Pt. Tolerated well.

## 2020-11-19 NOTE — Progress Notes (Signed)
SPEECH/LANGUAGE PATHOLOGY  SPEECH/LANGUAGE/COGNITIVE EVALUATION   and PLAN OF CARE      PATIENT NAME:  Tyler Rhodes  (male)     MRN:  [id number]    DOB:  Dec 26, 1928  (87 y.o.)  STATUS:  Inpatient: Room 3821/3821-A    TODAY'S DATE:  11/19/2020  11/18/20 1600   SLP eval and treat Start: 11/18/20 1600, End: 11/18/20 1600, ONE TIME, Standing Count: 1 Occurrences, R    Vangie Bicker, APRN - C      REASON FOR REFERRAL:  Assessment of cognitive linguistic function  EVALUATING THERAPIST: Criss Alvine    ADMITTING DIAGNOSIS: Acute on chronic intracranial subdural hematoma (HCC) [I62.01, I62.03]    VISIT DIAGNOSIS:      SPEECH THERAPY  PLAN OF CARE   The speech therapy  POC is established based on physician order, speech pathology diagnosis and results of clinical assessment     SPEECH PATHOLOGY DIAGNOSIS:  Moderate -severe cognitive deficits, Moderate expressive/receptive aphasia.    Speech Pathology intervention is recommended up to 6 times per week for LOS or when goals are met with emphasis on the following:      Conditions Requiring Skilled Therapeutic Intervention for speech, language and/or cognition    Cognitive linguistic impairment  Expressive language training  Receptive language training    Specific Speech Therapy Interventions to Include:   Receptive language training   Expressive language training   Therapeutic tasks for Cognition    Specific instructions for next treatment:     To initiate POC    SHORT/LONG TERM GOALS  Pt will improve immediate, short term, recent memory during structured and unstructured tasks with 80% accuracy   Pt will improve problem solving/thought organization during structured and unstructured tasks with 80% accuracy   Pt will improve receptive language skills at the conversational speech level with 80% accuracy and decreased latency  Pt will improve word finding and verbal fluency with phrase/sentence level, wh-questions and open ended conversation through incorporation of preparatory  and circumlocution strategies 80% accuracy    Patient goals: Patient/family involved in developing goals and treatment plan:   Treatment goals discussed with Patient    The Patient did not demonstrate complete understanding of the diagnosis, prognosis and plan of care   The patient/family Could not state,     This plan may be re-evaluated and revised as warranted.        Rehabilitation Potential/Prognosis: good                CLINICAL ASSESSMENT:  MOTOR SPEECH       Oral Peripheral Examination   Adequate lingual/labial strength     Parameters of Speech Production  Respiration:  Adequate for speech production  Articulation:  Within functional limits  Resonance:  Within functional limits  Quality:   Within functional limits  Pitch:    Low  Intensity: Loud  Fluency:  Intact  Prosody Monotone    RECEPTIVE LANGUAGE    Comprehension of Yes/No Questions:   Impaired    Process  Simple Verbal Commands:   Impaired  Process Intermediate Verbal Commands:   Impaired  Process Complex Verbal Commands:     Could not test    Comprehension of Conversation:      Latent, Perseveration and Cueing      EXPRESSIVE LANGUAGE     Serials: To be assessed    Imitation:  Words   To be assessed   Sentences To be assessed    Naming:  (Modality  used:  Verbal)  Confrontation Naming  Impaired  Divergent Naming  To be assessed  Response Naming: To be assessed    Conversation:      Confusion was noted during conversation    COGNITION     Attention/Orientation  Attention: Easily Distracted  Orientation:  Oriented to Person, Place    Memory   Immediate Recall: NA    Delayed Recall:   NA    Long Term Recall:   Recalled Age    Organization/Problem Solving/Reasoning   Verbal Sequencing:   To be assessed        Verbal Problem solving:   To be assessed          CLINICAL OBSERVATIONS NOTED DURING THE EVALUATION  Inconsistent responses, Perseveration errors, Cueing was required and Flat affect                  EDUCATION:   The Psychologist, clinical (SLP)  completed education regarding results of evaluation and that intervention is warranted at this time.  Learner: Patient  Education: Reviewed results and recommendations of this evaluation  Evaluation of Education:  No evidence of learning    Evaluation Time includes thorough review of current medical information, gathering information on past medical history/social history and prior level of function, completion of standardized testing/informal observation of tasks, assessment of data and education on plan of care and goals.      CPT code:    92523  eval speech sound lang comprehension      The admitting diagnosis and active problem list, as listed below have been reviewed prior to initiation of this evaluation.        ACTIVE PROBLEM LIST:   Patient Active Problem List   Diagnosis   ??? Elevated serum cholesterol   ??? Hyperlipidemia   ??? Acute MI ([place])   ??? Sepsis ([place])   ??? Arthritis of knee, right   ??? Anemia   ??? Acute on chronic intracranial subdural hematoma (HCC)   ??? Single subsegmental pulmonary embolism without acute cor pulmonale ([place])   ??? Delirium   ??? Slurred speech   ??? Hydronephrosis with urinary obstruction due to ureteral calculus   ??? Pleural effusion   ??? Subdural hemorrhage ([place])   ??? Palliative care by specialist       Criss Alvine B.S.  Speech Language Pathology Graduate Student

## 2020-11-19 NOTE — Progress Notes (Addendum)
Comprehensive Nutrition Assessment    Type and Reason for Visit:  Reassess,Consult (TF Rec's)    Nutrition Recommendations/Plan: Continue NPO.  Recommend to Modify Current EN (2CAL HN/2.0 Calroie not appropriate at this time d/t nasoenteric TF w/ increased risk for dumping syndrome/intolerance).     TF Recommendations:  Standard w/o Fiber (Osmolite1.2) @ 35ml/hr Continuous x 24hrs/d plus 1 Protein Modular BID via TF= TV, 1728kcal, 80gm Pro, free water. (1936kcal, 132gm Pro w/ Pro Mod BID).  Flush per critical care mgmt; if needed, flush q 4hr= total water (TF+Flush).    Nutrition Assessment:  Pt remains at risk d/t adm w/ AMS s/p fall, +SDH, s/p burr hole crani 3/2. Noted pleural effusion & subsegmental pulmonary embolism s/p IVCF 3/3. Noted UTI w/ Hydronephrosis s/p cysto stent placement 3/3.  Noted frontal SDH, s/p frontal crani 3/10.  Corpak previously placed for EN support 2/2 failed swallow eval; repeat BSE 3/7 and 3/11 w/ rec's for NPO; s/p NE/CorPak 3/11.  At risk d/t minimal intake/NPO x past ~4-5d.  Will provide updated TF rec & monitor.    Malnutrition Assessment:  Malnutrition Status:  At risk for malnutrition (Comment)    Context:  Acute Illness     Findings of the 6 clinical characteristics of malnutrition:  Energy Intake:  7 - 50% or less of estimated energy requirements for 5 or more days  Weight Loss:  Unable to assess (no hx on file)     Body Fat Loss:  No significant body fat loss     Muscle Mass Loss:  No significant muscle mass loss    Fluid Accumulation:  No significant fluid accumulation    Grip Strength:  Not Performed    Estimated Daily Nutrient Needs:  Energy (kcal):  19-2100; Weight Used for Energy Requirements:  Admission     Protein (g):  1.3-1.5gm/kg IBW= 110-130; Weight Used for Protein Requirements:  Ideal        Fluid (ml/day):  19-2100; Method Used for Fluid Requirements:  1 ml/kcal      Nutrition Related Findings:  AMSx2, edentulous, Abd/BS WDL, Trace  edema, +I/O's      Wounds:  Multiple,Surgical Incision       Current Nutrition Therapies:    Diet NPO  ADULT TUBE FEEDING; Nasoenteric; 2.0 Calorie; Continuous; 20; Yes; 10; Q 4 hours; 40; 30; Q 4 hours    Anthropometric Measures:  ?? Height: 6\' 2"  (188 cm)  ?? Current Body Weight: 220 lb (99.8 kg) (actual 3/3)   ?? Admission Body Weight: 212 lb (96.2 kg) (3/1 first measured)    ?? Usual Body Weight:  (UTO no EMR hx on file)     ?? Ideal Body Weight: 190 lbs; % Ideal Body Weight 111.6 %   ?? BMI: 28.2  ?? Adjusted Body Weight:  ;     ?? Adjusted BMI:      ?? BMI Categories: Overweight (BMI 25.0-29.9)       Nutrition Diagnosis:   ?? Inadequate oral intake related to cognitive or neurological impairment as evidenced by NPO or clear liquid status due to medical condition,nutrition support - enteral nutrition      Nutrition Interventions:   Food and/or Nutrient Delivery:  Continue NPO,Start Tube Feeding (Continue NPO.  Start EN once tube placed and med appropriate.  TF Rec:  Standard w/o Fiber (Osmolite1.2) @ 61ml/hr Continuous x 24hrs/d plus 1 Protein Modular BID= 72m TV, 1728kcal, 80gm Pro, free water. (1936kcal, 132gm Pro w/ Pro  Mod BID).)  Nutrition Education/Counseling:  No recommendation at this time   Coordination of Nutrition Care:  Continue to monitor while inpatient    Goals:  Nutrition Progression; EN tolerance at goal.       Nutrition Monitoring and Evaluation:   Behavioral-Environmental Outcomes:  None Identified   Food/Nutrient Intake Outcomes:  Diet Advancement/Tolerance,Enteral Nutrition Intake/Tolerance  Physical Signs/Symptoms Outcomes:  Biochemical Data,GI Status,Fluid Status or Edema,Nutrition Focused Physical Findings,Skin,Weight,Chewing or Swallowing     Discharge Planning:    Too soon to determine     Electronically signed by Karen Kitchens, RD, LD on 11/19/20 at 11:56 AM EST    Contact: ext [phone]

## 2020-11-19 NOTE — Progress Notes (Signed)
Perfect served Dr. Janee Morn regarding Low Urine  Out put for the last 3 hours.

## 2020-11-19 NOTE — Progress Notes (Signed)
Physical Therapy  Physical Therapy Reeval    Name: Tyler Rhodes  DOB: [birth year]  MRN: [id number]      Date of Service: 11/19/2020    ReEvaluating PT:  Jake Seats, PT, DPT  [phone]     Room #:  3821/3821-A  Diagnosis:  Acute on chronic intracranial subdural hematoma (HCC) [I62.01, I62.03]  PMHx/PSHx:  CAD, HLD, Prostate cancer, MI  Procedure/Surgery:    (11/10/20) Left frontotemporal bur hole drainage of subdural hematoma, Placement of subdural drain  (11/11/20) Inferior vena cava filter placement   (11/11/20)  Cystopanendoscopy, stent insertion on the left  3/10 L frontal craniotomy   Precautions:  Fall risk, , , , cognition, alarm  Equipment Needs:  TBD     Reeval d/t craniotomy on 3/10    SUBJECTIVE:    Pt admitted from skilled nursing facility. Unable to obtain subjective history due to cognition.     OBJECTIVE:   ReEvaluation  Date: 11/18/20 Treatment   Short Term/ Long Term   Goals   AM-PAC 6 Clicks 7/24     Was pt agreeable to Eval/treatment? Yes     Does pt have pain? No signs of pain      Bed Mobility  Rolling: NT  Supine to sit: MaxA x2  Sit to supine: MaxA x2  Scooting: Dep   ModA   Transfers Sit to stand: MaxA x2  Stand to sit: MaxA x2  Stand pivot: NT  ModA   Ambulation   NT, unable  >15 feet with ModA and AAD    Stair negotiation: ascended and descended NT  TBD   ROM BUE:  Defer to OT note  BLE:  WFL     Strength BUE:  Defer to OT note  B knee extension: 4-/5  B Plantarflexion: 4/5  Increase by 1/3 MMT grade   Balance Sitting EOB:  ModA dynamic, MinA static   Dynamic Standing:  ModA x 2 and HHA   Sitting EOB:  SBA  Dynamic Standing:  ModA with AAD     Pt is A & O x 1 to self. Not to year or place  Sensation:  No reports of numbness/tingling to extremities  Edema:  Unremarkable    Therapeutic Exercises:    fx activity     Patient education  Pt educated on benefit of therapeutic exercise, safety during functional mobility.    Patient response to education:   Pt verbalized understanding Pt demonstrated skill Pt  requires further education in this area   Yes Yes Yes     ASSESSMENT:    Comments:  Session cleared by nursing. Collaboration with OT required for safety.  Pt is awake and alert but lethargic and shows delayed processing. He is able to hold simple conversation and follows basic commands with delay. Heavy, near total assist needed for all mobility. His sitting balance is impaired with posterior leaning but this did improve with time. Able to stand with heavy lift assist -- he does not reach full upright erect posture and is unable to side step. Returned to bed. Noted a small dip in pts BP and pt became slightly less responsive, no longer following commands and not holding conversation. Did respond to pain. RN and physician in room to assess pt.     Treatment:  Patient practiced and was instructed in the following treatment:    ??? Bed mobility - verbal cues to facilitate proper positioning and sequencing; physical assistance provided during activity  ??? Static sitting -  performed to promote upright tolerance, postural awareness, and balance maintenance  ??? Functional transfers - verbal cues to facilitate proper positioning and sequencing; physical assistance provided during activity    PLAN:    Patient is making good progress towards established goals.  Will continue with current POC.      Time in  1005  Time out  1030    Total Treatment Time  10 minutes     CPT codes:  []  Gait training 0 minutes  []  Manual therapy 97140 0 minutes  [x]  Therapeutic activities 97530 10 minutes  []  Therapeutic exercises 97110 0 minutes  []  Neuromuscular reeducation 97112 0 minutes    [id number], PT, DPT  [phone]

## 2020-11-19 NOTE — Progress Notes (Addendum)
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/Left hydronephrosis/Bilateral renal cysts/UTI      Subjective:    Low urine output  Foley in place   Urine cloudy  Pt is alert, somewhat slow to respond but able to say age and name  He reports no abdominal or flank pain  He is currently NPO  Afebrile  No family is present    Review of Systems  Constitutional: Appears ill   Respiratory: negative for cough and shortness of breath  Cardiovascular: negative for chest pain  Gastrointestinal: negative for abdominal pain, nausea and vomiting  Dermatologic: negative  Hematologic/lymphatic: negative for hematuria  Musculoskeletal:negative  Neurological: positive for weakness  Endocrine: negative  Psychiatric: negative    Scheduled Meds:  ??? sodium chloride flush  5-40 mL IntraVENous 2 times per day   ??? ceFAZolin  2,000 mg IntraVENous Q8H   ??? levetiracetam  500 mg IntraVENous Q12H   ??? atorvastatin  20 mg Oral Daily   ??? polyethylene glycol  17 g Oral Daily   ??? pantoprazole  40 mg Oral QAM AC   ??? isosorbide mononitrate  120 mg Oral Daily       Objective:  Vitals:    11/19/20 0700   BP:    Pulse: 102   Resp: 21   Temp:    SpO2:          Allergies: Patient has no known allergies.    General Appearance: alert and oriented to person, and time and in no acute distress, Appears frail and chronically ill  Skin: no rash or erythema  Head: dry dressings to scalp  Pulmonary/Chest: normal air movement, no respiratory distress   Abdomen: soft, non-tender, non-distended  Genitalia: Foley catheter in place and draining cloudy yellow urine      Labs:     Recent Labs     11/19/20  0200   NA 138   K 3.8   CL 103   CO2 15*   BUN 22   CREATININE 1.1   GLUCOSE 108*   CALCIUM 8.2*     Results for HORRIS, SPEROS (MRN [id number]) as of 11/19/2020 11:39   Ref. Range 11/17/2020 10:33 11/18/2020 16:00 11/19/2020 02:00   WBC Latest  Ref Range: 4.5 - 11.5 E9/L 11.6 (H) 17.0 (H) 22.4 (H)   RBC Latest Ref Range: 3.80 - 5.80 E12/L 4.07 4.26 4.47   Hemoglobin Quant Latest Ref Range: 12.5 - 16.5 g/dL 50.3 (L) 54.6 (L) 56.8   Hematocrit Latest Ref Range: 37.0 - 54.0 % 35.5 (L) 37.3 39.1   MCV Latest Ref Range: 80.0 - 99.9 fL 87.2 87.6 87.5   MCH Latest Ref Range: 26.0 - 35.0 pg 28.3 27.9 28.2   MCHC Latest Ref Range: 32.0 - 34.5 % 32.4 31.9 (L) 32.2   MPV Latest Ref Range: 7.0 - 12.0 fL 11.3 10.8 11.1   RDW Latest Ref Range: 11.5 - 15.0 fL 14.9 15.2 (H) 15.6 (H)   Platelet Count Latest Ref Range: 130 - 450 E9/L 225 243 268   Neutrophils % Latest Ref Range: 43.0 - 80.0 % 53.9 87.0 (H) 78.4     Lab Results   Component Value Date   ?? PSA 5.73 (H) 12/01/2014         Results for DEMON, VOLANTE (MRN [id number]) as of 11/19/2020 11:39  Ref. Range 11/17/2020 13:00   Color, UA Latest Ref Range: Straw/Yellow  Yellow   Clarity, UA Latest Ref Range: Clear  Clear   Glucose, UA Latest Ref Range: Negative mg/dL Negative   Bilirubin, Urine Latest Ref Range: Negative  SMALL (A)   Ketones, Urine Latest Ref Range: Negative mg/dL 15 (A)   Specific Gravity, UA Latest Ref Range: 1.005 - 1.030  1.025   Blood, Urine Latest Ref Range: Negative  LARGE (A)   pH, UA Latest Ref Range: 5.0 - 9.0  6.0   Protein, UA Latest Ref Range: Negative mg/dL 956 (A)   Urobilinogen, Urine Latest Ref Range: <2.0 E.U./dL 0.2   Nitrite, Urine Latest Ref Range: Negative  Negative   Leukocyte Esterase, Urine Latest Ref Range: Negative  MODERATE (A)   WBC, UA Latest Ref Range: 0 - 5 /HPF 10-20 (A)   RBC, UA Latest Ref Range: 0 - 2 /HPF >20   Bacteria, UA Latest Ref Range: None Seen /HPF FEW (A)     Culture, Urine  Order: [id number]   Status: Final result ??   Visible to patient: No (not released) ??   Next appt: 12/03/2020 at 02:45 PM in Neurosurgery (Paige [place], [place]) ??  Specimen Information: Urine, Cystoscopic   ??     0 Result Notes    Component 11/11/20 1126   Organism Escherichia coli??Abnormal??     Urine Culture, Routine 50,000 CFU/ml    Resulting Agency [facility] - [facility]          Susceptibility     Escherichia coli     BACTERIAL SUSCEPTIBILITY PANEL BY MIC     amoxicillin-clavulanate ^4 mcg/mL Sensitive     ceFAZolin <=^4 mcg/mL Sensitive     cefepime <=^0.12 mcg/mL Sensitive     cefotaxime <=^0.25 mcg/mL Sensitive     cefOXitin ^16 mcg/mL Intermediate     cefTAZidime-avibactam ^0.25 mcg/mL Sensitive     gentamicin <=^1 mcg/mL Sensitive     levofloxacin >=^8 mcg/mL Resistant     meropenem <=^0.25 mcg/mL Sensitive     nitrofurantoin <=^16 mcg/mL Sensitive     piperacillin-tazobactam ^32 mcg/mL Intermediate     trimethoprim-sulfamethoxazole <=^20 mcg/mL Sensitive                   Assessment/Plan:    Left distal ureteral calculi/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  I believe he has had radiation therapy for prostate cancer in the past    -Continue the Foley   -Urine culture on 11/10/2020 is growing E. coli.  Continue antibiotics  -He will require left ureteroscopy and stone and stent removal at a later date in the near future    Pt is having low urine output today,foley was checked for placement by irrigation and bladder scan and it appears properly placed   He is noted to have leukocytosis  There is a left stent in place  D/t challenging anatomy in surgery and change in condition a stat ct abdomen and pelvis has been ordered. (See Dr.Ricchiuti's op note from 11/11/20)  D/W nursing staff.    12 51 Addendum   Discussed with Dr Elvera Lennox Ricchiuti  Reviewed CT scan  No GU intervention indicated  Stent in good position, no hydronephrosis  Bladder is decompressed  Recommend notify critical care/nephrology of low urine output  Elspeth Cho, APRN - CNP   [facility]

## 2020-11-19 NOTE — Progress Notes (Addendum)
Spoke with Dr. Priscella Mann at the bedside.  Change in mentation with patient.  No repeat CT scan at this time. Non-rebreather on 2 hours; off two hours ordered for increased pneumocephalus on CT scan this am.     Urology notified of low urine output; catheter flushed.  Staff RN to get bladder scan at this time. Bolus 500 cc x 2 overnight.     Speech worked with patient today. We spoke at bedside.  IV tem consulted for small bore feeding tube.     Carlyle Lipa APRN-CNP, AGACNP-BC 11/19/2020 10:43 AM

## 2020-11-19 NOTE — Plan of Care (Signed)
Problem: Pain:  Goal: Pain level will decrease  Description: Pain level will decrease  11/19/2020 0022 by Sherril Croon  Outcome: Met This Shift  Goal: Control of acute pain  Description: Control of acute pain  11/19/2020 0022 by Sherril Croon  Outcome: Met This Shift  Goal: Control of chronic pain  Description: Control of chronic pain  11/19/2020 0022 by Sherril Croon  Outcome: Met This Shift

## 2020-11-19 NOTE — Progress Notes (Signed)
Patient continuously pulls off oxygen therapy, pulls at corpak. Multiple attempts to redirect and distract, unable to be verbally redirected at this time despite following commands. Dr. Janee Morn at bedside upon application.

## 2020-11-19 NOTE — Progress Notes (Signed)
OCCUPATIONAL THERAPY RE-EVALUATION    [facility]  [address] [place], [place]       Date:11/19/2020                                                               Patient Name: Tyler Rhodes  MRN: [id number]  DOB: 1929-07-24  Room: 3821/3821-A    Re-evaluating OT: Geanie Berlin OTD, OTR/L, [id number]    Evaluating OT: Charlie Pitter, OTR/L [phone]    Referring Provider: Renee Rival, MD   Specific Provider Orders/Date: OT eval and treat (11/09/20)     OT re-evaluation indicated s/p 3/10 craniotomy.       Diagnosis: Left subdural hematoma  ??            UTI             PE  Reason for admission: s/p fall at nursing facility with concern for SDH; AMS and R sided weakness    Surgery/Procedures:        3/2  LEFT CRANIOTOMY BURR HOLES FOR SUBDURAL  ??3/3  Cystopanendoscopy, stent insertion on the left   3/3   INFERIOR VENA CAVA FILTER PLACEMENT      3/10 Left frontotemporal craniotomy for evacuation of left frontotemporal  subdural hematoma.    Pertinent Medical History: Acute MI, CAD, HLD, HTN, Prostate Ca     *Precautions:  Fall Risk, bed alarm, , , R UE weakness   , HOH, NPO SBP <140, seizure precautions, elbow protector    Assessment of current deficits   [x]  Functional mobility  [x] ADLs  [x]  Strength               [x] Cognition   [x]  Functional transfers   [x]  IADLs         [x]  Safety Awareness   [x] Endurance   [x]  Fine Coordination        [x]  ROM     [x]  Vision/perception   [] Sensation    [x] Gross Motor Coordination [x]  Balance   []  Delirium                  [] Motor Control     [x]  Communication    OT PLAN OF CARE   OT POC based on physician orders, patient diagnosis and results of clinical assessment.       Frequency/Duration: 1-3 days/wk for 1-2 weeks PRN    Specific OT Treatment to include:   ADL retraining/adapted techniques and AE recommendations to increase functional independence within precautions                    Energy conservation techniques to  improve tolerance for selfcare routine   Functional transfer/mobility training/DME recommendations for increased independence, safety and fall prevention         Patient/family education to increase safety and functional independence within precautions              Environmental modifications for safe mobility and completion of ADLs                           Cognitive retraining ex's to improve problem solving skills & safe participation in ADLs/IADLs  Sensory re-education techniques to improve  extremity awareness, maintain skin integrity and improve hand function                             Visual/Perceptual retraining  to improve body awareness and safety during transfers and ADLs  Splinting/positioning needs to maintain joint/skin integrity and prevent contractures  Therapeutic activity to improve functional performance during ADLs/IADLs                                         Therapeutic exercise to improve tolerance and functional strength for ADLs   Balance retraining exercises/tasks for facilitation of postural control with dynamic challenges during ADLs .  Positioning to improve functional independence  Neuromuscular re-education: facilitation of righting/equilibrium reactions, normalization muscle tone/facilitation active functional movement                      Delirium prevention/treatment    Modified Rankin Scale   Score     Description  0             No symptoms  1             No significant disability despite symptoms  2             Slight disability; able to look after own affairs  3             Moderate disability; able to ambulate without assist/ requires assist with ADLs  4             Moderate/Severe disability;requires assist to ambulate/assist with ADLs  5             Severe disability;bedridden/incontinent   6             Deceased  Score:   5    Recommended Adaptive Equipment: TBA: AD, ADL AE pending progress     Home Living: Pt admitted from [facility] nursing facility. Pt is a poor historian and not  able to report PLOF due to cognitive deficits.   Prior Level of Function: Pt not able to report.   Per old chart:    Occupation: retired, Marine scientist at [employer]: traveling and cruises     Pain Level: pt c/o 0/10  pain  this session; no c/o pain during session    Cognition: A&O: 1/4  (self only)  Follows gross 1 step commands with max verbal/tactile cues.   Memory: poor   Comprehension fair- limited ability to follow conversation. Requires simples instruction.   Problem solving: poor   Judgement/safety: poor               Communication skills: simple responses; pt not able to express basic needs           Vision: Patient incorrectly reporting visuals to L side 0/2, reporting visuals to R 2/2 times. Fair tracking with max verbal cues.              Glasses: none present                                                   Hearing: HOH     RASS: 0  CAM-ICU: (NT)  Delirium    UE Assessment:  Hand Dominance: Right [x]   Left []      ROM Strength STM goal: PRN   RUE  Limited shoulder flexion; tightness.   A/AROM: no active range with max cues and physical assist; poor, intermittent grasp and FMC 1/5 proximal  3-/5 distal WFL for ADLS; 3+/5     LUE A/AROM grossly 90; WFL distal with impaired FMC 3+/5 WFL for ADLS; 4-/5       Sensation: No c/o numbness/tingling in extremities. *continue to assess  Tone: WNL   Edema: [facility]     Functional Assessment:  AM-PAC Daily Activity Raw Score: 6/24   Initial Eval Status  Date: 3/4 Re-eval Status  Date: 3/11 Treatment Status  Date: STGs = LTGs  Time frame: 7-14 days   Feeding Dep/NGT NPO  Limited ROM and strength in dominant hand. No functional movement observed.     Re-assess when indicated   Grooming Dep  (HOH assist to initiate tasks- hand washing and holding onto wash cloth to wash face)  Pt required Max A to bring B UE's to midline Dep  Patient with increased weakness/grip limited ability to complete basic ADLs.                        Mod A   while seated supported and using B  UE's for light grooming.     UB dressing/bathing Dep Dep                        Mod A  While seated supported.       LB dressing/bathing Dep Dep                        Max A   In supported position with AE PRN     Toileting Dep Dep                        Max A     Bed Mobility  Supine to sit:   Dep+2    Sit to supine:   Dep+2 Supine to Sit:  Max A x2    Sit to supine:  Max A x2                        Mod A     Functional Transfers Sit to stand:   Max A  (2nd person to monitor medical lines and guide hand placement)  Stand to sit:   Max A Sit to stand:  Max A x2  With no AD and max vcs for posture and body mechanics.                        Max A  sit<>stand/functional bathroom transfers using AD/DME as needed for balance and safety   Functional Mobility Mod A+2   side step to [facility] NT  Patient unable to tolerate                       Max A   functional/bathroom mobility using AD as needed & demonstrating G safety     Balance Sitting:     Static:  Min A    Dynamic:Mod A  Standing: Max A+ 1-2 Sitting:     Static:  Max/Min A    Dynamic:Max A  Standing: Max A  x2  SBA dynamic sitting balance;   Max A dynamic standing balance  during ADL tasks & transfers   Endurance/Activity Tolerance   F tolerance with light to moderate activity.  P tolerance with light activity.   G   tolerance with moderate activity/self care routine   Visual/  Perceptual Impaired: visual awareness; body awareness      Impaired: visual awareness; body awareness,safety.                   Vitals:   HR at rest: 100 bpm HR at end of session: 109 bpm   Spo2 at rest: 95% Spo2 at end of session 96%   BP at rest:121/59 mmHg BP at end of session 107/50 mmHg       Treatment: OT treatment provided this date includes:    Bed mobility: Instruction on precautions prior to bed mobility to facilitate safe transfers and ADLS. Pt required 2 person assist for safe mobility due to complexity of medical condition, medical lines and deconditioning. Patient demonstrating poor  awareness and minimal BUE use for stabilization.      Delirium Prevention: Environmental and sensory modifications assessed and implemented to decrease ICU acquired delirium and to improve overall orientation, mentation and pt interaction with family/staff.      Neuromuscular Reeducation to facilitate balance/righting reactions for increased function with ADLs tasks: to improve core strength for improve positioning and posture for BUE use during ADL tasks.     Facilitation of Visual Perceptual Skills for increased safety and independence with ADLs. To improve environmental awareness and attention to visual stimuli, increase attention to task.     Proper Positioning/Alignment  for optimal healing    Skilled Monitoring of Vitals:  to include BP, spO2, and HR throughout session to maximize safety.    Sitting/Standing Balance/Tolerance:  to increase balance and activity tolerance during ADLs and facilitate proper posture and positioning.      Line management and environmental modifications made prior to and end of session to ensure patient safety and to increase efficiency of session.  Skilled monitoring of HR, O2 saturation, blood pressure and patient's response to activity performed throughout session.    Comments: OK from RN to see patient. Upon arrival, patient supine in bed, responding to questions and agreeable to session.  Pt demo poor tolerance with limited understanding of education/techniques. Patient with declining BP at end of session with decreased responsiveness to verbal and tactile stimuli. Nursing aware of patient condition, returned to supine.  At end of session, patient in bed with pillow placed under LUE for comfort & edema control. Call light within reach, all lines and tubes intact. Pt instructed on use of call light for assistance and fall prevention.    Patient presents with decreased ROM/strength,activity tolerance, dynamic balance, functional mobility and cognitive deficits limiting completion  of ADLs and safety.  Pt can benefit from continued skilled OT to increase safety, functional independence and quality of life.     Rehab Potential: Good for established goals    Patient / Family Goal: Pt with minimal verbalizations, unable to report.    Patient was instructed/educated on diagnosis, prognosis/goals and plan of care. Pt demonstrated limited understanding.      Evaluation Complexity: Moderate     ?? History: Expanded chart review of consults, imaging, and psychosocial history related to current functional performance.   ?? Exam: 5+ performance deficits identified limiting functional independence and safe return home   ?? Assistance/Modification: Min/mod assistance or modifications required  to perform tasks. May have comorbidities that affect occupational performance.    []  Malnutrition indicators have been identified and nursing has been notified to ensure a dietitian consult is ordered.      Time In: 10:08 AM             Time Out: 10:34 AM         Total Treatment time: 11 minutes   Min Units   OT Eval Low 97165     OT Eval Medium 97166     OT Eval High     OT Re-Eval 97168 x 1   Therapeutic Ex 97110     Therapeutic Activities 97530     ADL/Self Care 97535 11 1   Orthotic Management 97760     Neuro Re-Ed 97112     Non-Billable Time        Evaluation time includes thorough review of current medical information, gathering information on past medical history/social history and prior level of function, completion of standardized testing/informal observation of tasks, assessment of data and development of POC/Goals.     [address] OTD, OTR/L, [address]

## 2020-11-19 NOTE — Plan of Care (Signed)
Problem: Inadequate oral food/beverage intake (NI-2.1)  Goal: Food and/or Nutrient Delivery  Description: Individualized approach for food/nutrient provision.  Outcome: Met This Shift

## 2020-11-19 NOTE — Progress Notes (Signed)
Intensive Care Unit  Critical Care Consult  Daily Progress Note 11/19/2020    Date of Admission: 11/09/2020  Admission to ICU: 11/18/2020    CC: Critical Care after L Frontal Craniotomy for [facility]    Hospital Course/ Overnight Events:  87 year old male presented from [facility] with concern for SDH. Working with PT in am patient developed R sided weakness and aphasia.??Reportedly fell at the nursing facility. Hx: ASA and Plavix for CAD. CT scan at [facility] showed Left-sided subacute on chronic subdural hematoma.  3/2 Left fronto/temporal burr hole and subdural drain placement   Urology consulted for Left hydronephrosis, left distal ureteral 7 mm calculi. Vascular consulted for IVF filter placement d/t possible subsegmental PE.   3/3 Cystoscopy and left stent insertion. Vascular placed IVC filter.   3/4 No acute events overnight. Speech worked with patient, NPO/Tube feed. NSGY going to remove drain today d/t minimal output. Spoke with Urology; Rocephin x 7 days.   3/9 Patient remained in hospital, working with PT/OT waiting to go to SNF. Conversation with NSGY about patient condition and prognosis.  Family decided to move forward and have L frontal Craniotomy for SDH.   3/10 Patient to OR with neurosurgery for L frontal craniotomy for SDH today. Transfer to ICU for follow up care. Family at the bedside. Repeat CT head 3/11. Lab work sent.   3/11 Decreased UOP overnight.  Bolus 1 L given. Catheter flushed per standing Urology order this am. CT scan - increased pneumocephalus L frontal. Non-rebreather on 2 hr, off 2 hr.   PHYSICAL EXAM:    BP (!) 98/55    Pulse 102    Temp 98.2 ??F (36.8 ??C) (Temporal)    Resp 21    Ht 6\' 2"  (1.88 m)    Wt 220 lb 3.2 oz (99.9 kg)    SpO2 96%    BMI 28.27 kg/m??     Intake/Output Summary (Last 24 hours) at 11/19/2020 0844  Last data filed at 11/19/2020 0800  Gross per 24 hour   Intake 2193.75 ml   Output 610 ml   Net 1583.75 ml        General appearance:  Comfortable.   Pain Description: denies  discomfort     GCS:  14  4 - Opens eyes on own   6 - Follows simple motor commands  4 - Seems confused, disoriented- self/month of birthday    Pupil size: Left 2 mm  Right 2 mm  Pupil reaction: Yes  Wiggles fingers: Left No Right No  Hand grasp:  Left decreased    Right decreased  Wiggles toes: Left Yes    Right Yes  Plantar flexion: Left decreased    Right decreased  -Increased movement of extremities today.     CONSTITUTIONAL: No acute distress, lying in hospital bed.   NEUROLOGIC: PERRL  CARDIOVASCULAR: S1 S2, regular rate, regular rhythm, no murmur/gallop/rub. Monitor: SR/ST  PULMONARY: Bilaterally clear, diminished. No rhonchi/rales/wheezes, no use of accessory muscles. Room Air. , Non-rebreather for pneumocephalus  RENAL:Foley to gravity, dark yellow urine- flushed per order, Urology notified  ABDOMEN: soft, nontender, nondistended, nontympanic, normal bowel sounds. Failed speech eval; Corpak- TF to start   MUSCULOSKELETAL: moves all extremities purposefully  R sided weakness > L sided weakness.   SKIN/EXTREMITIES: no rashes/ecchymosis, no edema/clubbing, warm/dry, good capillary refill     LINES:   L Radial A-line  L PIV    CONSULTS:   Neurosurgery    Past Medical History:   Diagnosis Date   ???  Acute MI (HCC)     pt had mi 1997   ??? CAD (coronary artery disease)    ??? Cholesterol serum increased    ??? Hyperlipidemia    ??? Prostate cancer (HCC)    ??? Prostate cancer (HCC)        ASSESSMENT/PLAN:       ?? Neuro: L Frontal Craniotomy for SDH. 3/2 L frontal temoporal burr hole YI:[id number] subacute on chronic subdural hematoma. Monitor neuro status, Neurosurgery following, Keppra for seizure prophylaxis.   ?? CV: No acute issues. Hx of Hyperlipidemia, CAD and MI.Monitor hemodynamics. BP goal less than 140. PRN hydralazine & labetalol. Statin.  ?? Pulm: No acute issues.  Monitor RR & SpO2.   ?? GI: Dysphagia. Hx: NGT, modified diet. NPO. Corpak- TF to start.  Monitor bowel function. Speech re- evaluation; failed  ?? Renal:  3/3 Left hydronephrosis, left distal ureteral calculi s/p Cystoscopy and L stent insertion. Urology following. Monitor BUN & Cr. Monitor electrolytes & replace as needed.  Monitor urine output.   ?? ID: No acute issues. Monitor WBC. Ancef-completed.  ?? Endocrine: No acute issues. Monitor BS.   ?? MSK: No acute issues. ROM. turn & reposition. PT/OT when able   ?? Heme: No acute issues. Hx: Prostate CA. Monitor CBC.        Bowel regimen: Glycolax  Pain control/Sedation: Morphine PRN, Oxycodone and Tylenol  DVT prophylaxis: SCD  GI prophylaxis: Protonix  Foley: Keep in place for critical care monitoring of fluid balance.  Urology following.   Ancillary consults:  Neurosurgery and Palliative medicine, Urology   Patient/Family update:  Support given to patient at bedside. Spoke with patient's son Gerlene Burdock at bedside. Spoke with neurosurgery. Spoke with Urology. Will update when available.   Code status:  Limited No intubation/reintubation, Yes to CPR,Yes to Shock,Yes to resuscitative medications.     Disposition: ICU    Electronically signed by Carlyle Lipa, APRN - CNP on 11/19/2020 at 8:44 AM

## 2020-11-19 NOTE — Plan of Care (Signed)
Problem: Pain:  Goal: Pain level will decrease  Description: Pain level will decrease  Outcome: Met This Shift  Goal: Control of acute pain  Description: Control of acute pain  Outcome: Met This Shift  Goal: Control of chronic pain  Description: Control of chronic pain  Outcome: Met This Shift     Problem: Skin Integrity:  Goal: Will show no infection signs and symptoms  Description: Will show no infection signs and symptoms  Outcome: Met This Shift  Goal: Absence of new skin breakdown  Description: Absence of new skin breakdown  Outcome: Met This Shift     Problem: Pain:  Goal: Pain level will decrease  Description: Pain level will decrease  Outcome: Met This Shift

## 2020-11-19 NOTE — Progress Notes (Signed)
SPEECH/LANGUAGE PATHOLOGY  CLINICAL ASSESSMENT OF SWALLOWING FUNCTION   and PLAN OF CARE    PATIENT NAME:  Tyler Rhodes  (male)     MRN:  [id number]    DOB:  Aug 19, 1929  (87 y.o.)  STATUS:  Inpatient: Room 3821/3821-A    TODAY'S DATE:  11/19/2020   11/18/20 1600   SLP eval and treat Start: 11/18/20 1600, End: 11/18/20 1600, ONE TIME, Standing Count: 1 Occurrences, R    Vangie Bicker, APRN - CNP    REASON FOR REFERRAL: Assessment of oropharyngeal swallow function   EVALUATING THERAPIST: Criss Alvine                 RESULTS:    DYSPHAGIA DIAGNOSIS:   Clinical indicators of limited intake on exam, not able to determine type or severity of dysphagia       DIET RECOMMENDATIONS:  NPO       Recommend smallest FR NGT  for conservation of sensory and motor sensations for the patient.     FEEDING RECOMMENDATIONS:     Assistance level:  Not applicable      Compensatory strategies recommended: Not applicable      Discussed recommendations with nursing and/or faxed report to referring provider: Yes    SPEECH THERAPY  PLAN OF CARE   The dysphagia POC is established based on physician order, dysphagia diagnosis and results of clinical assessment     Skilled SLP intervention for dysphagia management up to 5x per week until goals met, pt plateaus in function and/or discharged from hospital    Conditions Requiring Skilled Therapeutic Intervention for dysphagia:    Patient is performing below functional baseline d/t  current acute condition, Multiple diagnoses, multiple medications, and increased dependency upon caregivers.  Decreased oral sensation    Specific dysphagia interventions to include:     ongoing skilled PO analysis to determine if PO diet can be initiated     Specific instructions for next treatment:  ongoing skilled PO analysis to determine if PO diet can be initiated   Patient Treatment Goals:    Short Term Goals:  Pt will participate in ongoing evaluation of swallow function to determine when PO diet can be safely  initiated    Long Term Goals:   Pt will maintain adequate nutrition/hydration via PO intake of the least restrictive oral diet with implementation of safe swallow/ compensatory strategies and decrease signs/symptoms of aspiration to less than 1 x/day.      Patient/family Goal:    Did not state.  Will further assess during treatment.    Plan of care discussed with Patient   The Patient did not demonstrate complete understanding of the diagnosis, prognosis and plan of care     Rehabilitation Potential/Prognosis: fair                    ADMITTING DIAGNOSIS: Acute on chronic intracranial subdural hematoma (HCC) [I62.01, I62.03]    VISIT DIAGNOSIS:       PATIENT REPORT/COMPLAINT: patient currently NPO pending results of this evaluation  RN cleared patient for participation in assessment     yes     PRIOR LEVEL OF SWALLOW FUNCTION:    PAST HISTORY OF DYSPHAGIA?: yes    Home diet: Regular consistency solids (IDDSI level 7) with  thin liquids (IDDSI level 0)  Current Diet Order:  Diet NPO Exceptions are: Sips of Water with Meds    PROCEDURE:  Consistencies Administered During the Evaluation   Liquids: NA  Solids:  pureed foods      Method of Intake:   spoon  Fed by clinician      Position:   Seated, upright    CLINICAL ASSESSMENT:  Oral Stage:       Impaired oral initiation   Decreased sensation in the oral cavity- patient let food sit on lip and tongue.     Pharyngeal Stage:      Unable to fully assess due to limited PO intake.      Cognition:   Did not follow commands and Confusion noted    Oral Peripheral Examination   Adequate lingual/labial strength     Current Respiratory Status    room air     Parameters of Speech Production  Respiration:  Adequate for speech production  Quality:   Within functional limits  Intensity: Loud    Volitional Swallow: present     Volitional Cough:   present     Pain: No pain reported.    EDUCATION:   The Psychologist, clinical (SLP) completed education regarding results of evaluation  and that intervention is warranted at this time.  Learner: Patient  Education: Reviewed results and recommendations of this evaluation and Reviewed diet and strategies  Evaluation of Education:  No evidence of learning    This plan may be re-evaluated and revised as warranted.      Evaluation Time includes thorough review of current medical information, gathering information on past medical history/social history and prior level of function, completion of standardized testing/informal observation of tasks, assessment of data and education on plan of care and goals.    [x] The admitting diagnosis and active problem list, have been reviewed prior to initiation of this evaluation.        ACTIVE PROBLEM LIST:   Patient Active Problem List   Diagnosis    Elevated serum cholesterol    Hyperlipidemia    Acute MI ([place])    Sepsis ([place])    Arthritis of knee, right    Anemia    Acute on chronic intracranial subdural hematoma (HCC)    Single subsegmental pulmonary embolism without acute cor pulmonale (HCC)    Delirium    Slurred speech    Hydronephrosis with urinary obstruction due to ureteral calculus    Pleural effusion    Subdural hemorrhage [facility])    Palliative care by specialist         CPT code:  22025  bedside swallow eval    Criss Alvine B.S.  Speech Language Pathology Graduate Student

## 2020-11-19 NOTE — Progress Notes (Signed)
Department of Neurosurgery  Progress Note    CHIEF COMPLAINT: s/p left burr hole 3/2    SUBJECTIVE:  Resting peacefully.  Talking with nursing earlier this AM.    REVIEW OF SYSTEMS :  Not answering all questions appropriately    OBJECTIVE:   VITALS:  BP (!) 98/55    Pulse 102    Temp 98.2 ??F (36.8 ??C) (Temporal)    Resp 21    Ht 6\' 2"  (1.88 m)    Wt 220 lb 3.2 oz (99.9 kg)    SpO2 96%    BMI 28.27 kg/m??     PHYSICAL:  Neurologic: lethargic  PERRL  Motor Exam:  MAE.  Following commands for nursing earlier this AM  Sensory:  Sensory intact  Incision c/d/i      DATA:  CBC:   Lab Results   Component Value Date    WBC 22.4 11/19/2020    RBC 4.47 11/19/2020    HGB 12.6 11/19/2020    HCT 39.1 11/19/2020    MCV 87.5 11/19/2020    MCH 28.2 11/19/2020    MCHC 32.2 11/19/2020    RDW 15.6 11/19/2020    PLT 268 11/19/2020    MPV 11.1 11/19/2020     BMP:    Lab Results   Component Value Date    NA 138 11/19/2020    K 3.8 11/19/2020    K 4.1 11/15/2020    CL 103 11/19/2020    CO2 15 11/19/2020    BUN 22 11/19/2020    LABALBU 3.1 11/19/2020    LABALBU 4.5 01/06/2011    CREATININE 1.1 11/19/2020    CALCIUM 8.2 11/19/2020    GFRAA >60 11/19/2020    LABGLOM >60 11/19/2020    GLUCOSE 108 11/19/2020    GLUCOSE 107 01/08/2011     PT/INR:    Lab Results   Component Value Date    PROTIME 13.3 11/09/2020    PROTIME 13.0 01/06/2011    INR 1.2 11/09/2020     PTT:    Lab Results   Component Value Date    APTT 27.9 11/09/2020   [APTT}    Current Inpatient Medications  Current Facility-Administered Medications: sodium chloride flush 0.9 % injection 5-40 mL, 5-40 mL, IntraVENous, 2 times per day  sodium chloride flush 0.9 % injection 5-40 mL, 5-40 mL, IntraVENous, PRN  oxyCODONE (ROXICODONE) immediate release tablet 5 mg, 5 mg, Oral, Q4H PRN **OR** oxyCODONE HCl (OXY-IR) immediate release tablet 10 mg, 10 mg, Oral, Q4H PRN  morphine (PF) injection 2 mg, 2 mg, IntraVENous, Q4H PRN **OR** morphine sulfate (PF) injection 4 mg, 4 mg, IntraVENous, Q4H  PRN  ceFAZolin (ANCEF) 2000 mg in sterile water 20 mL IV syringe, 2,000 mg, IntraVENous, Q8H  hydrALAZINE (APRESOLINE) injection 10 mg, 10 mg, IntraVENous, Q10 Min PRN  labetalol (NORMODYNE;TRANDATE) injection 10 mg, 10 mg, IntraVENous, Q10 Min PRN  0.9 % sodium chloride infusion, , IntraVENous, Continuous  levETIRAcetam (KEPPRA) 500 mg/100 mL IVPB, 500 mg, IntraVENous, Q12H  atorvastatin (LIPITOR) tablet 20 mg, 20 mg, Oral, Daily  polyethylene glycol (GLYCOLAX) packet 17 g, 17 g, Oral, Daily  acetaminophen (TYLENOL) tablet 650 mg, 650 mg, Oral, Q4H PRN  pantoprazole (PROTONIX) tablet 40 mg, 40 mg, Oral, QAM AC  bisacodyl (DULCOLAX) suppository 10 mg, 10 mg, Rectal, Daily PRN  isosorbide mononitrate (IMDUR) extended release tablet 120 mg, 120 mg, Oral, Daily  ondansetron (ZOFRAN-ODT) disintegrating tablet 4 mg, 4 mg, Oral, Q8H PRN **OR** ondansetron (ZOFRAN) injection 4 mg,  4 mg, IntraVENous, Q6H PRN    ASSESSMENT:   s/p left burr hole 3/2,   Left craniotomy on 3/10  Head CT shows improvement of midline shift and reduction of ICH. +pneumocephalus     PLAN:  -HOB to comfort  -Serial neurological exams  -No AC or AP  -100% O2 non rebreather, 2 hours on, 2 hours off for pneumocephalus      Electronically signed by Duffy Bruce, PA on 11/19/2020 at 11:08 AM

## 2020-11-20 ENCOUNTER — Inpatient Hospital Stay: Admit: 2020-11-20 | Payer: MEDICARE | Primary: Internal Medicine

## 2020-11-20 LAB — BLOOD GAS, ARTERIAL
B.E.: -6.4 mmol/L — ABNORMAL LOW (ref <=3.0)
B.E.: -5.1 mmol/L — ABNORMAL LOW (ref <=3.0)
COHb: 0.3 % (ref 0.0–1.5)
COHb: 0.2 % (ref 0.0–1.5)
Date Analyzed: 20220312
Date Analyzed: 20220312
HCO3: 17.1 mmol/L — ABNORMAL LOW (ref 22.0–26.0)
HCO3: 17.7 mmol/L — ABNORMAL LOW (ref 22.0–26.0)
HHb: 1 % (ref 0.0–5.0)
HHb: 0.6 % (ref 0.0–5.0)
Lab: 43752
Lab: 43752
MetHb: 0.3 % (ref 0.0–1.5)
MetHb: 0.4 % (ref 0.0–1.5)
O2 Sat: 99 % — ABNORMAL HIGH (ref 92.0–98.5)
O2 Sat: 99.4 % — ABNORMAL HIGH (ref 92.0–98.5)
O2Hb: 98.4 % — ABNORMAL HIGH (ref 94.0–97.0)
O2Hb: 98.8 % — ABNORMAL HIGH (ref 94.0–97.0)
Operator ID: 2577
Operator ID: 1868
PCO2: 28.2 mmHg — ABNORMAL LOW (ref 35.0–45.0)
PCO2: 26.6 mmHg — ABNORMAL LOW (ref 35.0–45.0)
PO2: 220.4 mmHg — ABNORMAL HIGH (ref 75.0–100.0)
PO2: 391.3 mmHg — ABNORMAL HIGH (ref 75.0–100.0)
Pt Temp: 37 C
Pt Temp: 37 C
Time Analyzed: 215
Time Analyzed: 1020
pH, Blood Gas: 7.4 (ref 7.350–7.450)
pH, Blood Gas: 7.44 (ref 7.350–7.450)
tHb (est): 12.2 g/dL (ref 11.5–16.5)
tHb (est): 12 g/dL (ref 11.5–16.5)

## 2020-11-20 LAB — COMPREHENSIVE METABOLIC PANEL
ALT: 7 U/L (ref 0–40)
AST: 13 U/L (ref 0–39)
Albumin: 3.1 g/dL — ABNORMAL LOW (ref 3.5–5.2)
Alkaline Phosphatase: 80 U/L (ref 40–129)
Anion Gap: 15 mmol/L (ref 7–16)
BUN: 39 mg/dL — ABNORMAL HIGH (ref 6–23)
CO2: 19 mmol/L — ABNORMAL LOW (ref 22–29)
Calcium: 8.5 mg/dL — ABNORMAL LOW (ref 8.6–10.2)
Chloride: 107 mmol/L (ref 98–107)
Creatinine: 2.1 mg/dL — ABNORMAL HIGH (ref 0.7–1.2)
GFR African American: 36
GFR Non-African American: 30 mL/min/{1.73_m2} (ref >=60)
Glucose: 183 mg/dL — ABNORMAL HIGH (ref 74–99)
Potassium: 3.6 mmol/L (ref 3.5–5.0)
Sodium: 141 mmol/L (ref 132–146)
Total Bilirubin: 0.2 mg/dL (ref 0.0–1.2)
Total Protein: 6.5 g/dL (ref 6.4–8.3)

## 2020-11-20 LAB — UREA NITROGEN, URINE: Urea Nitrogen, Ur: 250 mg/dL — ABNORMAL LOW (ref 800–1666)

## 2020-11-20 LAB — CREATININE, RANDOM URINE: Creatinine, Ur: 284 mg/dL — ABNORMAL HIGH (ref 40–278)

## 2020-11-20 LAB — CBC WITH AUTO DIFFERENTIAL
Basophils %: 0.4 % (ref 0.0–2.0)
Basophils Absolute: 0 E9/L (ref 0.00–0.20)
Eosinophils %: 0 % (ref 0.0–6.0)
Eosinophils Absolute: 0 E9/L — ABNORMAL LOW (ref 0.05–0.50)
Hematocrit: 36.7 % — ABNORMAL LOW (ref 37.0–54.0)
Hemoglobin: 11.6 g/dL — ABNORMAL LOW (ref 12.5–16.5)
Lymphocytes %: 8 % — ABNORMAL LOW (ref 20.0–42.0)
Lymphocytes Absolute: 1.82 E9/L (ref 1.50–4.00)
MCH: 28.2 pg (ref 26.0–35.0)
MCHC: 31.6 % — ABNORMAL LOW (ref 32.0–34.5)
MCV: 89.3 fL (ref 80.0–99.9)
MPV: 11.2 fL (ref 7.0–12.0)
Monocytes %: 4.4 % (ref 2.0–12.0)
Monocytes Absolute: 0.91 E9/L (ref 0.10–0.95)
Myelocyte Percent: 2.6 % (ref 0–0)
Neutrophils %: 85 % — ABNORMAL HIGH (ref 43.0–80.0)
Neutrophils Absolute: 19.98 E9/L — ABNORMAL HIGH (ref 1.80–7.30)
Platelets: 161 E9/L (ref 130–450)
RBC: 4.11 E12/L (ref 3.80–5.80)
RDW: 16.2 fL — ABNORMAL HIGH (ref 11.5–15.0)
WBC: 22.7 E9/L — ABNORMAL HIGH (ref 4.5–11.5)

## 2020-11-20 LAB — SODIUM, URINE, RANDOM: Sodium, Ur: 24 mmol/L

## 2020-11-20 LAB — CALCIUM, IONIZED: Calcium, Ionized: 1.26 mmol/L (ref 1.15–1.33)

## 2020-11-20 LAB — MAGNESIUM: Magnesium: 2.1 mg/dL (ref 1.6–2.6)

## 2020-11-20 LAB — PHOSPHORUS: Phosphorus: 3.4 mg/dL (ref 2.5–4.5)

## 2020-11-20 MED ORDER — CEFTRIAXONE SODIUM 1 G IJ SOLR
1 g | INTRAMUSCULAR | Status: DC
Start: 2020-11-20 — End: 2020-11-23
  Administered 2020-11-20 – 2020-11-22 (×3): via INTRAVENOUS

## 2020-11-20 MED ORDER — DILTIAZEM HCL 25 MG/5ML IV SOLN
25 MG/5ML | Freq: Once | INTRAVENOUS | Status: AC
Start: 2020-11-20 — End: 2020-11-20
  Administered 2020-11-20: 15:00:00 via INTRAVENOUS

## 2020-11-20 MED ORDER — SODIUM CHLORIDE 0.9 % IV BOLUS
0.9 % | Freq: Once | INTRAVENOUS | Status: AC
Start: 2020-11-20 — End: 2020-11-19
  Administered 2020-11-20: 02:00:00 via INTRAVENOUS

## 2020-11-20 MED ORDER — DILTIAZEM HCL 25 MG/5ML IV SOLN
255 MG/5ML | Freq: Once | INTRAVENOUS | Status: DC
Start: 2020-11-20 — End: 2020-11-22

## 2020-11-20 MED ORDER — DILTIAZEM HCL 100 MG IV SOLR
100 MG | INTRAVENOUS | Status: DC
Start: 2020-11-20 — End: 2020-11-22
  Administered 2020-11-20 – 2020-11-21 (×2): via INTRAVENOUS

## 2020-11-20 MED ORDER — METOPROLOL TARTRATE 5 MG/5ML IV SOLN
55 MG/ML | Freq: Once | INTRAVENOUS | Status: AC
Start: 2020-11-20 — End: 2020-11-20
  Administered 2020-11-20: 07:00:00 via INTRAVENOUS

## 2020-11-20 MED ORDER — SODIUM CHLORIDE 0.9 % IV BOLUS
0.9 % | Freq: Once | INTRAVENOUS | Status: AC
Start: 2020-11-20 — End: 2020-11-20
  Administered 2020-11-20: 10:00:00 via INTRAVENOUS

## 2020-11-20 MED ORDER — DILTIAZEM HCL 125 MG/25ML IV SOLN
125 MG/25ML | INTRAVENOUS | Status: DC
Start: 2020-11-20 — End: 2020-11-20

## 2020-11-20 MED ORDER — METOPROLOL TARTRATE 5 MG/5ML IV SOLN
55 MG/ML | Freq: Once | INTRAVENOUS | Status: AC
Start: 2020-11-20 — End: 2020-11-20
  Administered 2020-11-20: 05:00:00 via INTRAVENOUS

## 2020-11-20 MED FILL — POLYETHYLENE GLYCOL 3350 17 G PO PACK: 17 g | ORAL | Qty: 1

## 2020-11-20 MED FILL — DILTIAZEM HCL 100 MG IV SOLR: 100 mg | INTRAVENOUS | Qty: 1

## 2020-11-20 MED FILL — LEVETIRACETAM IN NACL 500 MG/100ML IV SOLN: 500 MG/100ML | INTRAVENOUS | Qty: 100

## 2020-11-20 MED FILL — MAPAP 325 MG PO TABS: 325 mg | ORAL | Qty: 2

## 2020-11-20 MED FILL — METOPROLOL TARTRATE 5 MG/5ML IV SOLN: 5 mg/mL | INTRAVENOUS | Qty: 5

## 2020-11-20 MED FILL — BD POSIFLUSH 0.9 % IV SOLN: 0.9 % | INTRAVENOUS | Qty: 10

## 2020-11-20 MED FILL — CEFTRIAXONE SODIUM 1 G IJ SOLR: 1 g | INTRAMUSCULAR | Qty: 1000

## 2020-11-20 MED FILL — ATORVASTATIN CALCIUM 20 MG PO TABS: 20 mg | ORAL | Qty: 1

## 2020-11-20 MED FILL — BD POSIFLUSH 0.9 % IV SOLN: 0.9 % | INTRAVENOUS | Qty: 100

## 2020-11-20 NOTE — Progress Notes (Signed)
Department of Neurosurgery  Attending Progress Note    CHIEF COMPLAINT:    SUBJECTIVE:  No new events.  No commands overnight     ROS:    OBJECTIVE  Physical  VITALS:  BP (!) 114/56    Pulse 133    Temp 97.6 ??F (36.4 ??C) (Axillary)    Resp 23    Ht 6\' 2"  (1.88 m)    Wt 220 lb 3.2 oz (99.9 kg)    SpO2 94%    BMI 28.27 kg/m??   NEUROLOGIC:  Sleepy an difficult to arouse, PERRLA. Will withdraw in LE to noxious stimuli.    Data  CBC:   Lab Results   Component Value Date    WBC 22.7 11/20/2020    RBC 4.11 11/20/2020    HGB 11.6 11/20/2020    HCT 36.7 11/20/2020    MCV 89.3 11/20/2020    MCH 28.2 11/20/2020    MCHC 31.6 11/20/2020    RDW 16.2 11/20/2020    PLT 161 11/20/2020    MPV 11.2 11/20/2020     Current Inpatient Medications  Current Facility-Administered Medications: sodium chloride flush 0.9 % injection 5-40 mL, 5-40 mL, IntraVENous, 2 times per day  sodium chloride flush 0.9 % injection 5-40 mL, 5-40 mL, IntraVENous, PRN  hydrALAZINE (APRESOLINE) injection 10 mg, 10 mg, IntraVENous, Q10 Min PRN  labetalol (NORMODYNE;TRANDATE) injection 10 mg, 10 mg, IntraVENous, Q10 Min PRN  0.9 % sodium chloride infusion, , IntraVENous, Continuous  levETIRAcetam (KEPPRA) 500 mg/100 mL IVPB, 500 mg, IntraVENous, Q12H  atorvastatin (LIPITOR) tablet 20 mg, 20 mg, Oral, Daily  polyethylene glycol (GLYCOLAX) packet 17 g, 17 g, Oral, Daily  acetaminophen (TYLENOL) tablet 650 mg, 650 mg, Oral, Q4H PRN  pantoprazole (PROTONIX) tablet 40 mg, 40 mg, Oral, QAM AC  bisacodyl (DULCOLAX) suppository 10 mg, 10 mg, Rectal, Daily PRN  isosorbide mononitrate (IMDUR) extended release tablet 120 mg, 120 mg, Oral, Daily  ondansetron (ZOFRAN-ODT) disintegrating tablet 4 mg, 4 mg, Oral, Q8H PRN **OR** ondansetron (ZOFRAN) injection 4 mg, 4 mg, IntraVENous, Q6H PRN    ASSESSMENT AND PLAN:    POD 2 s/p left crani for SDH.  Exam is poor.  Will continue to treat  UTI.  Will follow    02-20-1984, MD

## 2020-11-20 NOTE — Plan of Care (Signed)
Problem: Pain:  Goal: Pain level will decrease  Description: Pain level will decrease  Outcome: Met This Shift     Problem: Skin Integrity:  Goal: Will show no infection signs and symptoms  Description: Will show no infection signs and symptoms  Outcome: Met This Shift  Goal: Absence of new skin breakdown  Description: Absence of new skin breakdown  Outcome: Met This Shift     Problem: Pain:  Goal: Pain level will decrease  Description: Pain level will decrease  Outcome: Met This Shift     Problem: Injury - Risk of, Healthcare-Acquired Condition:  Goal: Will remain free from falls  Description: Will remain free from falls  Outcome: Met This Shift     Problem: Nutrition Deficit - Risk of:  Goal: Ability to achieve adequate nutritional intake will improve  Description: Ability to achieve adequate nutritional intake will improve  Outcome: Met This Shift  Goal: Maintenance of adequate weight for body size and type will improve to within specified parameters  Description: Maintenance of adequate weight for body size and type will improve to within specified parameters  Outcome: Met This Shift

## 2020-11-20 NOTE — Progress Notes (Addendum)
Metoprolol helped for about 1 hour per the nurse.  Nicom 20% responsive to fluid challenge.  Labs returned prerenal, again.  Ordered 1L NS bolus.  CXR appears nonconcerning    Electronically signed by Katharina Caper, MD on 11/20/2020 at 4:38 AM

## 2020-11-20 NOTE — Plan of Care (Signed)
Problem: Pain:  Goal: Pain level will decrease  Description: Pain level will decrease  11/20/2020 2012 by Sherril Croon  Outcome: Met This Shift     Problem: Pain:  Goal: Control of acute pain  Description: Control of acute pain  Outcome: Met This Shift     Problem: Pain:  Goal: Control of chronic pain  Description: Control of chronic pain  Outcome: Met This Shift     Problem: Skin Integrity:  Goal: Will show no infection signs and symptoms  Description: Will show no infection signs and symptoms  11/20/2020 2012 by Sherril Croon  Outcome: Met This Shift     Problem: Skin Integrity:  Goal: Absence of new skin breakdown  Description: Absence of new skin breakdown  11/20/2020 2012 by Sherril Croon  Outcome: Met This Shift     Problem: Pain:  Goal: Pain level will decrease  Description: Pain level will decrease  11/20/2020 2012 by Sherril Croon  Outcome: Met This Shift

## 2020-11-20 NOTE — Progress Notes (Signed)
Still in afibRVR - metoprolol only helped for 30 min per nurse.  No response of urine output with bolus last night.  Less responsive to pain per the nurse, but I was told in signout that repeat CT's of the head would be futile.  Already had IVCf for prior PE.  ABG shows hypocarbia with normal oxygen response to 15LNR.  Consider symptomatic left pleural effusion seen on CT yesterday, will get CXR.   Also ordered another dose of metoprolol.    Electronically signed by Katharina Caper, MD on 11/20/2020 at 2:21 AM

## 2020-11-20 NOTE — Plan of Care (Signed)
Problem: Non-Violent Restraints  Goal: No harm/injury to patient while restraints in use  Outcome: Met This Shift     Problem: Non-Violent Restraints  Goal: Patient's dignity will be maintained  Outcome: Met This Shift     Problem: Non-Violent Restraints  Goal: Removal from restraints as soon as assessed to be safe  Outcome: Not Met This Shift

## 2020-11-20 NOTE — Progress Notes (Signed)
[facility]uro Science Intensive Care Unit  Critical Care  Daily Progress Note 11/20/2020    Date of Admission: 11/18/20  CC:  Critical management of left crani for evac of SDH.       HOSPITAL EVENTS  87 year old male presented??from [place]??with concern for SDH. Working with PT in am patient developed R sided weakness and aphasia.??Reportedly??fell at??the??nursing facility. Hx: ASA and Plavix for CAD. CT scan at [facility] showed??Left-sided subacute on chronic subdural hematoma.  3/2 Left fronto/temporal burr hole and subdural drain placement   Urology consulted for??Left hydronephrosis, left distal ureteral??7 mm??calculi. Vascular consulted for IVF filter placement d/t??possible subsegmental PE.   3/3 Cystoscopy??and??left stent insertion. Vascular placed IVC filter.   3/4 No acute events overnight. Speech worked with patient, NPO/Tube feed.??NSGY going to remove drain today d/t minimal output.??Spoke with Urology; Rocephin x 7 days.   3/9 Patient remained in hospital, working with PT/OT waiting to go to SNF. Conversation with NSGY about patient condition and prognosis.  Family decided to move forward and have L frontal Craniotomy for SDH.   3/10 Patient to OR with neurosurgery for L frontal craniotomy for SDH today. Transfer to ICU for follow up care. Family at the bedside. Repeat CT head 3/11. Lab work sent.   3/11 Decreased UOP overnight.  Bolus 1 L given. Catheter flushed per standing Urology order this am. CT scan - increased pneumocephalus L frontal. Non-rebreather on 2 hr, off 2 hr.   3/12 In AF with RVR. Not responsive to metoprolol.     OVERNIGHT EVENTS: T max 97.6 F.  Did not require additional doses of antihypertensive agents or  insulin in the previous 24 hours.      PHYSICAL EXAM:    BP (!) 107/54    Pulse 142    Temp 97.3 ??F (36.3 ??C) (Axillary)    Resp 25    Ht 6\' 2"  (1.88 m)    Wt 220 lb 3.2 oz (99.9 kg)    SpO2 99%    BMI 28.27 kg/m??     Intake/Output Summary (Last 24 hours) at 11/20/2020 1000  Last data filed at  11/20/2020 0900  Gross per 24 hour   Intake 5969 ml   Output 355 ml   Net 5614 ml         General appearance:  Comfortable.       NEUROLOGIC:        GCS:    2 - Opens eyes with pain   4 - Moves part of body but does not remove noxious stimulus  1 - Makes no noise       Pupil size:  Left 2 mm  Right 2 mm  Pupil reaction: Yes   PERRLA  Wiggles fingers: Left No Right No  Hand grasp:   Left absent     Right absent  Wiggles toes: Left No    Right No  Plantar flexion: Left absent    Right absent      CONSTITUTIONAL: No acute distress  CARDIOVASCULAR: S1 S2,irregular rate,ir regular rhythm,Monitor:   PULMONARY:  Respirations unlabored. No rhonchi/rales/wheezes.  RENAL:  Foley to gravity, clear yellow urine.    ABDOMEN: Soft, nontender, nondistended, nontympanic, normal bowel sounds. Enteric tube with TF.   MUSCULOSKELETAL: Moves LUE to pain.   SKIN/EXTREMITIES: No rashes/ecchymosis, no edema/clubbing, warm/dry, good capillary refill.     IV ACCESS:   PIV      ASSESSMENT/PLAN:     Principal Problem:    Acute on chronic intracranial subdural hematoma (  HCC)  Active Problems:    Single subsegmental pulmonary embolism without acute cor pulmonale (HCC)    Delirium    Slurred speech    Hydronephrosis with urinary obstruction due to ureteral calculus    Pleural effusion    Subdural hemorrhage (HCC)    Palliative care by specialist  Resolved Problems:    * No resolved hospital problems. *          Neuro: Acute on Chronic SDH. S/p Crani for evac of SDH.s/p fall. Neurosurgery following.Monitor neuro status. Keppra.   CV: AF with RVR. Hx CAD. AMI. HLD. BP goal <160 mm Hg. Diltiazem for AF RVR. Start drip.PRN     Pulm:At risk for respiratory distress.  Monitor respiratory status  GI: NPO. Enteric tube with TF. Monitor bowel status  Renal: AKI FeNa <1 IV bolus, hydration.  Monitor uop, renal function and electrolytes  ID: Leukocytosis. Afebrile. Repeat urine culture.   Endocrine: Hyperglycemia. ISS.      MSK:.  Deconditioned  Heme:  Anemia. Monitor CBC        Bowel regime: Glycolax  Pain control/Sedation:  Acetaminophen  DVT prophylaxis: SCDs.     GI prophylaxis: TF  Glucose protocol:  ISS  Mouth/Eye care: As needed  Foley: Keep in place for critical care monitoring of fluid balance.    Consults: Trauma, Urology, Vascular surgery, Neurosurgery, Palliative.     Patient/Family update: Updated son. Questions answered.   Code status:  Limited. DNI      Disposition:  NSICU.      The patient is at significant risk for life-threatening neurological deterioration and death and requires ongoing critical care management. CC time 45 min Critical care time exclusive of teaching and procedures        Georgeann Oppenheim, DNP. APRN-CNP  11/20/2020  9:50 AM

## 2020-11-20 NOTE — Progress Notes (Signed)
Dr. Janee Morn notified of neuro status changes.

## 2020-11-20 NOTE — Plan of Care (Signed)
Problem: Non-Violent Restraints  Goal: Removal from restraints as soon as assessed to be safe  11/20/2020 0559 by Lonell Face, RN  Outcome: Completed     Problem: Non-Violent Restraints  Goal: No harm/injury to patient while restraints in use  11/20/2020 0559 by Lonell Face, RN  Outcome: Completed     Problem: Non-Violent Restraints  Goal: Patient's dignity will be maintained  11/20/2020 0559 by Lonell Face, RN  Outcome: Completed

## 2020-11-20 NOTE — Progress Notes (Signed)
Patient less alert and responsive at this time. Not currently interfering with medical therapies. Soft bilateral wrist restraints discontinued at this time.

## 2020-11-21 LAB — RESPIRATORY PANEL, MOLECULAR, WITH COVID-19
Adenovirus by PCR: NOT DETECTED
Bordetella parapertussis by PCR: NOT DETECTED
Bordetella pertussis by PCR: NOT DETECTED
Chlamydophilia pneumoniae by PCR: NOT DETECTED
Coronavirus 229E by PCR: NOT DETECTED
Coronavirus HKU1 by PCR: NOT DETECTED
Coronavirus NL63 by PCR: NOT DETECTED
Coronavirus OC43 by PCR: NOT DETECTED
Human Metapneumovirus by PCR: NOT DETECTED
Human Rhinovirus/Enterovirus by PCR: NOT DETECTED
Influenza A by PCR: NOT DETECTED
Influenza B by PCR: NOT DETECTED
Mycoplasma pneumoniae by PCR: NOT DETECTED
Parainfluenza Virus 1 by PCR: NOT DETECTED
Parainfluenza Virus 2 by PCR: NOT DETECTED
Parainfluenza Virus 3 by PCR: NOT DETECTED
Parainfluenza Virus 4 by PCR: NOT DETECTED
Respiratory Syncytial Virus by PCR: NOT DETECTED
SARS-CoV-2, PCR: NOT DETECTED

## 2020-11-21 LAB — COMPREHENSIVE METABOLIC PANEL
ALT: 8 U/L (ref 0–40)
AST: 20 U/L (ref 0–39)
Albumin: 2.7 g/dL — ABNORMAL LOW (ref 3.5–5.2)
Alkaline Phosphatase: 82 U/L (ref 40–129)
Anion Gap: 12 mmol/L (ref 7–16)
BUN: 47 mg/dL — ABNORMAL HIGH (ref 6–23)
CO2: 19 mmol/L — ABNORMAL LOW (ref 22–29)
Calcium: 8.1 mg/dL — ABNORMAL LOW (ref 8.6–10.2)
Chloride: 115 mmol/L — ABNORMAL HIGH (ref 98–107)
Creatinine: 1.5 mg/dL — ABNORMAL HIGH (ref 0.7–1.2)
GFR African American: 53
GFR Non-African American: 44 mL/min/{1.73_m2} (ref >=60)
Glucose: 169 mg/dL — ABNORMAL HIGH (ref 74–99)
Potassium: 3.9 mmol/L (ref 3.5–5.0)
Sodium: 146 mmol/L (ref 132–146)
Total Bilirubin: 0.3 mg/dL (ref 0.0–1.2)
Total Protein: 6.1 g/dL — ABNORMAL LOW (ref 6.4–8.3)

## 2020-11-21 LAB — EKG 12-LEAD
Atrial Rate: 163 {beats}/min
Q-T Interval: 274 ms
QRS Duration: 144 ms
QTc Calculation (Bazett): 440 ms
R Axis: -45 degrees
T Axis: -32 degrees
Ventricular Rate: 155 {beats}/min

## 2020-11-21 LAB — CBC WITH AUTO DIFFERENTIAL
Basophils %: 0.2 % (ref 0.0–2.0)
Basophils Absolute: 0.06 E9/L (ref 0.00–0.20)
Eosinophils %: 0 % (ref 0.0–6.0)
Eosinophils Absolute: 0.01 E9/L — ABNORMAL LOW (ref 0.05–0.50)
Hematocrit: 32.6 % — ABNORMAL LOW (ref 37.0–54.0)
Hemoglobin: 10.4 g/dL — ABNORMAL LOW (ref 12.5–16.5)
Immature Granulocytes #: 0.44 E9/L
Immature Granulocytes %: 1.7 % (ref 0.0–5.0)
Lymphocytes %: 14.4 % — ABNORMAL LOW (ref 20.0–42.0)
Lymphocytes Absolute: 3.73 E9/L (ref 1.50–4.00)
MCH: 28.2 pg (ref 26.0–35.0)
MCHC: 31.9 % — ABNORMAL LOW (ref 32.0–34.5)
MCV: 88.3 fL (ref 80.0–99.9)
MPV: 11.8 fL (ref 7.0–12.0)
Monocytes %: 8.5 % (ref 2.0–12.0)
Monocytes Absolute: 2.21 E9/L — ABNORMAL HIGH (ref 0.10–0.95)
Neutrophils %: 75.2 % (ref 43.0–80.0)
Neutrophils Absolute: 19.41 E9/L — ABNORMAL HIGH (ref 1.80–7.30)
Platelets: 149 E9/L (ref 130–450)
RBC: 3.69 E12/L — ABNORMAL LOW (ref 3.80–5.80)
RDW: 16.8 fL — ABNORMAL HIGH (ref 11.5–15.0)
WBC: 25.9 E9/L — ABNORMAL HIGH (ref 4.5–11.5)

## 2020-11-21 LAB — HEMOGLOBIN A1C: Hemoglobin A1C: 5.7 % — ABNORMAL HIGH (ref 4.0–5.6)

## 2020-11-21 LAB — POCT GLUCOSE
Meter Glucose: 147 mg/dL — ABNORMAL HIGH (ref 74–99)
Meter Glucose: 149 mg/dL — ABNORMAL HIGH (ref 74–99)
Meter Glucose: 125 mg/dL — ABNORMAL HIGH (ref 74–99)

## 2020-11-21 LAB — PROCALCITONIN: Procalcitonin: 0.42 ng/mL — ABNORMAL HIGH (ref 0.00–0.08)

## 2020-11-21 LAB — CALCIUM, IONIZED: Calcium, Ionized: 1.23 mmol/L (ref 1.15–1.33)

## 2020-11-21 LAB — MAGNESIUM: Magnesium: 2.1 mg/dL (ref 1.6–2.6)

## 2020-11-21 LAB — PHOSPHORUS: Phosphorus: 1.8 mg/dL — ABNORMAL LOW (ref 2.5–4.5)

## 2020-11-21 MED ORDER — GLUCAGON HCL RDNA (DIAGNOSTIC) 1 MG IJ SOLR
1 MG | INTRAMUSCULAR | Status: DC | PRN
Start: 2020-11-21 — End: 2020-11-23

## 2020-11-21 MED ORDER — DEXTROSE 5 % IV SOLN ADMIXTURE
5 % | INTRAVENOUS | Status: DC
Start: 2020-11-21 — End: 2020-11-23
  Administered 2020-11-22: 16:00:00 via INTRAVENOUS

## 2020-11-21 MED ORDER — DEXTROSE 50 % IV SOLN
50 % | INTRAVENOUS | Status: DC | PRN
Start: 2020-11-21 — End: 2020-11-23

## 2020-11-21 MED ORDER — INSULIN LISPRO 100 UNIT/ML SC SOLN
100 UNIT/ML | SUBCUTANEOUS | Status: DC
Start: 2020-11-21 — End: 2020-11-23
  Administered 2020-11-21 – 2020-11-22 (×6): via SUBCUTANEOUS

## 2020-11-21 MED ORDER — GLUCOSE 40 % PO GEL
40 % | ORAL | Status: DC | PRN
Start: 2020-11-21 — End: 2020-11-23

## 2020-11-21 MED ORDER — DEXTROSE 5 % IV SOLN
5 | INTRAVENOUS | Status: DC | PRN
Start: 2020-11-21 — End: 2020-11-23

## 2020-11-21 MED ORDER — DILTIAZEM HCL 30 MG PO TABS
30 MG | Freq: Four times a day (QID) | ORAL | Status: DC
Start: 2020-11-21 — End: 2020-11-23
  Administered 2020-11-21 – 2020-11-23 (×5): via GASTROSTOMY

## 2020-11-21 MED ORDER — DEXTROSE 5 % IV SOLN
5 % | Freq: Once | INTRAVENOUS | Status: AC
Start: 2020-11-21 — End: 2020-11-21
  Administered 2020-11-21: 16:00:00 via INTRAVENOUS

## 2020-11-21 MED FILL — POLYETHYLENE GLYCOL 3350 17 G PO PACK: 17 g | ORAL | Qty: 1

## 2020-11-21 MED FILL — HUMALOG 100 UNIT/ML SC SOLN: 100 [IU]/mL | SUBCUTANEOUS | Qty: 3

## 2020-11-21 MED FILL — VANCOMYCIN HCL 10 G IV SOLR: 10 g | INTRAVENOUS | Qty: 2000

## 2020-11-21 MED FILL — PANTOPRAZOLE SODIUM 40 MG PO TBEC: 40 mg | ORAL | Qty: 1

## 2020-11-21 MED FILL — DILTIAZEM HCL 30 MG PO TABS: 30 mg | ORAL | Qty: 1

## 2020-11-21 MED FILL — VANCOMYCIN HCL 1 G IV SOLR: 1 g | INTRAVENOUS | Qty: 1000

## 2020-11-21 MED FILL — BD POSIFLUSH 0.9 % IV SOLN: 0.9 % | INTRAVENOUS | Qty: 40

## 2020-11-21 MED FILL — LEVETIRACETAM IN NACL 500 MG/100ML IV SOLN: 500 MG/100ML | INTRAVENOUS | Qty: 100

## 2020-11-21 MED FILL — DILTIAZEM HCL 100 MG IV SOLR: 100 mg | INTRAVENOUS | Qty: 1

## 2020-11-21 MED FILL — MAPAP 325 MG PO TABS: 325 mg | ORAL | Qty: 2

## 2020-11-21 MED FILL — ATORVASTATIN CALCIUM 20 MG PO TABS: 20 mg | ORAL | Qty: 1

## 2020-11-21 MED FILL — BD POSIFLUSH 0.9 % IV SOLN: 0.9 % | INTRAVENOUS | Qty: 150

## 2020-11-21 MED FILL — CEFTRIAXONE SODIUM 1 G IJ SOLR: 1 g | INTRAMUSCULAR | Qty: 1000

## 2020-11-21 NOTE — Progress Notes (Signed)
[facility]  Critical Care  Daily Progress Note 11/21/2020    Date of Admission: 11/18/20  CC:  Critical management of left crani for evac of SDH.       HOSPITAL EVENTS  87 year old male presented??from [place]??with concern for SDH. Working with PT in am patient developed R sided weakness and aphasia.??Reportedly??fell at??the??nursing facility. Hx: ASA and Plavix for CAD. CT scan at [facility] showed??Left-sided subacute on chronic subdural hematoma.  3/2 Left fronto/temporal burr hole and subdural drain placement   Urology consulted for??Left hydronephrosis, left distal ureteral??7 mm??calculi. Vascular consulted for IVF filter placement d/t??possible subsegmental PE.   3/3 Cystoscopy??and??left stent insertion. Vascular placed IVC filter.   3/4 No acute events overnight. Speech worked with patient, NPO/Tube feed.??NSGY going to remove drain today d/t minimal output.??Spoke with Urology; Rocephin x 7 days.   3/9 Patient remained in hospital, working with PT/OT waiting to go to SNF. Conversation with NSGY about patient condition and prognosis.  Family decided to move forward and have L frontal Craniotomy for SDH.   3/10 Patient to OR with neurosurgery for L frontal craniotomy for SDH today. Transfer to ICU for follow up care. Family at the bedside. Repeat CT head 3/11. Lab work sent.   3/11 Decreased UOP overnight.  Bolus 1 L given. Catheter flushed per standing Urology order this am. CT scan - increased pneumocephalus L frontal. Non-rebreather on 2 hr, off 2 hr.   3/12 In AF with RVR. Not responsive to metoprolol.   3/13 No acute issue overnight.     OVERNIGHT EVENTS: T max 98 F.  Did not require additional doses of antihypertensive agents or  insulin in the previous 24 hours.      PHYSICAL EXAM:    BP (!) 104/53    Pulse 94    Temp 99.1 ??F (37.3 ??C) (Axillary)    Resp 26    Ht 6\' 2"  (1.88 m)    Wt 220 lb 3.2 oz (99.9 kg)    SpO2 100%    BMI 28.27 kg/m??     Intake/Output Summary (Last 24 hours) at 11/21/2020  1018  Last data filed at 11/21/2020 0800  Gross per 24 hour   Intake 4499.39 ml   Output 645 ml   Net 3854.39 ml         General appearance:  Comfortable.       NEUROLOGIC:        GCS:    2 - Opens eyes with pain   4 - Moves part of body but does not remove noxious stimulus  1 - Makes no noise       Pupil size:  Left 2 mm  Right 2 mm  Pupil reaction: Yes   PERRLA  Wiggles fingers: Left No Right No  Hand grasp:   Left absent     Right absent  Wiggles toes: Left No    Right No  Plantar flexion: Left absent    Right absent      CONSTITUTIONAL: No acute distress  CARDIOVASCULAR: S1 S2,irregular rate,ir regular rhythm,Monitor:   PULMONARY:  Respirations unlabored. No rhonchi/rales/wheezes.  RENAL:  Foley to gravity, clear yellow urine.    ABDOMEN: Soft, nontender, nondistended, nontympanic, normal bowel sounds. Enteric tube with TF.   MUSCULOSKELETAL: Moves LUE to pain.   SKIN/EXTREMITIES: No rashes/ecchymosis, no edema/clubbing, warm/dry, good capillary refill.     IV ACCESS:   PIV      ASSESSMENT/PLAN:     Principal Problem:  Acute on chronic intracranial subdural hematoma (HCC)  Active Problems:    Single subsegmental pulmonary embolism without acute cor pulmonale (HCC)    Delirium    Slurred speech    Hydronephrosis with urinary obstruction due to ureteral calculus    Pleural effusion    Subdural hemorrhage (HCC)    Palliative care by specialist  Resolved Problems:    * No resolved hospital problems. *          Neuro: Acute on Chronic SDH. S/p Crani for evac of SDH.s/p fall. Neurosurgery following.Monitor neuro status. Keppra.   CV: AF with RVR. Hx CAD. AMI. HLD. BP goal <160 mm Hg. Diltiazem for AF RVR. Start drip.PRN     Pulm:At risk for respiratory distress.  Monitor respiratory status  GI: NPO. Enteric tube with TF. Monitor bowel status  Renal: AKI SCr improving. Gentle hydration. Hypophosphatemia. Replete and supplement.  Monitor uop, renal function and electrolytes  ID: Leukocytosis.worsening. Afebrile.  Urine  culture -no growth. Respiratory panel.  Blood cultures. Empiric Vanc per pharmacy dosing. Rocephin.   Endocrine: Hyperglycemia. ISS.      MSK:.  Deconditioned  Heme: Anemia. Monitor CBC        Bowel regime: Glycolax  Pain control/Sedation:  Acetaminophen  DVT prophylaxis: SCDs.     GI prophylaxis: TF  Glucose protocol:  ISS  Mouth/Eye care: As needed  Foley: Keep in place for critical care monitoring of fluid balance.    Consults: Trauma, Urology, Vascular surgery, Neurosurgery, Palliative.     Patient/Family update: Discussed plan of care with son and daughter in law.  Also discussed with daughter on phone. Questions answered.   Code status:  Limited. DNI      Disposition:  NSICU.      The patient is at significant risk for life-threatening neurological deterioration and death and requires ongoing critical care management. CC time 40 min Critical care time exclusive of teaching and procedures        Georgeann Oppenheim, DNP. APRN-CNP  11/21/2020  10:18 AM

## 2020-11-21 NOTE — Progress Notes (Signed)
Was updated by the patient's nurse that family had some questions. When I arrived at the bedside, family had left.  I called and left a message for the patient's daughter Tyler Rhodes, provided palliative care number for call back.  Will continue to follow.

## 2020-11-21 NOTE — Progress Notes (Signed)
Department of Neurosurgery  Attending Progress Note    CHIEF COMPLAINT:    SUBJECTIVE:  No new events    ROS:    OBJECTIVE  Physical  VITALS:  BP 121/60    Pulse 99    Temp 98 ??F (36.7 ??C) (Axillary)    Resp 23    Ht 6\' 2"  (1.88 m)    Wt 220 lb 3.2 oz (99.9 kg)    SpO2 100%    BMI 28.27 kg/m??   NEUROLOGIC:  Sleepy and difficult to arouse.  No commands.      Data  CBC:   Lab Results   Component Value Date    WBC 22.7 11/20/2020    RBC 4.11 11/20/2020    HGB 11.6 11/20/2020    HCT 36.7 11/20/2020    MCV 89.3 11/20/2020    MCH 28.2 11/20/2020    MCHC 31.6 11/20/2020    RDW 16.2 11/20/2020    PLT 161 11/20/2020    MPV 11.2 11/20/2020     BMP:    Lab Results   Component Value Date    NA 141 11/20/2020    K 3.6 11/20/2020    K 4.1 11/15/2020    CL 107 11/20/2020    CO2 19 11/20/2020    BUN 39 11/20/2020    LABALBU 3.1 11/20/2020    LABALBU 4.5 01/06/2011    CREATININE 2.1 11/20/2020    CALCIUM 8.5 11/20/2020    GFRAA 36 11/20/2020    LABGLOM 30 11/20/2020    GLUCOSE 183 11/20/2020    GLUCOSE 107 01/08/2011     Current Inpatient Medications  Current Facility-Administered Medications: dilTIAZem injection 5 mg, 5 mg, IntraVENous, Once  dilTIAZem 100 mg in dextrose 5 % 100 mL infusion ([employer]), 2.5-15 mg/hr, IntraVENous, Continuous  cefTRIAXone (ROCEPHIN) 1,000 mg in sterile water 10 mL IV syringe, 1,000 mg, IntraVENous, Q24H  sodium chloride flush 0.9 % injection 5-40 mL, 5-40 mL, IntraVENous, 2 times per day  sodium chloride flush 0.9 % injection 5-40 mL, 5-40 mL, IntraVENous, PRN  hydrALAZINE (APRESOLINE) injection 10 mg, 10 mg, IntraVENous, Q10 Min PRN  labetalol (NORMODYNE;TRANDATE) injection 10 mg, 10 mg, IntraVENous, Q10 Min PRN  0.9 % sodium chloride infusion, , IntraVENous, Continuous  levETIRAcetam (KEPPRA) 500 mg/100 mL IVPB, 500 mg, IntraVENous, Q12H  atorvastatin (LIPITOR) tablet 20 mg, 20 mg, Oral, Daily  polyethylene glycol (GLYCOLAX) packet 17 g, 17 g, Oral, Daily  acetaminophen (TYLENOL) tablet 650 mg,  650 mg, Oral, Q4H PRN  pantoprazole (PROTONIX) tablet 40 mg, 40 mg, Oral, QAM AC  bisacodyl (DULCOLAX) suppository 10 mg, 10 mg, Rectal, Daily PRN  isosorbide mononitrate (IMDUR) extended release tablet 120 mg, 120 mg, Oral, Daily  ondansetron (ZOFRAN-ODT) disintegrating tablet 4 mg, 4 mg, Oral, Q8H PRN **OR** ondansetron (ZOFRAN) injection 4 mg, 4 mg, IntraVENous, Q6H PRN    ASSESSMENT AND PLAN:    S/p crani for SDH. Exam is poor .  Updated daughter.  Will continue to treat UTI    02-20-1982, MD

## 2020-11-21 NOTE — Progress Notes (Signed)
Palliative contacted to speak to family at the bedside. Will continue to monitor patient.

## 2020-11-21 NOTE — Progress Notes (Signed)
Pharmacy Consultation Note  (Antibiotic Dosing and Monitoring)    Initial consult date: 11/21/20  Consulting physician/provider: Georgeann Oppenheim, APRN - CNP  Drug: Vancomycin  Indication: sepsis of unknown etiology    Age/  Gender Height Weight IBW  Allergy Information   87 y.o./male 6\' 2"  (188 cm) 220 lb (99.8 kg)     Ideal body weight: 82.2 kg (181 lb 3.5 oz)  Adjusted ideal body weight: 89.3 kg (196 lb 13 oz)   Patient has no known allergies.      Renal Function:  Recent Labs     11/19/20  0200 11/20/20  0330 11/21/20  1000   BUN 22 39* 47*   CREATININE 1.1 2.1* 1.5*       Intake/Output Summary (Last 24 hours) at 11/21/2020 1104  Last data filed at 11/21/2020 1000  Gross per 24 hour   Intake 4529.39 ml   Output 665 ml   Net 3864.39 ml       Vancomycin Monitoring:  Trough:  No results for input(s): VANCOTROUGH in the last 72 hours.  Random:  No results for input(s): VANCORANDOM in the last 72 hours.    Vancomycin Administration Times:  Recent vancomycin administrations                   vancomycin (VANCOCIN) injection (mg) 500 mg Given 11/18/20 1241                Assessment:  ?? Patient is a 87 y.o. male who has been initiated on vancomycin for empiric coverage. Patient has acute on Chronic SDH. S/p Crani for evac of SDH s/p fall  ?? Estimated Creatinine Clearance: 41 mL/min (A) (based on SCr of 1.5 mg/dL (H)).  ?? To dose vancomycin, pharmacy will be utilizing InsightRx calculation software for goal AUC/MIC 400-600 mg/L-hr    Plan:  ?? Will order vancomycin 2000 mg IV x 1 followed by vancomycin 1000 mg every 24 hours  ?? Will check vancomycin levels when appropriate  ?? Will continue to monitor renal function   ?? Clinical pharmacy to follow      95, PharmD  Pharmacy Resident  P: [phone]  11/21/2020 11:04 AM

## 2020-11-21 NOTE — Plan of Care (Signed)
Problem: Pain:  Goal: Pain level will decrease  Description: Pain level will decrease  Outcome: Met This Shift     Problem: Skin Integrity:  Goal: Will show no infection signs and symptoms  Description: Will show no infection signs and symptoms  Outcome: Met This Shift  Goal: Absence of new skin breakdown  Description: Absence of new skin breakdown  Outcome: Met This Shift     Problem: Pain:  Goal: Pain level will decrease  Description: Pain level will decrease  Outcome: Met This Shift     Problem: Nutrition Deficit - Risk of:  Goal: Ability to achieve adequate nutritional intake will improve  Description: Ability to achieve adequate nutritional intake will improve  Outcome: Met This Shift  Goal: Maintenance of adequate weight for body size and type will improve to within specified parameters  Description: Maintenance of adequate weight for body size and type will improve to within specified parameters  Outcome: Met This Shift

## 2020-11-22 LAB — POCT GLUCOSE
Meter Glucose: 144 mg/dL — ABNORMAL HIGH (ref 74–99)
Meter Glucose: 133 mg/dL — ABNORMAL HIGH (ref 74–99)
Meter Glucose: 158 mg/dL — ABNORMAL HIGH (ref 74–99)
Meter Glucose: 161 mg/dL — ABNORMAL HIGH (ref 74–99)
Meter Glucose: 159 mg/dL — ABNORMAL HIGH (ref 74–99)
Meter Glucose: 161 mg/dL — ABNORMAL HIGH (ref 74–99)

## 2020-11-22 LAB — CULTURE, URINE

## 2020-11-22 LAB — CBC WITH AUTO DIFFERENTIAL
Basophils %: 0.4 % (ref 0.0–2.0)
Basophils Absolute: 0 E9/L (ref 0.00–0.20)
Eosinophils %: 0.1 % (ref 0.0–6.0)
Eosinophils Absolute: 0 E9/L — ABNORMAL LOW (ref 0.05–0.50)
Hematocrit: 37 % (ref 37.0–54.0)
Hemoglobin: 11.5 g/dL — ABNORMAL LOW (ref 12.5–16.5)
Lymphocytes %: 14.8 % — ABNORMAL LOW (ref 20.0–42.0)
Lymphocytes Absolute: 4.08 E9/L — ABNORMAL HIGH (ref 1.50–4.00)
MCH: 28.5 pg (ref 26.0–35.0)
MCHC: 31.1 % — ABNORMAL LOW (ref 32.0–34.5)
MCV: 91.8 fL (ref 80.0–99.9)
MPV: 12 fL (ref 7.0–12.0)
Monocytes %: 5.2 % (ref 2.0–12.0)
Monocytes Absolute: 1.36 E9/L — ABNORMAL HIGH (ref 0.10–0.95)
Myelocyte Percent: 0.9 % (ref 0–0)
Neutrophils %: 79.1 % (ref 43.0–80.0)
Neutrophils Absolute: 21.76 E9/L — ABNORMAL HIGH (ref 1.80–7.30)
Platelets: 165 E9/L (ref 130–450)
RBC: 4.03 E12/L (ref 3.80–5.80)
RDW: 16.9 fL — ABNORMAL HIGH (ref 11.5–15.0)
WBC: 27.2 E9/L — ABNORMAL HIGH (ref 4.5–11.5)

## 2020-11-22 LAB — COMPREHENSIVE METABOLIC PANEL
ALT: 21 U/L (ref 0–40)
AST: 42 U/L — ABNORMAL HIGH (ref 0–39)
Albumin: 2.7 g/dL — ABNORMAL LOW (ref 3.5–5.2)
Alkaline Phosphatase: 88 U/L (ref 40–129)
Anion Gap: 9 mmol/L (ref 7–16)
BUN: 48 mg/dL — ABNORMAL HIGH (ref 6–23)
CO2: 21 mmol/L — ABNORMAL LOW (ref 22–29)
Calcium: 8 mg/dL — ABNORMAL LOW (ref 8.6–10.2)
Chloride: 114 mmol/L — ABNORMAL HIGH (ref 98–107)
Creatinine: 1.3 mg/dL — ABNORMAL HIGH (ref 0.7–1.2)
GFR African American: >60
GFR Non-African American: 52 mL/min/{1.73_m2} (ref >=60)
Glucose: 145 mg/dL — ABNORMAL HIGH (ref 74–99)
Potassium: 4.2 mmol/L (ref 3.5–5.0)
Sodium: 144 mmol/L (ref 132–146)
Total Bilirubin: 0.3 mg/dL (ref 0.0–1.2)
Total Protein: 6 g/dL — ABNORMAL LOW (ref 6.4–8.3)

## 2020-11-22 LAB — EKG 12-LEAD
Atrial Rate: 159 {beats}/min
Q-T Interval: 364 ms
QRS Duration: 144 ms
QTc Calculation (Bazett): 529 ms
R Axis: -14 degrees
T Axis: -55 degrees
Ventricular Rate: 127 {beats}/min

## 2020-11-22 LAB — CALCIUM, IONIZED: Calcium, Ionized: 1.27 mmol/L (ref 1.15–1.33)

## 2020-11-22 LAB — PHOSPHORUS: Phosphorus: 1.4 mg/dL — ABNORMAL LOW (ref 2.5–4.5)

## 2020-11-22 LAB — MAGNESIUM: Magnesium: 2.1 mg/dL (ref 1.6–2.6)

## 2020-11-22 MED ORDER — LABETALOL HCL 5 MG/ML IV SOLN
5 MG/ML | INTRAVENOUS | Status: DC | PRN
Start: 2020-11-22 — End: 2020-11-23

## 2020-11-22 MED ORDER — SODIUM PHOSPHATE 3 MMOLE/ML IV SOLN (MIXTURES ONLY)
3 MMOLE/ML | Freq: Once | INTRAVENOUS | Status: AC
Start: 2020-11-22 — End: 2020-11-22
  Administered 2020-11-22: 13:00:00 via INTRAVENOUS

## 2020-11-22 MED ORDER — SODIUM PHOSPHATE 3 MMOL/ML IV SOLN (MIXTURES ONLY)
3 mmol/mL | Freq: Once | INTRAVENOUS | Status: DC
Start: 2020-11-22 — End: 2020-11-22

## 2020-11-22 MED ORDER — HYDRALAZINE HCL 20 MG/ML IJ SOLN
20 MG/ML | INTRAMUSCULAR | Status: DC | PRN
Start: 2020-11-22 — End: 2020-11-23

## 2020-11-22 MED FILL — DILTIAZEM HCL 30 MG PO TABS: 30 mg | ORAL | Qty: 1

## 2020-11-22 MED FILL — LEVETIRACETAM IN NACL 500 MG/100ML IV SOLN: 500 MG/100ML | INTRAVENOUS | Qty: 100

## 2020-11-22 MED FILL — SODIUM PHOSPHATES 45 MMOLE/15ML IV SOLN: 45 MMOLE/15ML | INTRAVENOUS | Qty: 6.67

## 2020-11-22 MED FILL — HUMALOG 100 UNIT/ML SC SOLN: 100 [IU]/mL | SUBCUTANEOUS | Qty: 3

## 2020-11-22 MED FILL — BD POSIFLUSH 0.9 % IV SOLN: 0.9 % | INTRAVENOUS | Qty: 30

## 2020-11-22 MED FILL — POLYETHYLENE GLYCOL 3350 17 G PO PACK: 17 g | ORAL | Qty: 1

## 2020-11-22 MED FILL — SODIUM PHOSPHATES 15 MMOLE/5ML IV SOLN: 15 MMOLE/5ML | INTRAVENOUS | Qty: 6.67

## 2020-11-22 MED FILL — BD POSIFLUSH 0.9 % IV SOLN: 0.9 % | INTRAVENOUS | Qty: 50

## 2020-11-22 MED FILL — ISOSORBIDE MONONITRATE ER 60 MG PO TB24: 60 MG | ORAL | Qty: 2

## 2020-11-22 MED FILL — CEFTRIAXONE SODIUM 1 G IJ SOLR: 1 g | INTRAMUSCULAR | Qty: 1000

## 2020-11-22 MED FILL — ATORVASTATIN CALCIUM 20 MG PO TABS: 20 mg | ORAL | Qty: 1

## 2020-11-22 MED FILL — VANCOMYCIN HCL 1 G IV SOLR: 1 g | INTRAVENOUS | Qty: 1000

## 2020-11-22 NOTE — Progress Notes (Addendum)
Chart reviewed and met with patient, no family was at bedside. Surgery spoke with the patient's daughter Tyler Rhodes.  They are considering comfort care and hospice was consulted.  At this time she wants to continue antibiotics and current medical treatment and for code status to remain as limited: DNRCCA/ DNI. It was noted that she would not want heroric measures taken.  Hospice is going to follow-up with her tomorrow.  No acute PM needs at this time.

## 2020-11-22 NOTE — Progress Notes (Signed)
Speech Language Pathology      NAME:  JOSHUS ROGAN  DOB:  [birth year]  DATE: 11/22/2020  ROOM:  3821/3821-A    Chart reviewed. Pt has not been following commands. Spoke with CNP, will DC from SLP caseload at this time.     Please re-order SLP intervention as appropriate/warranted.    Acute on chronic intracranial subdural hematoma (HCC) [I62.01, I62.03]

## 2020-11-22 NOTE — Progress Notes (Signed)
[facility]     Liaison Information Visit Note              Patient Name: Tyler Rhodes   Admit date:  11/09/2020   Hospital Admitting Physician:  Lorene Dy, MD   PCP:  Fidel Levy, MD  Primary Insurance: Payor: [facility] MEDICARE /  /  /    Emergency Contact:   Merideth Abbey [phone]  Terminal Diagnosis stroke per Tamela Oddi, NP     Current Hospital Problem List:   Patient Active Problem List   Diagnosis Code   ??? Elevated serum cholesterol E78.9   ??? Hyperlipidemia E78.5   ??? Acute MI (HCC) I21.9   ??? Sepsis (HCC) A41.9   ??? 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 Anemia D64.9   ??? Acute on chronic intracranial subdural hematoma (HCC) I62.01, I62.03   ??? Single subsegmental pulmonary embolism without acute cor pulmonale (HCC) I26.93   ??? Delirium R41.0   ??? Slurred speech R47.81   ??? Hydronephrosis with urinary obstruction due to ureteral calculus N13.2   ??? Pleural effusion J90   ??? Subdural hemorrhage (HCC) I62.00   ??? Palliative care by specialist Z51.5     Code Status Order: Limited   Allergies:  Patient has no known allergies.  Family Goal: comfort  Meeting held with spoke with Harriett Sine over the phone  The hospice benefit and philosophy were explained including that hospice is end of life care in which, per Medicare, a patient has a terminal diagnosis that life expectancy would be 6 months or less.  Hospice care is a service that is covered by most insurance plans.  The following levels of hospice care were discussed: routine level of hospice care at private home or [facility]- patient/family are responsible for any room and board fees at the facility, and general in patient level of care (GIP) at the [facility] for short term symptom management.  Per Medicare guidelines, a patient is considered appropriate for GIP if they have uncontrolled symptoms such as pain, agitation, labored breathing or nausea/vomiting that are not being managed with current medication regimen.  Once symptoms become managed, the patient would  need to be moved to a lower level of care such as home with hospice, or ECF with hospice.  Family informed that with the routine level of care at home or ECF,  the hospice team consists of the RN, a Child psychotherapist, and personal care team who make intermittent visits along with optional services of non-medical volunteers and Chaplains.  Explained that at home in routine level of care, familles are responsible for the 24 hour care.      Discussed that under hospice care patient would not receive chemotherapy, radiation, immune therapy, IV antibiotics, dialysis or blood transfusions.  Explained that once in hospice care, all aggressive treatments would be stopped and allow nature to takes its course with focus on comfort care for the patient.    Harriett Sine is currently preparing to drive to [place] from [place].  She will arrive late tonight.  Plan is to meet tomorrow at 10 am to further discuss plans for discharge with hospice.      Discharge Plan:  Discharge Disposition; unknown  HOTV plan:  1.  Will follow up with Harriett Sine tomorrow.  2. Please call HOTV [phone] with any questions.  3. Patient not currently under the care of hospice.    Electronically signed by Irven Shelling, RN on 11/22/2020 at 11:20 AM

## 2020-11-22 NOTE — Plan of Care (Signed)
EKG performed by [facility] staff with no epic order on 11/20/2020.  Perfect served Dr. Melvern Sample place order in care path.  Order placed in on 11/22/2020 .  Spoke to [employer], RN that no repeat needed with this order at this time.

## 2020-11-22 NOTE — Plan of Care (Signed)
Nurse to nurse called for patient transfer to 5414[facility]. Patient's daughter/POA contacted with new room number and updated on plan of care. Patient packed for transport with belongings and meds.

## 2020-11-22 NOTE — Progress Notes (Signed)
Pharmacy Consultation Note  (Antibiotic Dosing and Monitoring)    Initial consult date: 11/21/20  Consulting physician/provider: Georgeann Oppenheim, APRN - CNP  Drug: Vancomycin  Indication: sepsis of unknown etiology    Age/  Gender Height Weight IBW  Allergy Information   87 y.o./male 6\' 2"  (188 cm) 220 lb (99.8 kg)     Ideal body weight: 82.2 kg (181 lb 3.5 oz)  Adjusted ideal body weight: 89.3 kg (196 lb 13 oz)   Patient has no known allergies.      Renal Function:  Recent Labs     11/20/20  0330 11/21/20  1000 11/22/20  0330   BUN 39* 47* 48*   CREATININE 2.1* 1.5* 1.3*       Intake/Output Summary (Last 24 hours) at 11/22/2020 1114  Last data filed at 11/22/2020 1000  Gross per 24 hour   Intake 5353.75 ml   Output 915 ml   Net 4438.75 ml       Vancomycin Monitoring:  Trough:  No results for input(s): VANCOTROUGH in the last 72 hours.  Random:  No results for input(s): VANCORANDOM in the last 72 hours.    Vancomycin Administration Times:  Recent vancomycin administrations                   vancomycin (VANCOCIN) 2,000 mg in dextrose 5 % 500 mL IVPB (mg) 2,000 mg New Bag 11/21/20 1142              Assessment:  ?? Patient is a 87 y.o. male who has been initiated on vancomycin for empiric coverage. Patient has acute on Chronic SDH. S/p Crani for evac of SDH s/p fall  Estimated Creatinine Clearance: 48 mL/min (A) (based on SCr of 1.3 mg/dL (H)).  ?? To dose vancomycin, pharmacy will be utilizing InsightRx calculation software for goal AUC/MIC 400-600 mg/L-hr    Plan:  ?? Continue vancomycin 1000 mg every 24 hours  ?? Will check vancomycin levels when appropriate  ?? Will continue to monitor renal function   ?? Clinical pharmacy to follow      95, PharmD, BCPS 11/22/2020 11:14 AM

## 2020-11-22 NOTE — Progress Notes (Signed)
Department of Neurosurgery  Progress Note    CHIEF COMPLAINT: s/p left burr hole 3/2    SUBJECTIVE:  aphasic    REVIEW OF SYSTEMS :  aphasic    OBJECTIVE:   VITALS:  BP (!) 94/54    Pulse 110    Temp 98.9 ??F (37.2 ??C) (Axillary)    Resp 25    Ht 6\' 2"  (1.88 m)    Wt 220 lb 3.2 oz (99.9 kg)    SpO2 99%    BMI 28.27 kg/m??     PHYSICAL:  Neurologic: lethargic  PERRL  Motor Exam:  not FC  W/D to noxious stimuli      DATA:  CBC:   Lab Results   Component Value Date    WBC 27.2 11/22/2020    RBC 4.03 11/22/2020    HGB 11.5 11/22/2020    HCT 37.0 11/22/2020    MCV 91.8 11/22/2020    MCH 28.5 11/22/2020    MCHC 31.1 11/22/2020    RDW 16.9 11/22/2020    PLT 165 11/22/2020    MPV 12.0 11/22/2020     BMP:    Lab Results   Component Value Date    NA 144 11/22/2020    K 4.2 11/22/2020    K 4.1 11/15/2020    CL 114 11/22/2020    CO2 21 11/22/2020    BUN 48 11/22/2020    LABALBU 2.7 11/22/2020    LABALBU 4.5 01/06/2011    CREATININE 1.3 11/22/2020    CALCIUM 8.0 11/22/2020    GFRAA >60 11/22/2020    LABGLOM 52 11/22/2020    GLUCOSE 145 11/22/2020    GLUCOSE 107 01/08/2011     PT/INR:    Lab Results   Component Value Date    PROTIME 13.3 11/09/2020    PROTIME 13.0 01/06/2011    INR 1.2 11/09/2020     PTT:    Lab Results   Component Value Date    APTT 27.9 11/09/2020   [APTT}    Current Inpatient Medications  Current Facility-Administered Medications: sodium phosphate 20 mmol in dextrose 5 % 500 mL IVPB, 20 mmol, IntraVENous, Once  glucose (GLUTOSE) 40 % oral gel 15 g, 15 g, Oral, PRN  dextrose 50 % IV solution, 12.5 g, IntraVENous, PRN  glucagon (rDNA) injection 1 mg, 1 mg, IntraMUSCular, PRN  dextrose 5 % solution, 100 mL/hr, IntraVENous, PRN  insulin lispro (HUMALOG) injection vial 0-18 Units, 0-18 Units, SubCUTAneous, Q4H  [COMPLETED] vancomycin (VANCOCIN) 2,000 mg in dextrose 5 % 500 mL IVPB, 2,000 mg, IntraVENous, Once **FOLLOWED BY** vancomycin 1000 mg IVPB in 250 mL D5W addavial, 1,000 mg, IntraVENous, Q24H  dilTIAZem  (CARDIZEM) tablet 30 mg, 30 mg, Per G Tube, 4 times per day  dilTIAZem injection 5 mg, 5 mg, IntraVENous, Once  cefTRIAXone (ROCEPHIN) 1,000 mg in sterile water 10 mL IV syringe, 1,000 mg, IntraVENous, Q24H  sodium chloride flush 0.9 % injection 5-40 mL, 5-40 mL, IntraVENous, 2 times per day  sodium chloride flush 0.9 % injection 5-40 mL, 5-40 mL, IntraVENous, PRN  hydrALAZINE (APRESOLINE) injection 10 mg, 10 mg, IntraVENous, Q10 Min PRN  labetalol (NORMODYNE;TRANDATE) injection 10 mg, 10 mg, IntraVENous, Q10 Min PRN  0.9 % sodium chloride infusion, , IntraVENous, Continuous  levETIRAcetam (KEPPRA) 500 mg/100 mL IVPB, 500 mg, IntraVENous, Q12H  atorvastatin (LIPITOR) tablet 20 mg, 20 mg, Oral, Daily  polyethylene glycol (GLYCOLAX) packet 17 g, 17 g, Oral, Daily  acetaminophen (TYLENOL) tablet 650 mg, 650 mg, Oral, Q4H  PRN  bisacodyl (DULCOLAX) suppository 10 mg, 10 mg, Rectal, Daily PRN  isosorbide mononitrate (IMDUR) extended release tablet 120 mg, 120 mg, Oral, Daily  ondansetron (ZOFRAN-ODT) disintegrating tablet 4 mg, 4 mg, Oral, Q8H PRN **OR** ondansetron (ZOFRAN) injection 4 mg, 4 mg, IntraVENous, Q6H PRN    ASSESSMENT:   s/p left burr hole 3/2,   Left craniotomy on 3/10, poor clinical exam      PLAN:  -HOB to comfort  -Serial neurological exams  -No AC or AP  -no further intervention planned      Electronically signed by Duffy Bruce, PA on 11/22/2020 at 12:11 PM

## 2020-11-22 NOTE — Progress Notes (Signed)
Patient transport called regarding blood cultures.

## 2020-11-22 NOTE — Progress Notes (Signed)
Intensive Care Unit  Critical Care Consult  Daily Progress Note 11/22/2020    Date of Admission: 11/09/2020  Admission to ICU: 11/18/2020    CC: Critical Care after L Frontal Craniotomy for [facility]    Hospital Course/ Overnight Events:  87 year old male presented from [facility] with concern for SDH. Working with PT in am patient developed R sided weakness and aphasia.??Reportedly fell at the nursing facility. Hx: ASA and Plavix for CAD. CT scan at [facility] showed Left-sided subacute on chronic subdural hematoma.  3/2 Left fronto/temporal burr hole and subdural drain placement   Urology consulted for Left hydronephrosis, left distal ureteral 7 mm calculi. Vascular consulted for IVF filter placement d/t possible subsegmental PE.   3/3 Cystoscopy and left stent insertion. Vascular placed IVC filter.   3/4 No acute events overnight. Speech worked with patient, NPO/Tube feed. NSGY going to remove drain today d/t minimal output. Spoke with Urology; Rocephin x 7 days.   3/9 Patient remained in hospital, working with PT/OT waiting to go to SNF. Conversation with NSGY about patient condition and prognosis.  Family decided to move forward and have L frontal Craniotomy for SDH.   3/10 Patient to OR with neurosurgery for L frontal craniotomy for SDH today. Transfer to ICU for follow up care. Family at the bedside. Repeat CT head 3/11. Lab work sent.   3/11 Decreased UOP overnight.  Bolus 1 L given. Catheter flushed per standing Urology order this am. CT scan - increased pneumocephalus L frontal. Non-rebreather on 2 hr, off 2 hr.   3/14 Called son Gerlene Burdock. Went over his father's health status. Consent given to contact [facility]. Called HC-POA Harriett Sine- Patient to remain a DNR-CCA/DNI. Harriett Sine would like him to receive his antibiotic. Ok to move out of unit,Hospice to assess patient.     PHYSICAL EXAM:    BP 133/64    Pulse 104    Temp 99.1 ??F (37.3 ??C) (Axillary)    Resp 25    Ht 6\' 2"  (1.88 m)    Wt 220 lb 3.2 oz (99.9 kg)    SpO2 99%    BMI 28.27  kg/m??     Intake/Output Summary (Last 24 hours) at 11/22/2020 0917  Last data filed at 11/22/2020 0700  Gross per 24 hour   Intake 5193.75 ml   Output 885 ml   Net 4308.75 ml        General appearance:  Comfortable.       GCS: 7  1 - Does not open eyes   4 - Moves part of body but does not remove noxious stimulus  2 - Moans, makes unintelligible sounds     Pupil size:  Left 61mm   Right 4 mm  Pupil reaction: PERRLA  Wiggles fingers: Left No Right No  Hand grasp:   Left no    Right no  Wiggles toes: Left No    Right No  Plantar flexion: Left no   Right no      CONSTITUTIONAL: No acute distress, lying in hospital bed.   NEUROLOGIC: PERRL  CARDIOVASCULAR: S1 S2, regular rate, regular rhythm, no murmur/gallop/rub. Monitor: SR/ST  PULMONARY: Bilaterally clear, diminished. No rhonchi/rales/wheezes, no use of accessory muscles. Room Air. , Non-rebreather for pneumocephalus  RENAL:Foley to gravity, dark yellow urine- flushed per order, Urology notified  ABDOMEN: soft, nontender, nondistended, nontympanic, normal bowel sounds. Failed speech eval; Corpak- TF to   MUSCULOSKELETAL: does not moves extremities purposefully  SKIN/EXTREMITIES: no rashes/ecchymosis, no edema/clubbing, warm/dry, good capillary refill  LINES:    PIV x 2     CONSULTS:   Neurosurgery    Past Medical History:   Diagnosis Date   ??? Acute MI (HCC)     pt had mi 1997   ??? CAD (coronary artery disease)    ??? Cholesterol serum increased    ??? Hyperlipidemia    ??? Prostate cancer (HCC)    ??? Prostate cancer (HCC)        ASSESSMENT/PLAN:       ?? Neuro: L Frontal Craniotomy for SDH. 3/2 L frontal temoporal burr hole [id number] subacute on chronic subdural hematoma. Monitor neuro status, Neurosurgery following, Keppra for seizure prophylaxis.   ?? CV: No acute issues. Hx of Hyperlipidemia, CAD and MI.Monitor hemodynamics. BP goal less than 140. PRN hydralazine & labetalol. Statin.  ?? Pulm: No acute issues.  Monitor RR & SpO2.   ?? GI: Dysphagia. Hx: NGT, modified  diet. NPO. Corpak- TF.  Monitor bowel function. Speech re- evaluation; failed  ?? Renal: 3/3 Left hydronephrosis, left distal ureteral calculi s/p Cystoscopy and L stent insertion. Urology following. Monitor BUN & Cr. Monitor electrolytes & replace as needed.  Monitor urine output.   ?? ID: No acute issues. Monitor WBC. Ancef-completed.  ?? Endocrine: No acute issues. Monitor BS.   ?? MSK: No acute issues. ROM. turn & reposition. PT/OT when able   ?? Heme: No acute issues. Hx: Prostate CA. Monitor CBC.        Bowel regimen: Glycolax  Pain control/Sedation: Morphine PRN, Oxycodone and Tylenol  DVT prophylaxis: SCD  GI prophylaxis: Protonix  Foley: Keep in place for critical care monitoring of fluid balance.  Urology following.   Ancillary consults:  Neurosurgery and Palliative medicine, Urology   Patient/Family update: Family to meet and have serious discussion today. Will update when available.   Code status:  [facility] /DNI     Disposition: ok to transfer    Electronically signed by Carlyle Lipa, APRN - CNP on 11/22/2020 at 9:17 AM

## 2020-11-22 NOTE — Plan of Care (Signed)
[facility] called per family request to initiate hospice services consult. Daughter/POA Nancy's contact information given to facility for requisition.

## 2020-11-23 LAB — POCT GLUCOSE
Meter Glucose: 107 mg/dL — ABNORMAL HIGH (ref 74–99)
Meter Glucose: 108 mg/dL — ABNORMAL HIGH (ref 74–99)

## 2020-11-23 LAB — CBC WITH AUTO DIFFERENTIAL
Atypical Lymphocytes Relative: 0.9 % (ref 0.0–4.0)
Basophils %: 0.4 % (ref 0.0–2.0)
Basophils Absolute: 0 E9/L (ref 0.00–0.20)
Eosinophils %: 0.8 % (ref 0.0–6.0)
Eosinophils Absolute: 0.22 E9/L (ref 0.05–0.50)
Hematocrit: 32 % — ABNORMAL LOW (ref 37.0–54.0)
Hemoglobin: 10.2 g/dL — ABNORMAL LOW (ref 12.5–16.5)
Lymphocytes %: 14.8 % — ABNORMAL LOW (ref 20.0–42.0)
Lymphocytes Absolute: 4.5 E9/L — ABNORMAL HIGH (ref 1.50–4.00)
MCH: 28.4 pg (ref 26.0–35.0)
MCHC: 31.9 % — ABNORMAL LOW (ref 32.0–34.5)
MCV: 89.1 fL (ref 80.0–99.9)
MPV: 11.6 fL (ref 7.0–12.0)
Monocytes %: 3.5 % (ref 2.0–12.0)
Monocytes Absolute: 1.12 E9/L — ABNORMAL HIGH (ref 0.10–0.95)
Myelocyte Percent: 0.9 % (ref 0–0)
Neutrophils %: 79.1 % (ref 43.0–80.0)
Neutrophils Absolute: 22.48 E9/L — ABNORMAL HIGH (ref 1.80–7.30)
Platelets: 169 E9/L (ref 130–450)
RBC: 3.59 E12/L — ABNORMAL LOW (ref 3.80–5.80)
RDW: 16.8 fL — ABNORMAL HIGH (ref 11.5–15.0)
WBC: 28.1 E9/L — ABNORMAL HIGH (ref 4.5–11.5)

## 2020-11-23 LAB — COMPREHENSIVE METABOLIC PANEL
ALT: 40 U/L (ref 0–40)
AST: 60 U/L — ABNORMAL HIGH (ref 0–39)
Albumin: 2.6 g/dL — ABNORMAL LOW (ref 3.5–5.2)
Alkaline Phosphatase: 80 U/L (ref 40–129)
Anion Gap: 12 mmol/L (ref 7–16)
BUN: 55 mg/dL — ABNORMAL HIGH (ref 6–23)
CO2: 20 mmol/L — ABNORMAL LOW (ref 22–29)
Calcium: 8.1 mg/dL — ABNORMAL LOW (ref 8.6–10.2)
Chloride: 116 mmol/L — ABNORMAL HIGH (ref 98–107)
Creatinine: 1.4 mg/dL — ABNORMAL HIGH (ref 0.7–1.2)
GFR African American: 58
GFR Non-African American: 48 mL/min/{1.73_m2} (ref >=60)
Glucose: 115 mg/dL — ABNORMAL HIGH (ref 74–99)
Potassium: 4.8 mmol/L (ref 3.5–5.0)
Sodium: 148 mmol/L — ABNORMAL HIGH (ref 132–146)
Total Bilirubin: 0.3 mg/dL (ref 0.0–1.2)
Total Protein: 6.1 g/dL — ABNORMAL LOW (ref 6.4–8.3)

## 2020-11-23 LAB — COVID-19, RAPID: SARS-CoV-2, NAAT: NOT DETECTED

## 2020-11-23 MED ORDER — MORPHINE SULFATE 20 MG/5ML PO SOLN
20 | ORAL | 0 refills | 5.50000 days | Status: AC | PRN
Start: 2020-11-23 — End: 2020-11-30

## 2020-11-23 MED ORDER — LEVETIRACETAM 500 MG PO TABS
500 MG | Freq: Two times a day (BID) | ORAL | Status: DC
Start: 2020-11-23 — End: 2020-11-23

## 2020-11-23 MED ORDER — GLYCOPYRROLATE 2 MG PO TABS
2 | ORAL_TABLET | ORAL | 0 refills | Status: AC | PRN
Start: 2020-11-23 — End: ?

## 2020-11-23 MED ORDER — LORAZEPAM 1 MG PO TABS
1 | ORAL_TABLET | Freq: Four times a day (QID) | ORAL | 1 refills | Status: AC | PRN
Start: 2020-11-23 — End: 2020-12-23

## 2020-11-23 MED FILL — BD POSIFLUSH 0.9 % IV SOLN: 0.9 % | INTRAVENOUS | Qty: 10

## 2020-11-23 MED FILL — LEVETIRACETAM IN NACL 500 MG/100ML IV SOLN: 500 MG/100ML | INTRAVENOUS | Qty: 100

## 2020-11-23 NOTE — Progress Notes (Signed)
Call to patient's son Luan Pulling, he and his sister Harriett Sine who is MPOA are at the bedside. We reviewed hospice philosophy and services. They would like patient to transfer to [facility] [place] so that he can be near his wife, but concerns for keppra stopping. They have concerns for seizure activity and we discussed medications to reduce the likelihood of seizure activity in an outpatient setting.  Patient appears to have labored breathing using abdominal muscles, and we reviewed transition to hospice house for GIP admission for symptom management of dyspnea.   They also would like to discuss with Dr Rosalene Billings patient's condition prior to making any further decisions.   I will speak with LSW Larita Fife, and continue to follow.  If patient returns to [facility] with [facility] hospice services he will need written prescriptions for   Ativan 1mg  tablet Q 6 hours po/sl for seizure activity  Morphine sulfate 20mg /ml(conc) 5mg  po/sl PRN for pain or dyspnea  Robinul 2mg  tablets Q 4 hours PRN secretions or congestion.    Thank you,  RN [phone]

## 2020-11-23 NOTE — Progress Notes (Signed)
Pharmacy Consultation Note  (Antibiotic Dosing and Monitoring)    Initial consult date: 11/21/20  Consulting physician/provider: Georgeann Oppenheim, APRN - CNP  Drug: Vancomycin  Indication: sepsis of unknown etiology    Age/  Gender Height Weight IBW  Allergy Information   87 y.o./male 6\' 2"  (188 cm) 220 lb (99.8 kg)     Ideal body weight: 82.2 kg (181 lb 3.5 oz)  Adjusted ideal body weight: 89.3 kg (196 lb 13 oz)   Patient has no known allergies.      Renal Function:  Recent Labs     11/21/20  1000 11/22/20  0330 11/23/20  0457   BUN 47* 48* 55*   CREATININE 1.5* 1.3* 1.4*       Intake/Output Summary (Last 24 hours) at 11/23/2020 0948  Last data filed at 11/23/2020 0500  Gross per 24 hour   Intake 2357 ml   Output 1095 ml   Net 1262 ml       Vancomycin Monitoring:  Trough:  No results for input(s): VANCOTROUGH in the last 72 hours.  Random:  No results for input(s): VANCORANDOM in the last 72 hours.    Vancomycin Administration Times:  Recent vancomycin administrations                   vancomycin 1000 mg IVPB in 250 mL D5W addavial (mg) 1,000 mg New Bag 11/22/20 1143    vancomycin (VANCOCIN) 2,000 mg in dextrose 5 % 500 mL IVPB (mg) 2,000 mg New Bag 11/21/20 1142              Assessment:  ?? Patient is a 87 y.o. male who has been initiated on vancomycin for empiric coverage. Patient has acute on Chronic SDH. S/p Crani for evac of SDH s/p fall  Estimated Creatinine Clearance: 44 mL/min (A) (based on SCr of 1.4 mg/dL (H)).  ?? To dose vancomycin, pharmacy will be utilizing Insight[employer] calculation software for goal AUC/MIC 400-600 mg/L-hr   ?? 3/15: WBC elevated at 28.1; Scr 1.4 mg/dL; afebrile    Plan:  ?? Continue vancomycin 1000 mg every 24 hours for now  ?? Will check vancomycin levels when appropriate  ?? Will continue to monitor renal function   ?? Clinical pharmacy to follow      4/15, PharmD  Pharmacy Resident  P: [phone]  11/23/2020 9:48 AM

## 2020-11-23 NOTE — Care Coordination-Inpatient (Signed)
Care Coordination:  Following for discharge plan.  Family met with [facility] this am .  The plan is for patient to return to [facility] [place] under Hospice care.  Discharge order noted.  Physician ambulance pick up scheduled for 1 pm.  COVID test to RN.  Facility and family aware of transport time.

## 2020-11-23 NOTE — Progress Notes (Signed)
Department of Neurosurgery  Progress Note    CHIEF COMPLAINT: s/p left burr hole 3/2    SUBJECTIVE:  aphasic    REVIEW OF SYSTEMS :  aphasic    OBJECTIVE:   VITALS:  BP (!) 146/71    Pulse 86    Temp 98 ??F (36.7 ??C)    Resp 24    Ht 6\' 2"  (1.88 m)    Wt 220 lb 3.2 oz (99.9 kg)    SpO2 100%    BMI 28.27 kg/m??     PHYSICAL:  Neurologic: lethargic  PERRL  Motor Exam:  not FC  W/D to noxious stimuli      DATA:  CBC:   Lab Results   Component Value Date    WBC 28.1 11/23/2020    RBC 3.59 11/23/2020    HGB 10.2 11/23/2020    HCT 32.0 11/23/2020    MCV 89.1 11/23/2020    MCH 28.4 11/23/2020    MCHC 31.9 11/23/2020    RDW 16.8 11/23/2020    PLT 169 11/23/2020    MPV 11.6 11/23/2020     BMP:    Lab Results   Component Value Date    NA 148 11/23/2020    K 4.8 11/23/2020    K 4.1 11/15/2020    CL 116 11/23/2020    CO2 20 11/23/2020    BUN 55 11/23/2020    LABALBU 2.6 11/23/2020    LABALBU 4.5 01/06/2011    CREATININE 1.4 11/23/2020    CALCIUM 8.1 11/23/2020    GFRAA 58 11/23/2020    LABGLOM 48 11/23/2020    GLUCOSE 115 11/23/2020    GLUCOSE 107 01/08/2011     PT/INR:    Lab Results   Component Value Date    PROTIME 13.3 11/09/2020    PROTIME 13.0 01/06/2011    INR 1.2 11/09/2020     PTT:    Lab Results   Component Value Date    APTT 27.9 11/09/2020   [APTT}    Current Inpatient Medications  Current Facility-Administered Medications: levETIRAcetam (KEPPRA) tablet 500 mg, 500 mg, Oral, BID  hydrALAZINE (APRESOLINE) injection 10 mg, 10 mg, IntraVENous, Q4H PRN  labetalol (NORMODYNE;TRANDATE) injection 10 mg, 10 mg, IntraVENous, Q4H PRN  glucose (GLUTOSE) 40 % oral gel 15 g, 15 g, Oral, PRN  dextrose 50 % IV solution, 12.5 g, IntraVENous, PRN  glucagon (rDNA) injection 1 mg, 1 mg, IntraMUSCular, PRN  dextrose 5 % solution, 100 mL/hr, IntraVENous, PRN  insulin lispro (HUMALOG) injection vial 0-18 Units, 0-18 Units, SubCUTAneous, Q4H  [COMPLETED] vancomycin (VANCOCIN) 2,000 mg in dextrose 5 % 500 mL IVPB, 2,000 mg, IntraVENous, Once  **FOLLOWED BY** vancomycin 1000 mg IVPB in 250 mL D5W addavial, 1,000 mg, IntraVENous, Q24H  dilTIAZem (CARDIZEM) tablet 30 mg, 30 mg, Per G Tube, 4 times per day  cefTRIAXone (ROCEPHIN) 1,000 mg in sterile water 10 mL IV syringe, 1,000 mg, IntraVENous, Q24H  sodium chloride flush 0.9 % injection 5-40 mL, 5-40 mL, IntraVENous, 2 times per day  sodium chloride flush 0.9 % injection 5-40 mL, 5-40 mL, IntraVENous, PRN  0.9 % sodium chloride infusion, , IntraVENous, Continuous  atorvastatin (LIPITOR) tablet 20 mg, 20 mg, Oral, Daily  polyethylene glycol (GLYCOLAX) packet 17 g, 17 g, Oral, Daily  acetaminophen (TYLENOL) tablet 650 mg, 650 mg, Oral, Q4H PRN  bisacodyl (DULCOLAX) suppository 10 mg, 10 mg, Rectal, Daily PRN  isosorbide mononitrate (IMDUR) extended release tablet 120 mg, 120 mg, Oral, Daily  ondansetron (ZOFRAN-ODT) disintegrating  tablet 4 mg, 4 mg, Oral, Q8H PRN **OR** ondansetron (ZOFRAN) injection 4 mg, 4 mg, IntraVENous, Q6H PRN    ASSESSMENT:   s/p left burr hole 3/2,   Left craniotomy on 3/10, poor clinical exam      PLAN:  -HOB to comfort  -Serial neurological exams  -No AC or AP  -no further intervention planned      Electronically signed by Duffy Bruce, PA on 11/23/2020 at 11:02 AM

## 2020-11-23 NOTE — Progress Notes (Addendum)
TRAUMA SURGERY  ATTENDING PROGRESS NOTE      CC: fall    S:   non responsive withdraws to pain,     O:   @BP  (!) 146/71    Pulse 86    Temp 98 ??F (36.7 ??C)    Resp 24    Ht 6\' 2"  (1.88 m)    Wt 220 lb 3.2 oz (99.9 kg)    SpO2 100%    BMI 28.27 kg/m?? @    Gen - non responsive,   Neuro - eyes closed, withdraws to pain, makes some groaning noises,     HEENT - PERRL 2 on left 3 on right L crani incision CDI,   Lungs - non labored, course b/s b/l    Heart - RR no extra heart sounds    Abdomen - flat soft  Ext- NVI    A/P: s/p Fall       Principal Problem:    Acute on chronic intracranial subdural hematoma (HCC)  Active Problems:    Single subsegmental pulmonary embolism without acute cor pulmonale (HCC)    Delirium    Slurred speech    Hydronephrosis with urinary obstruction due to ureteral calculus    Pleural effusion    Subdural hemorrhage (HCC)    Palliative care by specialist  Resolved Problems:    * No resolved hospital problems. *      ICH, s/p crani, poor prognosis NS recs appreciated,   3/3 Left hydronephrosis, left distal ureteral calculi??s/p Cystoscopy??and L??stent insertion. Urology following.... ABX vanc ceftriaxone   Pain control SMI deep breathing   Home meds  TFs, BS control SSI,   palliative following,       DVT prophylaxis: SCDs  See d/c summary     MD FACS

## 2020-11-23 NOTE — Discharge Instructions (Addendum)
Continuity of Care Form    Patient Name: Tyler Rhodes   DOB:  1929-03-10  MRN:  [id number]    Admit date:  11/09/2020  Discharge date:  ***    Code Status Order: Limited   Advance Directives:      Admitting Physician:  Lorene Dy, MD  PCP: Fidel Levy, MD    Discharging Nurse: [facility] Unit/Room#: 5414/5414-A  Discharging Unit Phone Number: ***    Emergency Contact:   Extended Emergency Contact Information  Primary Emergency Contact: hesters, nancy  Mobile Phone: [phone]  Relation: None  Secondary Emergency Contact: Carrol, richard  Mobile Phone: [phone]  Relation: None    Past Surgical History:  Past Surgical History:   Procedure Laterality Date    BLADDER SURGERY N/A 11/11/2020    CYSTOSCOPY RETROGRADE PYELOGRAM LEFT STENT INSERTION performed by Jacki Cones, MD at [facility] OR    CARDIAC SURGERY      CORONARY ANGIOPLASTY      pt had a triple by pass 1997    CORONARY ANGIOPLASTY      pt had cat 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    CRANIOTOMY Left 11/10/2020    LEFT CRANIOTOMY BURR HOLESFOR SUBDURAL performed by Rupert Stacks, MD at [facility] OR    CRANIOTOMY Left 11/18/2020    LEFT FRONTAL CRANIOTOMY FOR SUBDURAL HEMATOMA performed by Rupert Stacks, MD at [facility] OR    VENA CAVA FILTER PLACEMENT N/A 11/11/2020    INFERIOR VENA CAVA FILTER PLACEMENT performed by Louie Boston, MD at [facility] OR       Immunization History:   Immunization History   Administered Date(s) Administered    COVID-19, [employer] top, DILUTE for use, 12+ yrs, 49mcg/0.3mL dose 10/23/2019, 11/18/2019       Active Problems:  Patient Active Problem List   Diagnosis Code    Elevated serum cholesterol E78.9    Hyperlipidemia E78.5    Acute MI (HCC) I21.9    Sepsis (HCC) A41.9    Arthritis of knee, 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    Anemia D64.9    Acute on chronic intracranial subdural hematoma (HCC) I62.01, I62.03    Single subsegmental pulmonary embolism without acute cor pulmonale (HCC) I26.93    Delirium R41.0    Slurred speech R47.81     Hydronephrosis with urinary obstruction due to ureteral calculus N13.2    Pleural effusion J90    Subdural hemorrhage (HCC) I62.00    Palliative care by specialist Z51.5       Isolation/Infection:   Isolation            No Isolation          Patient Infection Status       Infection Onset Added Last Indicated Last Indicated By Review Planned Expiration Resolved Resolved By    None active    Resolved    COVID-19 (Rule Out) 11/21/20 11/21/20 11/21/20 Respiratory Panel, Molecular, with COVID-19 (Restricted: peds pts or suitable admitted adults) (Ordered)   11/21/20 Rule-Out Test Resulted            Nurse Assessment:  Last Vital Signs: BP (!) 146/71    Pulse 86    Temp 98 ??F (36.7 ??C)    Resp 24    Ht 6\' 2"  (1.88 m)    Wt 220 lb 3.2 oz (99.9 kg)    SpO2 100%    BMI 28.27 kg/m??     Last documented pain score (0-10 scale): Pain Level: 0  Last Weight:   Wt Readings from Last 1 Encounters:   11/11/20 220 lb 3.2 oz (99.9 kg)     Mental Status:  {IP PT MENTAL STATUS:20030}    IV Access:  {MH COC IV ACCESS:304088262}    Nursing Mobility/ADLs:  Walking   {CHP DME OMVE:720947096}  Transfer  {CHP DME GEZM:629476546}  Bathing  {CHP DME TKPT:465681275}  Dressing  {CHP DME TZGY:174944967}  Toileting  {CHP DME RFFM:384665993}  Feeding  {CHP DME TTSV:779390300}  Med Admin  {CHP DME PQZR:007622633}  Med Delivery   {MH COC MED Delivery:304088264}    Wound Care Documentation and Therapy:  Puncture 11/11/20 Groin Anterior;Right (Active)   Dressing/Treatment Adhesive bandage 11/22/20 1400   Number of days: 12        Elimination:  Continence:   Bowel: {YES / HL:45625}  Bladder: {YES / WL:89373}  Urinary Catheter: {Urinary Catheter:304088013}   Colostomy/Ileostomy/Ileal Conduit: {YES / SK:87681}       Date of Last BM: ***    Intake/Output Summary (Last 24 hours) at 11/23/2020 1142  Last data filed at 11/23/2020 1140  Gross per 24 hour   Intake 2357 ml   Output 1490 ml   Net 867 ml     I/O last 3 completed shifts:  In: 4494.8 [I.V.:1881.8;  NG/GT:1613; IV Piggyback:1000]  Out: 1610 [Urine:1610]    Safety Concerns:     {MH COC Safety Concerns:304088272}    Impairments/Disabilities:      {MH COC Impairments/Disabilities:304088273}    Nutrition Therapy:  Current Nutrition Therapy:   {MH COC Diet List:304088271}    Routes of Feeding: {CHP DME Other Feedings:304088042}  Liquids: {Slp liquid thickness:30034}  Daily Fluid Restriction: {CHP DME Yes amt example:304088041}  Last Modified Barium Swallow with Video (Video Swallowing Test): {Done Not Done LXBW:620355974}    Treatments at the Time of Hospital Discharge:   Respiratory Treatments: ***  Oxygen Therapy:  {Therapy; copd oxygen:17808}  Ventilator:    {MH CC Vent BULA:453646803}    Rehab Therapies: {THERAPEUTIC [phone]}  Weight Bearing Status/Restrictions: {MH CC Weight Bearing:304508812}  Other Medical Equipment (for information only, NOT a DME order):  {EQUIPMENT:304520077}  Other Treatments: ***    Patient's personal belongings (please select all that are sent with patient):  {CHP DME Belongings:304088044}    RN SIGNATURE:  {Esignature:304088025}    CASE MANAGEMENT/SOCIAL WORK SECTION    Inpatient Status Date: ***    Readmission Risk Assessment Score:  Readmission Risk              Risk of Unplanned Readmission:  22           Discharging to Facility/ Agency   Name:   Address:  Phone:  Fax:    Dialysis Facility (if applicable)   Name:  Address:  Dialysis Schedule:  Phone:  Fax:    Case Manager/Social Worker signature: {Esignature:304088025}    PHYSICIAN SECTION    Prognosis: {Prognosis:[phone]}    Condition at Discharge: {MH Patient Condition:304088024}    Rehab Potential (if transferring to Rehab): {Prognosis:[phone]}    Recommended Labs or Other Treatments After Discharge: Hospice Care    Physician Certification: I certify the above information and transfer of Tyler Rhodes  is necessary for the continuing treatment of the diagnosis listed and that he requires Hospice for less 30  days.     Update Admission H&P: Changes in H&P as follows - See discharge Summary    PHYSICIAN SIGNATURE:  {Esignature:304088025}

## 2020-11-23 NOTE — Discharge Instructions (Addendum)
Good nutrition is important when healing from an illness, injury, or surgery.  Follow any nutrition recommendations given to you during your hospital stay.   If you were given an oral nutrition supplement while in the hospital, continue to take this supplement at home.  You can take it with meals, in-between meals, and/or before bedtime. These supplements can be purchased at most local grocery stores, pharmacies, and chain super-stores.   If you have any questions about your diet or nutrition, call the hospital and ask for the dietitian.

## 2020-11-25 ENCOUNTER — Encounter: Attending: Physician Assistant | Primary: Internal Medicine

## 2020-11-26 LAB — CULTURE, BLOOD 2: Culture, Blood 2: 5

## 2020-11-26 LAB — CULTURE, BLOOD 1: Blood Culture, Routine: 5

## 2020-12-03 ENCOUNTER — Encounter: Attending: Physician Assistant | Primary: Internal Medicine

## 2020-12-09 ENCOUNTER — Encounter: Attending: Physician Assistant | Primary: Internal Medicine

## 2020-12-10 DEATH — deceased

## 2020-12-13 ENCOUNTER — Encounter: Attending: Vascular Surgery | Primary: Internal Medicine

## 2020-12-17 ENCOUNTER — Ambulatory Visit: Payer: MEDICARE | Primary: Internal Medicine

## 2020-12-17 ENCOUNTER — Encounter: Attending: Neurological Surgery | Primary: Internal Medicine

## 2021-02-11 ENCOUNTER — Encounter: Attending: Physician Assistant | Primary: Internal Medicine
